# Patient Record
Sex: Female | Born: 1950
Health system: Southern US, Community
[De-identification: ages and names within clinical notes are randomized; demographics above are authoritative.]

## PROBLEM LIST (undated history)

## (undated) DIAGNOSIS — I428 Other cardiomyopathies: Secondary | ICD-10-CM

## (undated) DIAGNOSIS — E119 Type 2 diabetes mellitus without complications: Secondary | ICD-10-CM

## (undated) DIAGNOSIS — R002 Palpitations: Secondary | ICD-10-CM

## (undated) DIAGNOSIS — N183 Chronic kidney disease, stage 3 unspecified: Secondary | ICD-10-CM

## (undated) DIAGNOSIS — J309 Allergic rhinitis, unspecified: Secondary | ICD-10-CM

## (undated) DIAGNOSIS — E0500 Thyrotoxicosis with diffuse goiter without thyrotoxic crisis or storm: Secondary | ICD-10-CM

## (undated) DIAGNOSIS — E669 Obesity, unspecified: Secondary | ICD-10-CM

## (undated) DIAGNOSIS — G5600 Carpal tunnel syndrome, unspecified upper limb: Secondary | ICD-10-CM

## (undated) DIAGNOSIS — M1990 Unspecified osteoarthritis, unspecified site: Secondary | ICD-10-CM

## (undated) DIAGNOSIS — E785 Hyperlipidemia, unspecified: Secondary | ICD-10-CM

## (undated) DIAGNOSIS — I509 Heart failure, unspecified: Secondary | ICD-10-CM

## (undated) DIAGNOSIS — E0590 Thyrotoxicosis, unspecified without thyrotoxic crisis or storm: Secondary | ICD-10-CM

## (undated) DIAGNOSIS — I10 Essential (primary) hypertension: Secondary | ICD-10-CM

## (undated) DIAGNOSIS — I493 Ventricular premature depolarization: Secondary | ICD-10-CM

## (undated) HISTORY — DX: Essential (primary) hypertension: I10

## (undated) HISTORY — DX: Ventricular premature depolarization: I49.3

## (undated) HISTORY — DX: Carpal tunnel syndrome, unspecified upper limb: G56.00

## (undated) HISTORY — DX: Thyrotoxicosis, unspecified without thyrotoxic crisis or storm: E05.90

## (undated) HISTORY — DX: Obesity, unspecified: E66.9

## (undated) HISTORY — DX: Other cardiomyopathies: I42.8

## (undated) HISTORY — DX: Unspecified osteoarthritis, unspecified site: M19.90

## (undated) HISTORY — PX: CHOLECYSTECTOMY: SHX55

## (undated) HISTORY — DX: Heart failure, unspecified: I50.9

## (undated) HISTORY — DX: Type 2 diabetes mellitus without complications: E11.9

## (undated) HISTORY — PX: OTHER SURGICAL HISTORY: SHX169

## (undated) HISTORY — DX: Palpitations: R00.2

## (undated) HISTORY — DX: Hyperlipidemia, unspecified: E78.5

## (undated) HISTORY — DX: Allergic rhinitis, unspecified: J30.9

---

## 1998-10-14 ENCOUNTER — Emergency Department (HOSPITAL_COMMUNITY): Admission: EM | Admit: 1998-10-14 | Discharge: 1998-10-14 | Payer: Self-pay

## 2001-10-21 ENCOUNTER — Encounter: Payer: Self-pay | Admitting: Emergency Medicine

## 2001-10-21 ENCOUNTER — Emergency Department (HOSPITAL_COMMUNITY): Admission: EM | Admit: 2001-10-21 | Discharge: 2001-10-21 | Payer: Self-pay | Admitting: Emergency Medicine

## 2009-04-12 ENCOUNTER — Encounter: Payer: Self-pay | Admitting: Internal Medicine

## 2009-04-12 ENCOUNTER — Ambulatory Visit: Payer: Self-pay | Admitting: Cardiovascular Disease

## 2009-04-12 ENCOUNTER — Inpatient Hospital Stay (HOSPITAL_COMMUNITY): Admission: EM | Admit: 2009-04-12 | Discharge: 2009-04-20 | Payer: Self-pay | Admitting: Emergency Medicine

## 2009-04-12 LAB — CONVERTED CEMR LAB
HCT: 34.8 %
Hgb A1c MFr Bld: 6.6 %
Sodium: 141 meq/L
TSH: 0.008 microintl units/mL
WBC: 12.3 10*3/uL

## 2009-04-13 ENCOUNTER — Encounter: Payer: Self-pay | Admitting: Internal Medicine

## 2009-04-13 ENCOUNTER — Encounter (INDEPENDENT_AMBULATORY_CARE_PROVIDER_SITE_OTHER): Payer: Self-pay | Admitting: Internal Medicine

## 2009-04-13 LAB — CONVERTED CEMR LAB
Cholesterol: 102 mg/dL
LDL Cholesterol: 49 mg/dL
Triglyceride fasting, serum: 39 mg/dL

## 2009-04-16 ENCOUNTER — Encounter: Payer: Self-pay | Admitting: Internal Medicine

## 2009-04-16 LAB — CONVERTED CEMR LAB
Hemoglobin: 11.7 g/dL
WBC: 8.3 10*3/uL

## 2009-04-19 ENCOUNTER — Encounter: Payer: Self-pay | Admitting: Internal Medicine

## 2009-04-19 HISTORY — PX: CARDIAC CATHETERIZATION: SHX172

## 2009-04-19 LAB — CONVERTED CEMR LAB
Calcium: 8.8 mg/dL
Potassium: 5.1 meq/L

## 2009-04-20 ENCOUNTER — Encounter: Payer: Self-pay | Admitting: Internal Medicine

## 2009-04-20 LAB — CONVERTED CEMR LAB
HCT: 33.5 %
WBC: 5.5 10*3/uL

## 2009-05-03 ENCOUNTER — Encounter: Payer: Self-pay | Admitting: Cardiology

## 2009-05-11 ENCOUNTER — Encounter: Payer: Self-pay | Admitting: Internal Medicine

## 2009-05-11 LAB — CONVERTED CEMR LAB
AST: 20 units/L
Eosinophils Relative: 2.8 %
HCT: 37.3 %
Hemoglobin: 11.9 g/dL
Lymphocytes, automated: 27.7 %
Neutrophils Relative %: 60.5 %
Platelets: 211 10*3/uL
RBC: 4.97 M/uL
RDW: 17.1 %
Total Bilirubin: 0.8 mg/dL

## 2009-05-17 DIAGNOSIS — F458 Other somatoform disorders: Secondary | ICD-10-CM

## 2009-05-17 DIAGNOSIS — R0989 Other specified symptoms and signs involving the circulatory and respiratory systems: Secondary | ICD-10-CM

## 2009-05-17 DIAGNOSIS — R0609 Other forms of dyspnea: Secondary | ICD-10-CM

## 2009-05-17 DIAGNOSIS — I509 Heart failure, unspecified: Secondary | ICD-10-CM

## 2009-05-17 DIAGNOSIS — E0590 Thyrotoxicosis, unspecified without thyrotoxic crisis or storm: Secondary | ICD-10-CM

## 2009-05-17 DIAGNOSIS — I10 Essential (primary) hypertension: Secondary | ICD-10-CM

## 2009-05-17 DIAGNOSIS — I428 Other cardiomyopathies: Secondary | ICD-10-CM

## 2009-05-18 ENCOUNTER — Ambulatory Visit: Payer: Self-pay | Admitting: Cardiology

## 2009-05-25 ENCOUNTER — Telehealth: Payer: Self-pay | Admitting: Cardiology

## 2009-05-31 ENCOUNTER — Encounter: Admission: RE | Admit: 2009-05-31 | Discharge: 2009-05-31 | Payer: Self-pay | Admitting: Internal Medicine

## 2009-06-02 ENCOUNTER — Encounter: Payer: Self-pay | Admitting: Internal Medicine

## 2009-06-02 DIAGNOSIS — R002 Palpitations: Secondary | ICD-10-CM | POA: Insufficient documentation

## 2009-06-04 ENCOUNTER — Ambulatory Visit: Payer: Self-pay | Admitting: Internal Medicine

## 2009-06-04 DIAGNOSIS — E785 Hyperlipidemia, unspecified: Secondary | ICD-10-CM

## 2009-06-04 DIAGNOSIS — M1610 Unilateral primary osteoarthritis, unspecified hip: Secondary | ICD-10-CM | POA: Insufficient documentation

## 2009-06-04 DIAGNOSIS — E118 Type 2 diabetes mellitus with unspecified complications: Secondary | ICD-10-CM

## 2009-06-04 DIAGNOSIS — J309 Allergic rhinitis, unspecified: Secondary | ICD-10-CM | POA: Insufficient documentation

## 2009-06-04 DIAGNOSIS — E782 Mixed hyperlipidemia: Secondary | ICD-10-CM | POA: Insufficient documentation

## 2009-06-04 DIAGNOSIS — E1169 Type 2 diabetes mellitus with other specified complication: Secondary | ICD-10-CM

## 2009-06-10 ENCOUNTER — Encounter: Payer: Self-pay | Admitting: Internal Medicine

## 2009-06-10 LAB — CONVERTED CEMR LAB: TSH: 0.03 microintl units/mL

## 2009-06-17 ENCOUNTER — Encounter: Payer: Self-pay | Admitting: Internal Medicine

## 2009-07-06 ENCOUNTER — Encounter: Payer: Self-pay | Admitting: Internal Medicine

## 2009-09-20 ENCOUNTER — Ambulatory Visit: Payer: Self-pay | Admitting: Cardiology

## 2009-09-22 ENCOUNTER — Ambulatory Visit: Payer: Self-pay | Admitting: Internal Medicine

## 2009-09-22 DIAGNOSIS — G5600 Carpal tunnel syndrome, unspecified upper limb: Secondary | ICD-10-CM | POA: Insufficient documentation

## 2009-10-06 ENCOUNTER — Encounter: Payer: Self-pay | Admitting: Internal Medicine

## 2009-10-08 ENCOUNTER — Ambulatory Visit (HOSPITAL_COMMUNITY): Admission: RE | Admit: 2009-10-08 | Discharge: 2009-10-08 | Payer: Self-pay | Admitting: Cardiology

## 2009-10-08 ENCOUNTER — Ambulatory Visit: Payer: Self-pay | Admitting: Internal Medicine

## 2009-10-08 ENCOUNTER — Encounter: Payer: Self-pay | Admitting: Cardiology

## 2009-10-08 ENCOUNTER — Ambulatory Visit: Payer: Self-pay

## 2009-10-12 ENCOUNTER — Encounter: Payer: Self-pay | Admitting: Internal Medicine

## 2009-10-13 ENCOUNTER — Telehealth: Payer: Self-pay | Admitting: Internal Medicine

## 2009-10-13 ENCOUNTER — Encounter: Payer: Self-pay | Admitting: Internal Medicine

## 2009-11-22 ENCOUNTER — Telehealth: Payer: Self-pay | Admitting: Internal Medicine

## 2009-11-23 ENCOUNTER — Telehealth: Payer: Self-pay | Admitting: Internal Medicine

## 2009-11-30 ENCOUNTER — Ambulatory Visit: Payer: Self-pay | Admitting: Internal Medicine

## 2010-01-04 ENCOUNTER — Ambulatory Visit: Payer: Self-pay | Admitting: Internal Medicine

## 2010-01-04 ENCOUNTER — Ambulatory Visit: Payer: Self-pay | Admitting: Endocrinology

## 2010-01-05 LAB — CONVERTED CEMR LAB
Cholesterol: 150 mg/dL (ref 0–200)
HDL: 49.4 mg/dL (ref >39.00)
Hgb A1c MFr Bld: 5 % (ref 4.6–6.5)
LDL Cholesterol: 88 mg/dL (ref 0–99)
Total CHOL/HDL Ratio: 3
Triglycerides: 61 mg/dL (ref 0.0–149.0)

## 2010-03-06 LAB — CONVERTED CEMR LAB
BUN: 15 mg/dL (ref 6–23)
Calcium: 9 mg/dL (ref 8.4–10.5)
Chloride: 102 meq/L (ref 96–112)
Creatinine, Ser: 1.3 mg/dL — ABNORMAL HIGH (ref 0.4–1.2)
Glucose, Bld: 126 mg/dL — ABNORMAL HIGH (ref 70–99)
Glucose, Bld: 99 mg/dL (ref 70–99)
Potassium: 3.8 meq/L (ref 3.5–5.1)
Potassium: 4.5 meq/L (ref 3.5–5.1)
TSH: 0.86 microintl units/mL

## 2010-03-10 NOTE — Assessment & Plan Note (Signed)
Summary: eph/gd   History of Present Illness: 60 yo with history of obesity was admitted to [facility] in 3/11 with shortness of breath.  She was found to have a CHF exacerbation.  TSH was noted to be suppressed and free T4 was very high.  Patient was begun on methimazole and diuresed.  Echo was a difficult study but showed mild to moderately decreased EF.  Myoview showed EF 38% with multiple fixed deficits.  Left heart cath showed no significant coronary disease with EF 40%.  It was thought that her cardiomyopathy may be the result of hyperthyroidism.  Dr. Sharl Ma continues to treat her hyperthyroidism.  She is doing well at home. Her home health nurse has been measuring her oxygen saturation around 98% on room air.  She has been in a wheelchair when outside the house for a long time due to low back arthritis.  She has refused total hip replacement in the past.  She is able to walk in her house without dyspnea.  She is able to do laundary, wash dishes, and vacuum.  She is symptomatically much better compared to prior to her hospitalization.    Labs (3/11): K 4, creatinine 0.7, BNP 603=>230, free T4 3.2 (high), free T3 8.9 (high), TSH 0.008, LDL 49, HDL 45, HCT 33.5  ECG: NSR, normal  Current Medications (verified): 1)  Aspirin 81 Mg Tbec (Aspirin) .... Take One Tablet By Mouth Daily 2)  Furosemide 40 Mg Tabs (Furosemide) .... Take One Tablet By Mouth Daily. 3)  Enalapril Maleate 10 Mg Tabs (Enalapril Maleate) .... 1/2 Tab By Mouth Two Times A Day 4)  Carvedilol 25 Mg Tabs (Carvedilol) .... Take One Tablet By Mouth Twice A Day 5)  Lovastatin 10 Mg Tabs (Lovastatin) .[name].. 1 Tab Po Once Daily 6)  Methimazole 5 Mg Tabs (Methimazole) .... 4 Tabs Once Daily 7)  Multivitamins   Tabs (Multiple Vitamin) .[name].. 1 Tab By Mouth Once Daily 8)  Albuterol Sulfate (2.5 Mg/65ml) 0.083% Nebu (Albuterol Sulfate) .... As Needed  Allergies: 1)  ! * Hydrocodone/apap  Past History:  Past Medical History: 1. DM  (ICD-250.00): diet-controlled 2. HYPERTENSION (ICD-401.9) 3. HYPERTHYROIDISM (ICD-242.90) 4. CARDIOMYOPATHY (ICD-425.4): Nonischemic.  Admitted in 3/11 with CHF exacerbation, suspect it was due to hyperthyroidism.  LHC (3/11) showed clean coronaries with EF 40%.  Myoview showed EF 38%.  Echo was a technically difficult study with mild to moderately decreased EF, mild LVH, mild MR.  5. Osteoarthritis 6. Obesity 7. s/p CCY  Family History: No premature CAD  Social History: Lives in [place] with her son.  She is very sedentary, uses a wheelchair when out of the house.  No smoking or ETOH.   Review of Systems       All systems reviewed and negative except as per HPI.   Vital Signs:  Patient profile:   60 year old female Height:      66 inches Weight:      246 pounds BMI:     39.85 Resp:     14 per minute BP sitting:   108 / 50  (right arm)  Vitals Entered By: Kem Parkinson (May 18, 2009 8:22 AM)  Physical Exam  General:  Well developed, well nourished, in no acute distress.  Obese.  Head:  normocephalic and atraumatic Nose:  no deformity, discharge, inflammation, or lesions Mouth:  Teeth, gums and palate normal. Oral mucosa normal. Neck:  Neck supple, no JVD. No masses, thyromegaly or abnormal cervical nodes. Lungs:  Clear  bilaterally to auscultation and percussion. Heart:  Non-displaced PMI, chest non-tender; regular rate and rhythm, S1, S2 without murmurs, rubs or gallops. Carotid upstroke normal, no bruit. Pedals normal pulses. No edema, no varicosities. Abdomen:  Bowel sounds positive; abdomen soft and non-tender without masses, organomegaly, or hernias noted. No hepatosplenomegaly. Extremities:  No clubbing or cyanosis. Neurologic:  Alert and oriented x 3. Skin:  Intact without lesions or rashes. Psych:  Normal affect.   Impression & Recommendations:  Problem # 1:  CARDIOMYOPATHY (ICD-425.4) Nonischemic CMP likely due to hyperthyroidism with CHF.  She is now  well-diuresed and appears euvolemic.  She is sedentary but does not have significant exertional dyspnea now.  BMET/BNP today. Continue Coreg, enalapril, and current Lasix dose.  I will repeat an echo in 6 months to look for functional recovery now that thyroid is being treated.    Problem # 2:  OBESITY Patient is mainly limited by her hip pain, which prevents her from exercising.  She needs to diet and to move as much as possible.   Other Orders: Primary Care Referral (Primary) Echocardiogram (Echo) TLB-BMP (Basic Metabolic Panel-BMET) (80048-METABOL) TLB-BNP (B-Natriuretic Peptide) (83880-BNPR)  Patient Instructions: 1)  Your physician recommends that you have lab today--BMP/BNP 428.22 2)  You have been referred to primary care [facility] office. 3)  Your physician recommends that you schedule a follow-up appointment in 4 months with Dr Shirlee Latch. 4)  Your physician has requested that you have an echocardiogram.  Echocardiography is a painless test that uses sound waves to create images of your heart. It provides your doctor with information about the size and shape of your heart and how well your heart's chambers and valves are working.  This procedure takes approximately one hour. There are no restrictions for this procedure.  IN SIX MONTHS

## 2010-03-10 NOTE — Letter (Signed)
Summary: [facility]   Imported By: Sherian Rein 07/20/2009 10:21:57  _____________________________________________________________________  External Attachment:    Type:   Image     Comment:   External Document

## 2010-03-10 NOTE — Assessment & Plan Note (Signed)
Summary: 3 - 4 MTH FU---STC  SEEING SAE AT 10:30   Vital Signs:  Patient profile:   60 year old female Height:      66 inches (167.64 cm) Weight:      238 pounds (108.18 kg) O2 Sat:      98 % on Room air Temp:     98.4 degrees F (36.89 degrees C) oral Pulse rate:   70 / minute BP sitting:   112 / 62  (left arm) Cuff size:   large  Vitals Entered By: Orlan Leavens RMA (January 04, 2010 10:07 AM)  O2 Flow:  Room air CC: 3 month follow-up Is Patient Diabetic? Yes Did you bring your meter with you today? No Pain Assessment Patient in pain? no        Primary Care Provider:  Newt Lukes MD  CC:  3 month follow-up.  History of Present Illness: here for f/u  1) CHF - hosp 04/2009 with CHF -  s/p cardiac cath for same -  ongoing med mgmt without recurrent SOB or swelling - following with cards - reports 100% compliance with meds- recent reuction in diuretic d/t inc BUN/cr - but no inc edema orSOB since md change  2) hyperthyroidism - dx during 04/2009 hosp for chf now follows with endo - reports compliance with ongoing medical treatment and no changes in medication dose or frequency. denies adverse side effects related to current therapy.   3) htn - reports compliance with ongoing medical treatment and no changes in medication dose or frequency. denies adverse side effects related to current therapy.   4) dyslipdemia - reports compliance with ongoing medical treatment and no changes in medication dose or frequency. denies adverse side effects related to current therapy.   5) diet DM2 - likely related to hyperthyroid per pt - checks sugars 3x/week - this AM 117 - diet control only at this time   Clinical Review Panels:  Lipid Management   Cholesterol:  102 (04/13/2009)   LDL (bad choesterol):  49 (04/13/2009)   HDL (good cholesterol):  45 (04/13/2009)   Triglycerides:  39 (04/13/2009)  Diabetes Management   HgBA1C:  6.6 (04/12/2009)   Creatinine:  1.3  (09/20/2009)   Last Flu Vaccine:  Fluvax 3+ (09/22/2009)  CBC   WBC:  5.7 (05/11/2009)   RBC:  4.97 (05/11/2009)   Hgb:  11.9 (05/11/2009)   Hct:  37.3 (05/11/2009)   Platelets:  211 (05/11/2009)   MCV  74.9 (05/11/2009)   RDW  17.1 (05/11/2009)   PMN:  60.5 (05/11/2009)   Monos:  5.2 (05/11/2009)   Eosinophils:  2.80 (05/11/2009)   Basophil:  3.8 (05/11/2009)  Complete Metabolic Panel   Glucose:  99 (09/20/2009)   Sodium:  140 (09/20/2009)   Potassium:  4.5 (09/20/2009)   Chloride:  103 (09/20/2009)   CO2:  29 (09/20/2009)   BUN:  38 (09/20/2009)   Creatinine:  1.3 (09/20/2009)   Albumin:  3.8 (05/11/2009)   Total Protein:  7.5 (05/11/2009)   Calcium:  8.7 (09/20/2009)   Total Bili:  0.8 (05/11/2009)   Alk Phos:  91 (05/11/2009)   SGPT (ALT):  21 (05/11/2009)   SGOT (AST):  20 (05/11/2009)   Current Medications (verified): 1)  Aspirin 81 Mg Tbec (Aspirin) .... Take One Tablet By Mouth Daily 2)  Furosemide 40 Mg Tabs (Furosemide) .... One-Half Tablet Daily 3)  Enalapril Maleate 10 Mg Tabs (Enalapril Maleate) .... 1/2 Tab By Mouth Two Times A Day 4)  Carvedilol 25 Mg Tabs (Carvedilol) .... Take One Tablet By Mouth Twice A Day 5)  Lovastatin 10 Mg Tabs (Lovastatin) .[name].. 1 Tab Po Once Daily 6)  Methimazole 20 Mg Tabs (Methimazole) .... Take 1 By Mouth Qd 7)  Multivitamins   Tabs (Multiple Vitamin) .[name].. 1 Tab By Mouth Once Daily 8)  Albuterol Sulfate (2.5 Mg/17ml) 0.083% Nebu (Albuterol Sulfate) .... As Needed 9)  Claritin 10 Mg Tabs (Loratadine) .[name].. 1 By Mouth Once Daily As Needed For Allergy 10)  Diphenhydramine Hcl 25 Mg Caps (Diphenhydramine Hcl) .... Every 4 Hours As Needed For Allergies 11)  Aleve 220 Mg Caps (Naproxen Sodium) .[name].. 1 By Mouth Two Times A Day 12)  Psudatabs 30 Mg Tabs (Pseudoephedrine Hcl) .... Take 1 Two Times A Day As Needed  Allergies (verified): 1)  ! * Hydrocodone/apap  Past History:  Past Medical History: 1. DM (ICD-250.00):  diet-controlled 2. HYPERTENSION (ICD-401.9) 3. HYPERTHYROIDISM (ICD-242.90) 4. CARDIOMYOPATHY (ICD-425.4): Nonischemic.  Admitted in 3/11 with CHF exac, due to hyperthyroidism.  LHC (3/11) showed clean coronaries with EF 40%.  Myoview showed EF 38%.  Echo was a technically difficult study with mild to moderately decreased EF, mild LVH, mild MR.  5. Osteoarthritis - esp L hip, low back 6. Obesity 7. s/p CCY   MD roster: cards - Shirlee Latch endo - kerr  Review of Systems  The patient denies fever, weight loss, chest pain, abdominal pain, melena, and hematochezia.    Physical Exam  General:  overweight-appearing.  sitting in rolling WC - NAD Lungs:  normal respiratory effort, no intercostal retractions or use of accessory muscles; normal breath sounds bilaterally - no crackles and no wheezes.    Heart:  normal rate, regular rhythm, 2/6 murmur best heard with valsalva, and no rub. BLE without edema.  Abdomen:  obese, soft, non-tender, normal bowel sounds, no distention; no masses and no appreciable hepatomegaly or splenomegaly.     Impression & Recommendations:  Problem # 1:  DYSLIPIDEMIA (ICD-272.4)  Her updated medication list for this problem includes:    Lovastatin 10 Mg Tabs (Lovastatin) .[name]... 1 tab po once daily  Labs Reviewed: SGOT: 20 (05/11/2009)   SGPT: 21 (05/11/2009)   HDL:45 (04/13/2009)  LDL:49 (04/13/2009)  Chol:102 (04/13/2009)  Trig:39 (04/13/2009)  Orders: TLB-Lipid Panel (80061-LIPID)  Problem # 2:  DIABETES MELLITUS, CONTROLLED (ICD-250.00)  Her updated medication list for this problem includes:    Aspirin 81 Mg Tbec (Aspirin) .[name]... Take one tablet by mouth daily    Enalapril Maleate 10 Mg Tabs (Enalapril maleate) .[name]... 1/2 tab by mouth two times a day  Orders: TLB-A1C / Hgb A1C (Glycohemoglobin) (83036-A1C)  diet controlled  Labs Reviewed: Creat: 1.3 (09/20/2009)    Reviewed HgBA1c results: 6.6 (04/12/2009)  Problem # 3:  HYPERTENSION  (ICD-401.9)  Her updated medication list for this problem includes:    Furosemide 40 Mg Tabs (Furosemide) ..... One-half tablet daily    Enalapril Maleate 10 Mg Tabs (Enalapril maleate) .[name]... 1/2 tab by mouth two times a day    Carvedilol 25 Mg Tabs (Carvedilol) .[name]... Take one tablet by mouth twice a day  BP today: 112/62 Prior BP: 112/60 (09/22/2009)  Labs Reviewed: K+: 4.5 (09/20/2009) Creat: : 1.3 (09/20/2009)   Chol: 102 (04/13/2009)   HDL: 45 (04/13/2009)   LDL: 49 (04/13/2009)   TG: 39 (04/13/2009)  Complete Medication List: 1)  Aspirin 81 Mg Tbec (Aspirin) .... Take one tablet by mouth daily 2)  Furosemide 40 Mg Tabs (Furosemide) .... One-half  tablet daily 3)  Enalapril Maleate 10 Mg Tabs (Enalapril maleate) .... 1/2 tab by mouth two times a day 4)  Carvedilol 25 Mg Tabs (Carvedilol) .... Take one tablet by mouth twice a day 5)  Lovastatin 10 Mg Tabs (Lovastatin) .[name].. 1 tab po once daily 6)  Methimazole 20 Mg Tabs (methimazole)  .... Take 1 by mouth qd 7)  Multivitamins Tabs (Multiple vitamin) .[name].. 1 tab by mouth once daily 8)  Albuterol Sulfate (2.5 Mg/42ml) 0.083% Nebu (Albuterol sulfate) .... As needed 9)  Claritin 10 Mg Tabs (Loratadine) .[name].. 1 by mouth once daily as needed for allergy 10)  Diphenhydramine Hcl 25 Mg Caps (Diphenhydramine hcl) .... Every 4 hours as needed for allergies 11)  Aleve 220 Mg Caps (Naproxen sodium) .[name].. 1 by mouth two times a day 12)  Psudatabs 30 Mg Tabs (Pseudoephedrine hcl) .... Take 1 two times a day as needed  Patient Instructions: 1)  it was good to see you today. 2)  medications reviewed today -no changes 3)  test(s) ordered today - your results will be posted on the phone tree for review in 48-72 hours from the time of test completion; call [phone] and enter your 9 digit MRN (listed above on this page, just below your name); if any changes need to be made or there are abnormal results, you will be contacted directly.  4)  Please  schedule a follow-up appointment in 3-4 months to review, sooner if problems.  will plan to check labs at that visit for diabetes (ulness done elsewhere prior to that time)   Orders Added: 1)  TLB-Lipid Panel [80061-LIPID] 2)  TLB-A1C / Hgb A1C (Glycohemoglobin) [83036-A1C] 3)  Est. Patient Level IV [95621]

## 2010-03-10 NOTE — Progress Notes (Signed)
Summary: handicap form  Phone Note Outgoing Call   Call placed by: Orlan Leavens RMA,  October 13, 2009 11:21 AM Call placed to: mail back to pt Summary of Call: MD recieved handicapp forms in mail. MD completed form was mail back to pt with envelope that was addressed to. [address] [place] Initial call taken by: Orlan Leavens RMA,  October 13, 2009 11:22 AM

## 2010-03-10 NOTE — Assessment & Plan Note (Signed)
Summary: 4 month rov/sl   Visit Type:  Follow-up Primary Provider:  Newt Lukes MD  CC:  chest pressure-deep breathing.  History of Present Illness: 60 yo with history of obesity was admitted to [facility] in 3/11 with shortness of breath.  She was found to have a CHF exacerbation.  TSH was noted to be suppressed and free T4 was very high.  Patient was begun on methimazole and diuresed.  Echo was a difficult study but showed mild to moderately decreased EF.  Myoview showed EF 38% with multiple fixed deficits.  Left heart cath showed no significant coronary disease with EF 40%.  It was thought that her cardiomyopathy may be the result of hyperthyroidism.  Dr. Sharl Ma continues to treat her hyperthyroidism.  She is doing well at home. She has been in a wheelchair when outside the house for a long time due to low back and hip arthritis.  She has refused total hip replacement in the past.  She is able to walk in her house without dyspnea.  She is able to do laundary, wash dishes, and vacuum.  She is symptomatically much better compared to prior to her hospitalization.  She has lost 7 lbs since last appointment.   Labs (3/11): K 4, creatinine 0.7, BNP 603=>230, free T4 3.2 (high), free T3 8.9 (high), TSH 0.008, LDL 49, HDL 45, HCT 33.5 Labs (4/11): k 3.8, creatinine 0.8, BNP 47  ECG: NSR, normal  Current Medications (verified): 1)  Aspirin 81 Mg Tbec (Aspirin) .... Take One Tablet By Mouth Daily 2)  Furosemide 40 Mg Tabs (Furosemide) .... Take One Tablet By Mouth Daily. 3)  Enalapril Maleate 10 Mg Tabs (Enalapril Maleate) .... 1/2 Tab By Mouth Two Times A Day 4)  Carvedilol 25 Mg Tabs (Carvedilol) .... Take One Tablet By Mouth Twice A Day 5)  Lovastatin 10 Mg Tabs (Lovastatin) .[name].. 1 Tab Po Once Daily 6)  Methimazole 20 Mg Tabs (Methimazole) .... Take 1 By Mouth Qd 7)  Multivitamins   Tabs (Multiple Vitamin) .[name].. 1 Tab By Mouth Once Daily 8)  Albuterol Sulfate (2.5 Mg/33ml) 0.083% Nebu  (Albuterol Sulfate) .... As Needed 9)  Claritin 10 Mg Tabs (Loratadine) .[name].. 1 By Mouth Once Daily As Needed For Allergy 10)  Diphenhydramine Hcl 25 Mg Caps (Diphenhydramine Hcl) .... Every 4 Hours As Needed For Allergies 11)  Naprosyn 500 Mg Tabs (Naproxen) .... Otc  1 Tab Two Times A Day  Allergies (verified): 1)  ! * Hydrocodone/apap  Past History:  Past Medical History: Reviewed history from 06/04/2009 and no changes required. 1. DM (ICD-250.00): diet-controlled 2. HYPERTENSION (ICD-401.9) 3. HYPERTHYROIDISM (ICD-242.90) 4. CARDIOMYOPATHY (ICD-425.4): Nonischemic.  Admitted in 3/11 with CHF exac, due to hyperthyroidism.  LHC (3/11) showed clean coronaries with EF 40%.  Myoview showed EF 38%.  Echo was a technically difficult study with mild to moderately decreased EF, mild LVH, mild MR.  5. Osteoarthritis - esp L hip, low back 6. Obesity 7. s/p CCY  MD rooster: cards - [facility] endo - kerr  Family History: Reviewed history from 06/04/2009 and no changes required. No premature CAD mom - expired age 30y -  DM, breast cancer, cervical cancer dad - expired age 76y - CVA, kidney cancer  Social History: Reviewed history from 06/04/2009 and no changes required. Lives in [place] with her son.  widowed since 1992  She is very sedentary, uses a wheelchair when out of the house. ambulatory in home with cane   No smoking or  ETOH.   Review of Systems       All systems reviewed and negative except as per HPI.   Vital Signs:  Patient profile:   60 year old female Height:      66 inches Weight:      239 pounds BMI:     38.72 Pulse rate:   67 / minute BP sitting:   108 / 52  (left arm) Cuff size:   regular  Vitals Entered By: Burnett Kanaris, CNA (September 20, 2009 1:53 PM)  Physical Exam  General:  overweight-appearing.  sitting in rolling WC - NAD Neck:  Neck supple, no JVD. No masses, thyromegaly or abnormal cervical nodes. Lungs:  Clear bilaterally to auscultation and  percussion. Heart:  Non-displaced PMI, chest non-tender; regular rate and rhythm, S1, S2 without murmurs, rubs or gallops. Carotid upstroke normal, no bruit. Pedals normal pulses. No edema, no varicosities. Abdomen:  Bowel sounds positive; abdomen soft and non-tender without masses, organomegaly, or hernias noted. No hepatosplenomegaly. Extremities:  No clubbing or cyanosis. Neurologic:  Alert and oriented x 3. Psych:  Normal affect.   Impression & Recommendations:  Problem # 1:  CARDIOMYOPATHY (ICD-425.4) Nonischemic CMP likely due to hyperthyroidism with CHF.  She is now well-diuresed and appears euvolemic.  She is sedentary but does not have significant exertional dyspnea now.  Continue Coreg and enalapril.  Decrease Lasix to 20 mg daily.  I will repeat an echo in 9/11 look for functional recovery now that the thyroid is being treated.    Other Orders: Echocardiogram (Echo) TLB-BMP (Basic Metabolic Panel-BMET) (80048-METABOL)  Patient Instructions: 1)  Your physician recommends that you have lab today---BMP 428.22 2)  Your physician has requested that you have an echocardiogram.  Echocardiography is a painless test that uses sound waves to create images of your heart. It provides your doctor with information about the size and shape of your heart and how well your heart's chambers and valves are working.  This procedure takes approximately one hour. There are no restrictions for this procedure. SEPTEMBER 2011 3)  Your physician wants you to follow-up in: 6 months with Dr Shirlee Latch.  You will receive a reminder letter in the mail two months in advance. If you don't receive a letter, please call our office to schedule the follow-up appointment.

## 2010-03-10 NOTE — Assessment & Plan Note (Signed)
Summary: NEW ENDO PT--PER DR LESCHBER-DM II--[facility]   Vital Signs:  Patient profile:   60 year old female Height:      66 inches (167.64 cm) Weight:      238 pounds (108.18 kg) BMI:     38.55 O2 Sat:      98 % on Room air Temp:     98.4 degrees F (36.89 degrees C) oral Pulse rate:   70 / minute BP sitting:   112 / 62  (left arm) Cuff size:   large  Vitals Entered By: Brenton Grills CMA Duncan Dull) (January 04, 2010 10:59 AM)  O2 Flow:  Room air CC: New Endo Consult/DM and Thyroid/aj Is Patient Diabetic? Yes   Primary Provider:  Newt Lukes MD  CC:  New Endo Consult/DM and Thyroid/aj.  History of Present Illness: pt says she can no longer travel to dr Daune Perch office.  she was dx'ed with grave's dz in march of 2010, when she had chf.  she was rx'ed tapazole, with normalization of her tft.   she also took insulin in the hospital, but says her cbg's are well-controlled on no med for dm now.   symptomatically, she has 8 months of slight numbness of the hands, but no assoc pain.    Current Medications (verified): 1)  Aspirin 81 Mg Tbec (Aspirin) .... Take One Tablet By Mouth Daily 2)  Furosemide 40 Mg Tabs (Furosemide) .... One-Half Tablet Daily 3)  Enalapril Maleate 10 Mg Tabs (Enalapril Maleate) .... 1/2 Tab By Mouth Two Times A Day 4)  Carvedilol 25 Mg Tabs (Carvedilol) .... Take One Tablet By Mouth Twice A Day 5)  Lovastatin 10 Mg Tabs (Lovastatin) .[name].. 1 Tab Po Once Daily 6)  Methimazole 20 Mg Tabs (Methimazole) .... Take 1 By Mouth Qd 7)  Multivitamins   Tabs (Multiple Vitamin) .[name].. 1 Tab By Mouth Once Daily 8)  Albuterol Sulfate (2.5 Mg/80ml) 0.083% Nebu (Albuterol Sulfate) .... As Needed 9)  Claritin 10 Mg Tabs (Loratadine) .[name].. 1 By Mouth Once Daily As Needed For Allergy 10)  Diphenhydramine Hcl 25 Mg Caps (Diphenhydramine Hcl) .... Every 4 Hours As Needed For Allergies 11)  Aleve 220 Mg Caps (Naproxen Sodium) .[name].. 1 By Mouth Two Times A Day 12)  Psudatabs 30 Mg Tabs  (Pseudoephedrine Hcl) .... Take 1 Two Times A Day As Needed  Allergies (verified): 1)  ! * Hydrocodone/apap  Past History:  Past Medical History: Last updated: 01/04/2010 1. DM (ICD-250.00): diet-controlled 2. HYPERTENSION (ICD-401.9) 3. HYPERTHYROIDISM (ICD-242.90) 4. CARDIOMYOPATHY (ICD-425.4): Nonischemic.  Admitted in 3/11 with CHF exac, due to hyperthyroidism.  LHC (3/11) showed clean coronaries with EF 40%.  Myoview showed EF 38%.  Echo was a technically difficult study with mild to moderately decreased EF, mild LVH, mild MR.  5. Osteoarthritis - esp L hip, low back 6. Obesity 7. s/p CCY   MD roster: cards - [facility] endo - kerr  Social History: Reviewed history from 06/04/2009 and no changes required. Lives in [place] with her son.  widowed since 1992  She is very sedentary, uses a wheelchair when out of the house. ambulatory in home with cane   No smoking or ETOH.   Review of Systems       denies weight loss, headache, double vision, palpitations, sob, seizure, excessive diaphoresis, tremor, anxiety, and hypoglycemia.  she attributes slight hoarseness to talking to relatives during thanksgiving.  she has a few weeks of diarrhea alternating with constipation.  she attributes polyuria to lasix,  and arthralgias to oa. she has easy bruising and rhinorrhea.  Physical Exam  General:  morbidly obese.  in wheelchair.  no distress  Head:  head: no deformity eyes: no periorbital swelling, no proptosis external nose and ears are normal mouth: no lesion seen Neck:  thyroid is only slightly enlarged, slightly bigger on the right lobe, than the left.  no palpable nodule Lungs:  Clear to auscultation bilaterally. Normal respiratory effort.  Heart:  Regular rate and rhythm without murmurs or gallops noted. Normal S1,S2.   Msk:  muscle bulk and strength are grossly normal.  no obvious joint swelling.   Extremities:  no deformity no edema Neurologic:  cn 2-12 grossly intact.    readily moves all 4's.   sensation is intact to touch on the feet  Skin:  normal texture and temp.  no rash.  not diaphoretic  Cervical Nodes:  No significant adenopathy.  Psych:  Alert and cooperative; normal mood and affect; normal attention span and concentration.   Additional Exam:  outside test results are reviewed:  thyroid ultrasound: normal except for 1 tiny nodule.    today: Hemoglobin A1C            5.0 %                       4.6-6.5 FastTSH              [H]  8.84 uIU/mL     Impression & Recommendations:  Problem # 1:  HYPERTHYROIDISM (ICD-242.90) overcontrolled  Problem # 2:  DIABETES MELLITUS, CONTROLLED (ICD-250.00) Assessment: Improved  Problem # 3:  CONGESTIVE HEART FAILURE (ICD-428.0) this makes it risky to stop tapazole for i-131 rx  Medications Added to Medication List This Visit: 1)  Methimazole 10 Mg Tabs (Methimazole) .... 2 tabs once daily 2)  Methimazole 10 Mg Tabs (Methimazole) .[name].. 1 tab once daily  Other Orders: TLB-TSH (Thyroid Stimulating Hormone) (84443-TSH) Consultation Level IV (16109)  Patient Instructions: 1)  blood tests are being ordered for you today.  please call [phone] to hear your test results. 2)  pending the test results, please continue the same medications for now. 3)  if ever you have fever while taking methimazole, stop it and call us, because of the risk of a rare side-effect 4)  Please schedule a follow-up appointment in 3 months. 5)  (update: i left message on phone-tree:  stop tapazole x 1 week, then resume at 10 mg once daily).   Orders Added: 1)  TLB-TSH (Thyroid Stimulating Hormone) [84443-TSH] 2)  Consultation Level IV [81191]

## 2010-03-10 NOTE — Assessment & Plan Note (Signed)
Summary: new pt/[facility]/lb   Vital Signs:  Patient profile:   60 year old female Height:      66 inches (167.64 cm) Weight:      242.8 pounds (110.36 kg) O2 Sat:      96 % on Room air Temp:     98.6 degrees F (37.00 degrees C) oral Pulse rate:   94 / minute BP sitting:   122 / 62  (left arm) Cuff size:   regular  Vitals Entered By: Orlan Leavens (June 04, 2009 2:09 PM)  O2 Flow:  Room air CC: New patient/ D/C from hosp 04/20/09 Is Patient Diabetic? Yes Did you bring your meter with you today? No Pain Assessment Patient in pain? no        Primary Care Provider:  Newt Lukes MD  CC:  New patient/ D/C from hosp 04/20/09.  History of Present Illness: new pt to me and our division, here to est care  1) recent hosp 04/2009 with CHF -  s/p cardiac cath for same -  ongoing med mgmt without recurrent SOB or swelling - following with cards - reports 100% compliance with meds  2) hyperthyroidism - dx during recent hosp for chf now follows with endo - thyroid US done this week and meds recently adjusted  -  3) htn - reports compliance with ongoing medical treatment and no changes in medication dose or frequency. denies adverse side effects related to current therapy.   4) dyslipdemia - reports compliance with ongoing medical treatment and no changes in medication dose or frequency. denies adverse side effects related to current therapy.   5) diet DM2 - likely related to hyperthyroid per pt - checks sugars 3x/week - this AM 117 - diet control only at this time   Clinical Review Panels:  Lipid Management   Cholesterol:  102 (04/13/2009)   LDL (bad choesterol):  49 (04/13/2009)   HDL (good cholesterol):  45 (04/13/2009)   Triglycerides:  39 (04/13/2009)  Diabetes Management   HgBA1C:  6.6 (04/12/2009)   Creatinine:  0.8 (05/18/2009)  CBC   WBC:  5.5 (04/20/2009)   RBC:  4.55 (04/20/2009)   Hgb:  11.1 (04/20/2009)   Hct:  33.5 (04/20/2009)   Platelets:  242  (04/20/2009)   MCV  73.6 (04/20/2009)   RDW  16.9 (04/20/2009)  Complete Metabolic Panel   Glucose:  126 (05/18/2009)   Sodium:  142 (05/18/2009)   Potassium:  3.8 (05/18/2009)   Chloride:  102 (05/18/2009)   CO2:  31 (05/18/2009)   BUN:  15 (05/18/2009)   Creatinine:  0.8 (05/18/2009)   Calcium:  9.0 (05/18/2009)   Current Medications (verified): 1)  Aspirin 81 Mg Tbec (Aspirin) .... Take One Tablet By Mouth Daily 2)  Furosemide 40 Mg Tabs (Furosemide) .... Take One Tablet By Mouth Daily. 3)  Enalapril Maleate 10 Mg Tabs (Enalapril Maleate) .... 1/2 Tab By Mouth Two Times A Day 4)  Carvedilol 25 Mg Tabs (Carvedilol) .... Take One Tablet By Mouth Twice A Day 5)  Lovastatin 10 Mg Tabs (Lovastatin) .[name].. 1 Tab Po Once Daily 6)  Methimazole 20 Mg Tabs (Methimazole) .... Take 1 By Mouth Qd 7)  Multivitamins   Tabs (Multiple Vitamin) .[name].. 1 Tab By Mouth Once Daily 8)  Albuterol Sulfate (2.5 Mg/96ml) 0.083% Nebu (Albuterol Sulfate) .... As Needed  Allergies (verified): 1)  ! * Hydrocodone/apap  Past History:  Past Medical History: 1. DM (ICD-250.00): diet-controlled 2. HYPERTENSION (ICD-401.9) 3. HYPERTHYROIDISM (ICD-242.90)  4. CARDIOMYOPATHY (ICD-425.4): Nonischemic.  Admitted in 3/11 with CHF exac, due to hyperthyroidism.  LHC (3/11) showed clean coronaries with EF 40%.  Myoview showed EF 38%.  Echo was a technically difficult study with mild to moderately decreased EF, mild LVH, mild MR.  5. Osteoarthritis - esp L hip, low back 6. Obesity 7. s/p CCY  MD rooster: cards - [facility] - kerr  Past Surgical History: Denies surgical history  Family History: No premature CAD mom - expired age 59y -  DM, breast cancer, cervical cancer dad - expired age 51y - CVA, kidney cancer  Social History: Lives in [place] with her son.  widowed since 1992  She is very sedentary, uses a wheelchair when out of the house. ambulatory in home with cane   No smoking or ETOH.   Review  of Systems       see HPI above. I have reviewed all other systems and they were negative.   Physical Exam  General:  overweight-appearing.  sitting in rolling WC - NAD Eyes:  vision grossly intact; pupils equal, round and reactive to light.  conjunctiva and lids normal.    Ears:  normal pinnae bilaterally, without erythema, swelling, or tenderness to palpation. TMs clear, without effusion, or cerumen impaction. Hearing grossly normal bilaterally  Mouth:  teeth and gums in fair repair; mucous membranes moist, without lesions or ulcers. oropharynx clear without exudate, no erythema.  Neck:  supple, full ROM, no masses, no thyromegaly; no thyroid nodules or tenderness. no JVD or carotid bruits.   Lungs:  normal respiratory effort, no intercostal retractions or use of accessory muscles; normal breath sounds bilaterally - no crackles and no wheezes.    Heart:  normal rate, regular rhythm, no murmur, and no rub. BLE without edema.  Abdomen:  soft, non-tender, normal bowel sounds, no distention; no masses and no appreciable hepatomegaly or splenomegaly.   Msk:  No deformity or scoliosis noted of thoracic or lumbar spine.   Neurologic:  alert & oriented X3 and cranial nerves II-XII symetrically intact.  strength normal in all extremities, sensation intact to light touch; gait not normal. speech fluent without dysarthria or aphasia; follows commands with good comprehension.  Skin:  no rashes, vesicles, ulcers, or erythema. No nodules or irregularity to palpation.  Psych:  Oriented X3, memory intact for recent and remote, normally interactive, good eye contact, not anxious appearing, not depressed appearing, and not agitated.      Impression & Recommendations:  Problem # 1:  CARDIOMYOPATHY (ICD-425.4) per cards note 05/18/09: Nonischemic CMP likely due to hyperthyroidism with CHF.  She is now well-diuresed and appears euvolemic.  She is sedentary but does not have significant exertional dyspnea now.   Continue Coreg, enalapril, and current Lasix dose.  I will repeat an echo in 6 months to look for functional recovery now that thyroid is being treated.    Problem # 2:  CONGESTIVE HEART FAILURE (ICD-428.0)  see above - compensated at this time - cont same hosp records reviewed Her updated medication list for this problem includes:    Aspirin 81 Mg Tbec (Aspirin) .[name]... Take one tablet by mouth daily    Furosemide 40 Mg Tabs (Furosemide) .[name]... Take one tablet by mouth daily.    Enalapril Maleate 10 Mg Tabs (Enalapril maleate) .[name]... 1/2 tab by mouth two times a day    Carvedilol 25 Mg Tabs (Carvedilol) .[name]... Take one tablet by mouth twice a day  Echocardiogram: Impression: Left ventricular ejection fraction appears  decreased, abnormal septal motion. the cavity size was mildly dialated. Wall thickness was increased in the pattern of mild left ventricular hypertrophy. Mitral valve, mitral regugitation, left atrium was mildly dilated  (04/13/2009)  Problem # 3:  HYPERTHYROIDISM (ICD-242.90) now following with endo for same - dr.kerr recent US thyroid reviewed - cont med mgmt as ongoing Her updated medication list for this problem includes:    Carvedilol 25 Mg Tabs (Carvedilol) .[name]... Take one tablet by mouth twice a day  Labs Reviewed: TSH: 0.008 (04/12/2009)     Problem # 4:  HYPERTENSION (ICD-401.9)  Her updated medication list for this problem includes:    Furosemide 40 Mg Tabs (Furosemide) .[name]... Take one tablet by mouth daily.    Enalapril Maleate 10 Mg Tabs (Enalapril maleate) .[name]... 1/2 tab by mouth two times a day    Carvedilol 25 Mg Tabs (Carvedilol) .[name]... Take one tablet by mouth twice a day  BP today: 122/62 Prior BP: 108/50 (05/18/2009)  Labs Reviewed: K+: 3.8 (05/18/2009) Creat: : 0.8 (05/18/2009)   Chol: 102 (04/13/2009)   HDL: 45 (04/13/2009)   LDL: 49 (04/13/2009)   TG: 39 (04/13/2009)  Problem # 5:  DYSLIPIDEMIA (ICD-272.4)  Her updated medication list for this  problem includes:    Lovastatin 10 Mg Tabs (Lovastatin) .[name]... 1 tab po once daily    HDL:45 (04/13/2009)  LDL:49 (04/13/2009)  Chol:102 (04/13/2009)  Trig:39 (04/13/2009)  Problem # 6:  ALLERGIC RHINITIS (ICD-477.9)  Her updated medication list for this problem includes:    Claritin 10 Mg Tabs (Loratadine) .[name]... 1 by mouth once daily as needed for allergy    Diphenhydramine Hcl 25 Mg Caps (Diphenhydramine hcl) ..... Every 4 hours as needed for allergies  Discussed use of allergy medications and environmental measures.   Complete Medication List: 1)  Aspirin 81 Mg Tbec (Aspirin) .... Take one tablet by mouth daily 2)  Furosemide 40 Mg Tabs (Furosemide) .... Take one tablet by mouth daily. 3)  Enalapril Maleate 10 Mg Tabs (Enalapril maleate) .... 1/2 tab by mouth two times a day 4)  Carvedilol 25 Mg Tabs (Carvedilol) .... Take one tablet by mouth twice a day 5)  Lovastatin 10 Mg Tabs (Lovastatin) .[name].. 1 tab po once daily 6)  Methimazole 20 Mg Tabs (methimazole)  .... Take 1 by mouth qd 7)  Multivitamins Tabs (Multiple vitamin) .[name].. 1 tab by mouth once daily 8)  Albuterol Sulfate (2.5 Mg/48ml) 0.083% Nebu (Albuterol sulfate) .... As needed 9)  Claritin 10 Mg Tabs (Loratadine) .[name].. 1 by mouth once daily as needed for allergy 10)  Diphenhydramine Hcl 25 Mg Caps (Diphenhydramine hcl) .... Every 4 hours as needed for allergies  Patient Instructions: 1)  it was good to see you today. 2)  medications, labs and recent hospitalization reviewed today - 3)  try claritin or may continue benadryl as needed for allergy symptoms  4)  no medication changes - 5)  Please schedule a follow-up appointment in 3-4 months for review of blood pressure and medications, may call sooner if problems.

## 2010-03-10 NOTE — Assessment & Plan Note (Signed)
Summary: 3 mos f/u // # cd   Vital Signs:  Patient profile:   60 year old female Height:      66 inches (167.64 cm) Weight:      238.0 pounds (108.18 kg) O2 Sat:      96 % on Room air Temp:     97.1 degrees F (36.17 degrees C) oral Pulse rate:   76 / minute BP sitting:   112 / 60  (left arm) Cuff size:   large  Vitals Entered By: Orlan Leavens RMA (September 22, 2009 2:30 PM)  O2 Flow:  Room air CC: 3 month follow-up Is Patient Diabetic? Yes Did you bring your meter with you today? No Pain Assessment Patient in pain? no        Primary Care Krystena Reitter:  Newt Lukes MD  CC:  3 month follow-up.  History of Present Illness: here for f/u  1) CHF - hosp 04/2009 with CHF -  s/p cardiac cath for same -  ongoing med mgmt without recurrent SOB or swelling - following with cards - reports 100% compliance with meds- recent reuction in diuretic d/t inc BUN/cr - but no inc edema orSOB since md change  2) hyperthyroidism - dx during recent hosp for chf now follows with endo - reports compliance with ongoing medical treatment and no changes in medication dose or frequency. denies adverse side effects related to current therapy.   3) htn - reports compliance with ongoing medical treatment and no changes in medication dose or frequency. denies adverse side effects related to current therapy.   4) dyslipdemia - reports compliance with ongoing medical treatment and no changes in medication dose or frequency. denies adverse side effects related to current therapy.   5) diet DM2 - likely related to hyperthyroid per pt - checks sugars 3x/week - this AM 117 - diet control only at this time   Clinical Review Panels:  Diabetes Management   HgBA1C:  6.6 (04/12/2009)   Creatinine:  1.3 (09/20/2009)   Last Flu Vaccine:  Fluvax 3+ (09/22/2009)  CBC   WBC:  5.7 (05/11/2009)   RBC:  4.97 (05/11/2009)   Hgb:  11.9 (05/11/2009)   Hct:  37.3 (05/11/2009)   Platelets:  211 (05/11/2009)  MCV  74.9 (05/11/2009)   RDW  17.1 (05/11/2009)   PMN:  60.5 (05/11/2009)   Monos:  5.2 (05/11/2009)   Eosinophils:  2.80 (05/11/2009)   Basophil:  3.8 (05/11/2009)  Complete Metabolic Panel   Glucose:  99 (09/20/2009)   Sodium:  140 (09/20/2009)   Potassium:  4.5 (09/20/2009)   Chloride:  103 (09/20/2009)   CO2:  29 (09/20/2009)   BUN:  38 (09/20/2009)   Creatinine:  1.3 (09/20/2009)   Albumin:  3.8 (05/11/2009)   Total Protein:  7.5 (05/11/2009)   Calcium:  8.7 (09/20/2009)   Total Bili:  0.8 (05/11/2009)   Alk Phos:  91 (05/11/2009)   SGPT (ALT):  21 (05/11/2009)   SGOT (AST):  20 (05/11/2009)   Current Medications (verified): 1)  Aspirin 81 Mg Tbec (Aspirin) .... Take One Tablet By Mouth Daily 2)  Furosemide 40 Mg Tabs (Furosemide) .... One-Half Tablet Daily 3)  Enalapril Maleate 10 Mg Tabs (Enalapril Maleate) .... 1/2 Tab By Mouth Two Times A Day 4)  Carvedilol 25 Mg Tabs (Carvedilol) .... Take One Tablet By Mouth Twice A Day 5)  Lovastatin 10 Mg Tabs (Lovastatin) .[name].. 1 Tab Po Once Daily 6)  Methimazole 20 Mg Tabs (Methimazole) .... Take  1 By Mouth Qd 7)  Multivitamins   Tabs (Multiple Vitamin) .[name].. 1 Tab By Mouth Once Daily 8)  Albuterol Sulfate (2.5 Mg/22ml) 0.083% Nebu (Albuterol Sulfate) .... As Needed 9)  Claritin 10 Mg Tabs (Loratadine) .[name].. 1 By Mouth Once Daily As Needed For Allergy 10)  Diphenhydramine Hcl 25 Mg Caps (Diphenhydramine Hcl) .... Every 4 Hours As Needed For Allergies 11)  Naprosyn 500 Mg Tabs (Naproxen) .... Otc  1 Tab Two Times A Day  Allergies (verified): 1)  ! * Hydrocodone/apap  Past History:  Past Medical History: 1. DM (ICD-250.00): diet-controlled 2. HYPERTENSION (ICD-401.9) 3. HYPERTHYROIDISM (ICD-242.90) 4. CARDIOMYOPATHY (ICD-425.4): Nonischemic.  Admitted in 3/11 with CHF exac, due to hyperthyroidism.  LHC (3/11) showed clean coronaries with EF 40%.  Myoview showed EF 38%.  Echo was a technically difficult study with mild to  moderately decreased EF, mild LVH, mild MR.  5. Osteoarthritis - esp L hip, low back 6. Obesity 7. s/p CCY  MD roster: cards - [facility] endo - kerr  Review of Systems  The patient denies chest pain, dyspnea on exertion, peripheral edema, and headaches.         L>R hand asleep and "numb" upon waking  Physical Exam  General:  overweight-appearing.  sitting in rolling WC - NAD Lungs:  normal respiratory effort, no intercostal retractions or use of accessory muscles; normal breath sounds bilaterally - no crackles and no wheezes.    Heart:  normal rate, regular rhythm, 2/6 murmur best heard with valsalva, and no rub. BLE without edema.    Impression & Recommendations:  Problem # 1:  CONGESTIVE HEART FAILURE (ICD-428.0)  Her updated medication list for this problem includes:    Aspirin 81 Mg Tbec (Aspirin) .[name]... Take one tablet by mouth daily    Furosemide 40 Mg Tabs (Furosemide) ..... One-half tablet daily    Enalapril Maleate 10 Mg Tabs (Enalapril maleate) .[name]... 1/2 tab by mouth two times a day    Carvedilol 25 Mg Tabs (Carvedilol) .[name]... Take one tablet by mouth twice a day  compensated at this time; on reduced lasix x 1 week due to inc BUN/Cr - cont same  Problem # 2:  DIABETES MELLITUS, CONTROLLED (ICD-250.00)  diet controlled Her updated medication list for this problem includes:    Aspirin 81 Mg Tbec (Aspirin) .[name]... Take one tablet by mouth daily    Enalapril Maleate 10 Mg Tabs (Enalapril maleate) .[name]... 1/2 tab by mouth two times a day  Labs Reviewed: Creat: 1.3 (09/20/2009)    Reviewed HgBA1c results: 6.6 (04/12/2009)  Problem # 3:  DYSLIPIDEMIA (ICD-272.4)  Her updated medication list for this problem includes:    Lovastatin 10 Mg Tabs (Lovastatin) .[name]... 1 tab po once daily  Labs Reviewed: SGOT: 20 (05/11/2009)   SGPT: 21 (05/11/2009)   HDL:45 (04/13/2009)  LDL:49 (04/13/2009)  Chol:102 (04/13/2009)  Trig:39 (04/13/2009)  Problem # 4:  ARTHRITIS, LEFT HIP  (ICD-716.95) ok to use aleve as needed   Problem # 5:  CARPAL TUNNEL SYNDROME, LEFT, MILD (ICD-354.0)  dx by symptoms hx - try wrist spint at bedtime - provided today - pt to notify us if symptoms worse or unimproved with tx - can pursue PNCS as needed   Orders: Ankle / Wrist Splint ([id number])  Complete Medication List: 1)  Aspirin 81 Mg Tbec (Aspirin) .... Take one tablet by mouth daily 2)  Furosemide 40 Mg Tabs (Furosemide) .... One-half tablet daily 3)  Enalapril Maleate 10 Mg Tabs (Enalapril maleate) .Marland KitchenMarland KitchenMarland Kitchen  1/2 tab by mouth two times a day 4)  Carvedilol 25 Mg Tabs (Carvedilol) .... Take one tablet by mouth twice a day 5)  Lovastatin 10 Mg Tabs (Lovastatin) .[name].. 1 tab po once daily 6)  Methimazole 20 Mg Tabs (methimazole)  .... Take 1 by mouth qd 7)  Multivitamins Tabs (Multiple vitamin) .[name].. 1 tab by mouth once daily 8)  Albuterol Sulfate (2.5 Mg/47ml) 0.083% Nebu (Albuterol sulfate) .... As needed 9)  Claritin 10 Mg Tabs (Loratadine) .[name].. 1 by mouth once daily as needed for allergy 10)  Diphenhydramine Hcl 25 Mg Caps (Diphenhydramine hcl) .... Every 4 hours as needed for allergies 11)  Aleve 220 Mg Caps (Naproxen sodium) .[name].. 1 by mouth two times a day  Other Orders: Flu Vaccine 71yrs + (47829) Admin 1st Vaccine (56213)  Patient Instructions: 1)  it was good to see you today. 2)  labs and medications reviewed today -no changes 3)  try wearing wrist spint on left side at bedtime to see if this helps with carpal tunnel symptoms - let us know if symptoms worse or not improved for further eval and testing as needed  4)  flu shot today - 5)  Please schedule a follow-up appointment in 3-4 months to review, sooner if problems.  will plan to check labs at that visit for diabetes and cholesterol (ulness done elsewhere prior to that time)   Immunizations Administered:  Influenza Vaccine # 1:    Vaccine Type: Fluvax 3+    Site: right deltoid    Mfr: [email or web address]    Dose: 0.5 ml     Route: IM    Given by: Orlan Leavens RMA    Exp. Date: 08/06/2010    Lot #: [id number]    VIS given: 09/22/09  Flu Vaccine Consent Questions:    Do you have a history of severe allergic reactions to this vaccine? no    Any prior history of allergic reactions to egg and/or gelatin? no    Do you have a sensitivity to the preservative Thimersol? no    Do you have a past history of Guillan-Barre Syndrome? no    Do you currently have an acute febrile illness? no    Have you ever had a severe reaction to latex? no    Vaccine information given and explained to patient? yes    Are you currently pregnant? no

## 2010-03-10 NOTE — Progress Notes (Signed)
Summary: referral  Phone Note Call from Patient Call back at Home Phone [phone]   Caller: Patient Summary of Call: Pt called requesting referral to Dr Everardo All from Dr Sharl Ma to manage her TSH. Pt states she would prefer to have all her Dr in one practice. Initial call taken by: Margaret Pyle, CMA,  November 22, 2009 11:04 AM  Follow-up for Phone Call        we can make endo referral to [facility] as requested - cont to follow with kerr until appt est with [facility] - thanks Follow-up by: Newt Lukes MD,  November 22, 2009 12:28 PM  Additional Follow-up for Phone Call Additional follow up Details #1::        pt informed Additional Follow-up by: Margaret Pyle, CMA,  November 22, 2009 12:55 PM

## 2010-03-10 NOTE — Letter (Signed)
Summary: [employer] Application forms  Scat Application forms   Imported By: Sherian Rein 12/03/2009 07:04:43  _____________________________________________________________________  External Attachment:    Type:   Image     Comment:   External Document

## 2010-03-10 NOTE — Progress Notes (Signed)
Summary: allregies  Phone Note Call from Patient Call back at Home Phone [phone]   Caller: Patient Reason for Call: Talk to Nurse Summary of Call: calling concerning her allergies.... needs to find a allgery meds that will not interfer with her meds Initial call taken by: Migdalia Dk,  May 25, 2009 2:31 PM  Follow-up for Phone Call        pt asking if OK to use Benadryl as needed for allergies--reviewed with Dr Alannis Hsia--per Dr Wilmon Pali to use Benadryl as needed-pt aware

## 2010-03-10 NOTE — Letter (Signed)
Summary: Handicapped Placard/[facility]  Handicapped Placard/[facility]   Imported By: Lester [place] 10/15/2009 10:57:47  _____________________________________________________________________  External Attachment:    Type:   Image     Comment:   External Document

## 2010-03-10 NOTE — Miscellaneous (Signed)
Summary: Home Health Certification/Care Plan  Home Health Certification/Care Plan   Imported By: Roderic Ovens 05/12/2009 13:46:44  _____________________________________________________________________  External Attachment:    Type:   Image     Comment:   External Document

## 2010-03-10 NOTE — Progress Notes (Signed)
Summary: thyroid  Phone Note Call from Patient Call back at Home Phone [phone]   Caller: Patient Reason for Call: Talk to Nurse Summary of Call: Left msg on vm need to know if Dr. Everardo All need records from her previous md Dr. Sharl Ma. Pls return call Initial call taken by: Orlan Leavens RMA,  November 23, 2009 4:24 PM  Follow-up for Phone Call        Called pt back to let her know her last TSH labs from Dr. Sharl Ma is already in sxs. If Dr. Everardo All need anything else concerning thyroid will have her to sign medical release form then. Follow-up by: Orlan Leavens RMA,  November 23, 2009 4:30 PM

## 2010-03-21 ENCOUNTER — Other Ambulatory Visit (INDEPENDENT_AMBULATORY_CARE_PROVIDER_SITE_OTHER)

## 2010-03-21 ENCOUNTER — Encounter: Payer: Self-pay | Admitting: Cardiology

## 2010-03-21 ENCOUNTER — Ambulatory Visit (INDEPENDENT_AMBULATORY_CARE_PROVIDER_SITE_OTHER): Admitting: Cardiology

## 2010-03-21 ENCOUNTER — Other Ambulatory Visit: Payer: Self-pay | Admitting: Cardiology

## 2010-03-21 DIAGNOSIS — I5032 Chronic diastolic (congestive) heart failure: Secondary | ICD-10-CM

## 2010-03-21 DIAGNOSIS — I10 Essential (primary) hypertension: Secondary | ICD-10-CM

## 2010-03-21 LAB — BASIC METABOLIC PANEL
CO2: 25 mEq/L (ref 19–32)
Chloride: 102 mEq/L (ref 96–112)
Creatinine, Ser: 1.1 mg/dL (ref 0.4–1.2)
Potassium: 5 mEq/L (ref 3.5–5.1)
Sodium: 140 mEq/L (ref 135–145)

## 2010-03-23 ENCOUNTER — Telehealth: Payer: Self-pay | Admitting: Cardiology

## 2010-03-30 NOTE — Progress Notes (Signed)
Summary: Lab results  Phone Note Call from Patient Call back at Home Phone [phone]   Caller: Patient Reason for Call: Talk to Nurse, Lab or Test Results Summary of Call: pt rtn call re lab results Initial call taken by: Glynda Jaeger,  March 23, 2010 2:16 PM  Follow-up for Phone Call        [facility] Katina Dung, RN, BSN  March 23, 2010 5:13 PM   Additional Follow-up for Phone Call Additional follow up Details #1::        pt rtn call for 2nd time to get lab results Omer Jack  March 24, 2010 9:28 AM   I spoke with the pt about the results of her labwork.  The pt will decrease Lasix to every other day and have a BMP rechecked at the [facility] office on 04/05/10.    Additional Follow-up by: Julieta Gutting, RN, BSN,  March 24, 2010 9:38 AM    New/Updated Medications: FUROSEMIDE 40 MG TABS (FUROSEMIDE) one-half tablet every other day

## 2010-03-30 NOTE — Assessment & Plan Note (Signed)
Summary: 6 MONTH/D.MILLER/RS FROM BUMPLIST/GD/AMD   Primary Provider:  Newt Lukes MD   History of Present Illness: 60 yo with history of obesity was admitted to [facility] in 3/11 with shortness of breath.  She was found to have a CHF exacerbation.  TSH was noted to be suppressed and free T4 was very high.  Patient was begun on methimazole and diuresed.  Echo was a difficult study but showed mild to moderately decreased EF.  Myoview showed EF 38% with multiple fixed deficits.  Left heart cath showed no significant coronary disease with EF 40%.  It was thought that her cardiomyopathy may be the result of hyperthyroidism.  Hyperthyroidism is now being treated by Dr. Everardo All.  She is doing well symptomatically. She has been in a wheelchair when outside the house for a long time due to low back and hip arthritis.  She has refused total hip replacement in the past.  She is able to walk short distances in her house without dyspnea.  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  Unfortunately, she has gained significant weight since I last saw her, likely due to poor eating habits and lack of exercise.  Repeat echo in 9/11 showed EF 55-60% with mild diastolic dysfunction.   Labs (3/11): K 4, creatinine 0.7, BNP 603=>230, free T4 3.2 (high), free T3 8.9 (high), TSH 0.008, LDL 49, HDL 45, HCT 33.5 Labs (4/11): k 3.8, creatinine 0.8, BNP 47 Labs (8/11): creatinine 1.3  Current Medications (verified): 1)  Aspirin 81 Mg Tbec (Aspirin) .... Take One Tablet By Mouth Daily 2)  Furosemide 40 Mg Tabs (Furosemide) .... One-Half Tablet Daily 3)  Enalapril Maleate 10 Mg Tabs (Enalapril Maleate) .... 1/2 Tab By Mouth Two Times A Day 4)  Carvedilol 25 Mg Tabs (Carvedilol) .... Take One Tablet By Mouth Twice A Day 5)  Lovastatin 10 Mg Tabs (Lovastatin) .[name].. 1 Tab Po Once Daily 6)  Multivitamins   Tabs (Multiple Vitamin) .[name].. 1 Tab By Mouth Once Daily 7)  Albuterol Sulfate (2.5 Mg/5ml) 0.083% Nebu  (Albuterol Sulfate) .... As Needed 8)  Claritin 10 Mg Tabs (Loratadine) .[name].. 1 By Mouth Once Daily As Needed For Allergy 9)  Diphenhydramine Hcl 25 Mg Caps (Diphenhydramine Hcl) .... Every 4 Hours As Needed For Allergies 10)  Aleve 220 Mg Caps (Naproxen Sodium) .[name].. 1 By Mouth Two Times A Day 11)  Psudatabs 30 Mg Tabs (Pseudoephedrine Hcl) .... Take 1 Two Times A Day As Needed 12)  Methimazole 10 Mg Tabs (Methimazole) .[name].. 1 Tab Once Daily  Allergies (verified): 1)  ! * Hydrocodone/apap  Past History:  Past Medical History: 1. DM (ICD-250.00): diet-controlled 2. HYPERTENSION (ICD-401.9) 3. HYPERTHYROIDISM (ICD-242.90) 4. CARDIOMYOPATHY (ICD-425.4): Nonischemic.  Admitted in 3/11 with CHF exac, due to hyperthyroidism.  LHC (3/11) showed clean coronaries with EF 40%.  Myoview showed EF 38%.  Echo was a technically difficult study with mild to moderately decreased EF, mild LVH, mild MR.  Repeat echo (9/11) with EF 55-60%, mild diastolic dysfunction (grade I).  5. Osteoarthritis - esp L hip, low back 6. Obesity 7. s/p CCY   MD roster: cards - Shirlee Latch endo - kerr  Family History: Reviewed history from 06/04/2009 and no changes required. No premature CAD mom - expired age 67y -  DM, breast cancer, cervical cancer dad - expired age 29y - CVA, kidney cancer  Social History: Reviewed history from 06/04/2009 and no changes required. Lives in [place] with her son.  widowed  since 1992  She is very sedentary, uses a wheelchair when out of the house. ambulatory in home with cane   No smoking or ETOH.   Vital Signs:  Patient profile:   60 year old female Height:      66 inches Weight:      261 pounds BMI:     42.28 Pulse rate:   62 / minute Pulse rhythm:   regular BP sitting:   111 / 63  (left arm) Cuff size:   large  Vitals Entered By: Judithe Modest CMA (March 21, 2010 2:31 PM)  Physical Exam  General:  morbidly obese.  in wheelchair.  no distress  Neck:  Neck supple,  no JVD. No masses, thyromegaly or abnormal cervical nodes. Lungs:  Clear bilaterally to auscultation and percussion. Heart:  Non-displaced PMI, chest non-tender; regular rate and rhythm, S1, S2 without murmurs, rubs or gallops. Carotid upstroke normal, no bruit.  Pedals normal pulses. No edema, no varicosities. Abdomen:  Bowel sounds positive; abdomen soft and non-tender without masses, organomegaly, or hernias noted. No hepatosplenomegaly. Extremities:  No clubbing or cyanosis. Neurologic:  Alert and oriented x 3. Psych:  Normal affect.   Impression & Recommendations:  Problem # 1:  CARDIOMYOPATHY (ICD-425.4) Nonischemic CMP likely due to hyperthyroidism.  She is now well-diuresed and appears euvolemic.  Repeat echo showed that LV systolic function normalized with thyroid control (9/11).  She is sedentary but does not have significant exertional dyspnea now.  Continue Coreg and enalapril.  Will get BMET today.   Problem # 2:  OBESITY Morbidly obese.  Long discussion about dietary changes.   Patient Instructions: 1)  Your physician recommends that you have lab work done today.   2)  BMP 401.9 3)  Your physician recommends that you schedule a follow-up appointment in: 1 year with Dr. Shirlee Latch 4)  Your physician recommends that you continue on your current medications as directed. Please refer to the Current Medication list given to you today. Prescriptions: FUROSEMIDE 40 MG TABS (FUROSEMIDE) one-half tablet daily  #45 x `3   Entered by:   Judithe Modest CMA   Authorized by:   Marca Ancona, MD   Signed by:   Judithe Modest CMA on 03/21/2010   Method used:   Print then Give to Patient   RxID:   [id number] LOVASTATIN 10 MG TABS (LOVASTATIN) 1 tab po once daily  #90 x 3   Entered by:   Judithe Modest CMA   Authorized by:   Marca Ancona, MD   Signed by:   Judithe Modest CMA on 03/21/2010   Method used:   Print then Give to Patient   RxID:   [id number] CARVEDILOL 25 MG TABS  (CARVEDILOL) Take one tablet by mouth twice a day  #180 x 3   Entered by:   Judithe Modest CMA   Authorized by:   Marca Ancona, MD   Signed by:   Judithe Modest CMA on 03/21/2010   Method used:   Print then Give to Patient   RxID:   [id number] ENALAPRIL MALEATE 10 MG TABS (ENALAPRIL MALEATE) 1/2 tab by mouth two times a day  #90 x 3   Entered by:   Judithe Modest CMA   Authorized by:   Marca Ancona, MD   Signed by:   Judithe Modest CMA on 03/21/2010   Method used:   Print then Give to Patient   RxID:   [id number] FUROSEMIDE 40 MG TABS (FUROSEMIDE) one-half tablet daily  #45 x  3   Entered by:   Judithe Modest CMA   Authorized by:   Marca Ancona, MD   Signed by:   Judithe Modest CMA on 03/21/2010   Method used:   Electronically to        [facility] [address] (retail)       [id number]       [place], [place]  [address]       Ph: [phone]       Fax: [phone]   RxID:   [id number]

## 2010-04-05 ENCOUNTER — Encounter (INDEPENDENT_AMBULATORY_CARE_PROVIDER_SITE_OTHER): Payer: Self-pay | Admitting: *Deleted

## 2010-04-05 ENCOUNTER — Encounter: Payer: Self-pay | Admitting: Internal Medicine

## 2010-04-05 ENCOUNTER — Other Ambulatory Visit

## 2010-04-05 ENCOUNTER — Other Ambulatory Visit: Payer: Self-pay | Admitting: Endocrinology

## 2010-04-05 ENCOUNTER — Other Ambulatory Visit: Payer: Self-pay | Admitting: Cardiology

## 2010-04-05 ENCOUNTER — Ambulatory Visit (INDEPENDENT_AMBULATORY_CARE_PROVIDER_SITE_OTHER): Admitting: Endocrinology

## 2010-04-05 ENCOUNTER — Ambulatory Visit (INDEPENDENT_AMBULATORY_CARE_PROVIDER_SITE_OTHER): Admitting: Internal Medicine

## 2010-04-05 ENCOUNTER — Encounter: Payer: Self-pay | Admitting: Endocrinology

## 2010-04-05 DIAGNOSIS — E0590 Thyrotoxicosis, unspecified without thyrotoxic crisis or storm: Secondary | ICD-10-CM

## 2010-04-05 DIAGNOSIS — I10 Essential (primary) hypertension: Secondary | ICD-10-CM

## 2010-04-05 DIAGNOSIS — E119 Type 2 diabetes mellitus without complications: Secondary | ICD-10-CM

## 2010-04-05 DIAGNOSIS — I509 Heart failure, unspecified: Secondary | ICD-10-CM

## 2010-04-05 DIAGNOSIS — E785 Hyperlipidemia, unspecified: Secondary | ICD-10-CM

## 2010-04-05 DIAGNOSIS — M1610 Unilateral primary osteoarthritis, unspecified hip: Secondary | ICD-10-CM

## 2010-04-05 LAB — BASIC METABOLIC PANEL
Chloride: 107 mEq/L (ref 96–112)
Potassium: 5.5 mEq/L — ABNORMAL HIGH (ref 3.5–5.1)

## 2010-04-14 NOTE — Assessment & Plan Note (Signed)
Summary: 3 MTH FU--STC   Vital Signs:  Patient profile:   60 year old female Height:      66 inches Weight:      260.75 pounds BMI:     42.24 O2 Sat:      97 % on Room air Temp:     98.6 degrees F oral Pulse rate:   89 / minute BP sitting:   118 / 64  (left arm) Cuff size:   large  Vitals Entered By: Margaret Pyle, CMA (April 05, 2010 10:07 AM)  O2 Flow:  Room air CC: 3 mth DM follow up/DBD   Primary Provider:  Newt Lukes MD  CC:  3 mth DM follow up/DBD.  History of Present Illness: pt states she feels well in general, except for slight nasal congestion.  she takes the tapazole as rx'ed.    Allergies: 1)  ! * Hydrocodone/apap  Past History:  Past Medical History: Last updated: 03/21/2010 1. DM (ICD-250.00): diet-controlled 2. HYPERTENSION (ICD-401.9) 3. HYPERTHYROIDISM (ICD-242.90) 4. CARDIOMYOPATHY (ICD-425.4): Nonischemic.  Admitted in 3/11 with CHF exac, due to hyperthyroidism.  LHC (3/11) showed clean coronaries with EF 40%.  Myoview showed EF 38%.  Echo was a technically difficult study with mild to moderately decreased EF, mild LVH, mild MR.  Repeat echo (9/11) with EF 55-60%, mild diastolic dysfunction (grade I).  5. Osteoarthritis - esp L hip, low back 6. Obesity 7. s/p CCY   MD roster: cards - Shirlee Latch endo - kerr  Review of Systems  The patient denies fever.    Physical Exam  General:  morbidly obese.  in wheelchair.  no distress  Neck:  thyroid is only slightly enlarged, slightly bigger on the right lobe, than the left.  no palpable nodule. Additional Exam:  FastTSH                   1.66 uIU/mL                 0.35-5.50 Free T4                   0.84 ng/dL      Impression & Recommendations:  Problem # 1:  HYPERTHYROIDISM (ICD-242.90) well-controlled  Other Orders: TLB-TSH (Thyroid Stimulating Hormone) (84443-TSH) TLB-T4 (Thyrox), Free [phone]) Est. Patient Level III (78295)  Patient Instructions: 1)  blood tests  are being ordered for you today.  please call [phone] to hear your test results. 2)  Please schedule a follow-up appointment in 3 months. 3)  (update: i left message on phone-tree:  rx as we discussed)   Orders Added: 1)  TLB-TSH (Thyroid Stimulating Hormone) [84443-TSH] 2)  TLB-T4 (Thyrox), Free [57846-[id number]] 3)  Est. Patient Level III [52841]

## 2010-04-14 NOTE — Assessment & Plan Note (Signed)
Summary: 3-4 MTH FU--STC   Vital Signs:  Patient profile:   60 year old female O2 Sat:      97 % on Room air Temp:     98.6 degrees F (37.00 degrees C) oral Pulse rate:   89 / minute BP sitting:   118 / 64  (left arm) Cuff size:   large  Vitals Entered By: Orlan Leavens RMA (April 05, 2010 10:56 AM)  O2 Flow:  Room air CC: 3 month follow-up Is Patient Diabetic? Yes Did you bring your meter with you today? Yes Pain Assessment Patient in pain? no        Primary Care Provider:  Newt Lukes MD  CC:  3 month follow-up.  History of Present Illness: here for f/u  1) CHF - last hosp 04/2009 due to same  s/p cardiac cath for same -  ongoing med mgmt without recurrent SOB or swelling - following with cards - reports 100% compliance with meds- no inc edema orSOB since md change  2) hyperthyroidism - dx during 04/2009 hosp for chf- follows with endo - reports compliance with ongoing medical treatment and no changes in medication dose or frequency. denies adverse side effects related to current therapy.   3) htn - reports compliance with ongoing medical treatment and no changes in medication dose or frequency. denies adverse side effects related to current therapy.   4) dyslipdemia - reports compliance with ongoing medical treatment and no changes in medication dose or frequency. denies adverse side effects related to current therapy.   5) diet DM2 - likely related to hyperthyroid per pt - checks sugars 3x/week - this AM 117 - diet control only at this time    Clinical Review Panels:  Lipid Management   Cholesterol:  150 (01/04/2010)   LDL (bad choesterol):  88 (01/04/2010)   HDL (good cholesterol):  49.40 (01/04/2010)   Triglycerides:  39 (04/13/2009)  Diabetes Management   HgBA1C:  5.0 (01/04/2010)   Creatinine:  1.1 (03/21/2010)   Last Flu Vaccine:  Fluvax 3+ (09/22/2009)  CBC   WBC:  5.7 (05/11/2009)   RBC:  4.97 (05/11/2009)   Hgb:  11.9 (05/11/2009)  Hct:  37.3 (05/11/2009)   Platelets:  211 (05/11/2009)   MCV  74.9 (05/11/2009)   RDW  17.1 (05/11/2009)   PMN:  60.5 (05/11/2009)   Monos:  5.2 (05/11/2009)   Eosinophils:  2.80 (05/11/2009)   Basophil:  3.8 (05/11/2009)  Complete Metabolic Panel   Glucose:  86 (03/21/2010)   Sodium:  140 (03/21/2010)   Potassium:  5.0 (03/21/2010)   Chloride:  102 (03/21/2010)   CO2:  25 (03/21/2010)   BUN:  40 (03/21/2010)   Creatinine:  1.1 (03/21/2010)   Albumin:  3.8 (05/11/2009)   Total Protein:  7.5 (05/11/2009)   Calcium:  8.6 (03/21/2010)   Total Bili:  0.8 (05/11/2009)   Alk Phos:  91 (05/11/2009)   SGPT (ALT):  21 (05/11/2009)   SGOT (AST):  20 (05/11/2009)   Current Medications (verified): 1)  Aspirin 81 Mg Tbec (Aspirin) .... Take One Tablet By Mouth Daily 2)  Furosemide 40 Mg Tabs (Furosemide) .... One-Half Tablet Every Other Day 3)  Enalapril Maleate 10 Mg Tabs (Enalapril Maleate) .... 1/2 Tab By Mouth Two Times A Day 4)  Carvedilol 25 Mg Tabs (Carvedilol) .... Take One Tablet By Mouth Twice A Day 5)  Lovastatin 10 Mg Tabs (Lovastatin) .[name].. 1 Tab Po Once Daily 6)  Multivitamins   Tabs (Multiple  Vitamin) .[name].. 1 Tab By Mouth Once Daily 7)  Albuterol Sulfate (2.5 Mg/4ml) 0.083% Nebu (Albuterol Sulfate) .... As Needed 8)  Claritin 10 Mg Tabs (Loratadine) .[name].. 1 By Mouth Once Daily As Needed For Allergy 9)  Diphenhydramine Hcl 25 Mg Caps (Diphenhydramine Hcl) .... Every 4 Hours As Needed For Allergies 10)  Aleve 220 Mg Caps (Naproxen Sodium) .[name].. 1 By Mouth Two Times A Day 11)  Psudatabs 30 Mg Tabs (Pseudoephedrine Hcl) .... Take 1 Two Times A Day As Needed 12)  Methimazole 10 Mg Tabs (Methimazole) .[name].. 1 Tab Once Daily  Allergies (verified): 1)  ! * Hydrocodone/apap  Past History:  Past Medical History: 1. DM (ICD-250.00): diet-controlled 2. HYPERTENSION (ICD-401.9) 3. HYPERTHYROIDISM (ICD-242.90) 4. CARDIOMYOPATHY (ICD-425.4): Nonischemic.  Admitted in 3/11 with CHF exac,  due to hyperthyroidism.  LHC (3/11) showed clean coronaries with EF 40%.  Myoview showed EF 38%.  Echo was a technically difficult study with mild to moderately decreased EF, mild LVH, mild MR.  Repeat echo (9/11) with EF 55-60%, mild diastolic dysfunction (grade I).  5. Osteoarthritis - esp L hip, low back 6. Obesity 7. s/p CCY   MD roster: cards - Shirlee Latch endo - ellison  Review of Systems  The patient denies chest pain, syncope, headaches, and abdominal pain.    Physical Exam  General:  overweight-appearing.  sitting in rolling WC - NAD Ears:  R ear normal and L ear normal.   Mouth:  teeth and gums in fair repair; mucous membranes moist, without lesions or ulcers. oropharynx clear without exudate, no erythema.  Lungs:  normal respiratory effort, no intercostal retractions or use of accessory muscles; normal breath sounds bilaterally - no crackles and no wheezes.    Heart:  normal rate, regular rhythm, 2/6 murmur best heard with valsalva, and no rub. BLE without edema.  Psych:  Oriented X3, memory intact for recent and remote, normally interactive, good eye contact, not anxious appearing, not depressed appearing, and not agitated.      Impression & Recommendations:  Problem # 1:  DIABETES MELLITUS, CONTROLLED (ICD-250.00)  diet only control - watch a1c periodically Her updated medication list for this problem includes:    Aspirin 81 Mg Tbec (Aspirin) .[name]... Take one tablet by mouth daily    Enalapril Maleate 10 Mg Tabs (Enalapril maleate) .[name]... 1/2 tab by mouth two times a day  Labs Reviewed: Creat: 1.1 (03/21/2010)    Reviewed HgBA1c results: 5.0 (01/04/2010)  6.6 (04/12/2009)  Problem # 2:  DYSLIPIDEMIA (ICD-272.4)  Her updated medication list for this problem includes:    Lovastatin 10 Mg Tabs (Lovastatin) .[name]... 1 tab po once daily  Labs Reviewed: SGOT: 20 (05/11/2009)   SGPT: 21 (05/11/2009)   HDL:49.40 (01/04/2010), 45 (04/13/2009)  LDL:88 (01/04/2010), 49 (04/13/2009)   Chol:150 (01/04/2010), 102 (04/13/2009)  Trig:61.0 (01/04/2010), 39 (04/13/2009)  Problem # 3:  ARTHRITIS, LEFT HIP (ICD-716.95)  ok to use aleve as needed -  cardiac risks of NSAIDs reviewed and felt by pt and myself to not outweigh the beneficial relief of pain  Complete Medication List: 1)  Aspirin 81 Mg Tbec (Aspirin) .... Take one tablet by mouth daily 2)  Furosemide 40 Mg Tabs (Furosemide) .... One-half tablet every other day 3)  Enalapril Maleate 10 Mg Tabs (Enalapril maleate) .... 1/2 tab by mouth two times a day 4)  Carvedilol 25 Mg Tabs (Carvedilol) .... Take one tablet by mouth twice a day 5)  Lovastatin 10 Mg Tabs (Lovastatin) .[name].. 1 tab po  once daily 6)  Multivitamins Tabs (Multiple vitamin) .[name].. 1 tab by mouth once daily 7)  Albuterol Sulfate (2.5 Mg/25ml) 0.083% Nebu (Albuterol sulfate) .... As needed 8)  Claritin 10 Mg Tabs (Loratadine) .[name].. 1 by mouth once daily as needed for allergy 9)  Diphenhydramine Hcl 25 Mg Caps (Diphenhydramine hcl) .... Every 4 hours as needed for allergies 10)  Aleve 220 Mg Caps (Naproxen sodium) .[name].. 1 by mouth two times a day 11)  Psudatabs 30 Mg Tabs (Pseudoephedrine hcl) .... Take 1 two times a day as needed 12)  Methimazole 10 Mg Tabs (Methimazole) .[name].. 1 tab once daily  Patient Instructions: 1)  it was good to see you today. 2)  medications reviewed today -no changes - ok to use aleve as needed if helpful for your arthritis 3)  Please schedule a follow-up appointment in 3-4 months to review DM, cholesterol, weight and BP; call sooner if problems.     Orders Added: 1)  Est. Patient Level III [16109]

## 2010-04-18 ENCOUNTER — Other Ambulatory Visit

## 2010-04-19 ENCOUNTER — Emergency Department (HOSPITAL_COMMUNITY)

## 2010-04-19 ENCOUNTER — Emergency Department (HOSPITAL_COMMUNITY)
Admission: EM | Admit: 2010-04-19 | Discharge: 2010-04-19 | Disposition: A | Discharge status: Home / Self Care | Attending: Emergency Medicine | Admitting: Emergency Medicine

## 2010-04-19 DIAGNOSIS — R51 Headache: Secondary | ICD-10-CM | POA: Insufficient documentation

## 2010-04-19 DIAGNOSIS — M542 Cervicalgia: Secondary | ICD-10-CM | POA: Insufficient documentation

## 2010-04-19 DIAGNOSIS — W010XXA Fall on same level from slipping, tripping and stumbling without subsequent striking against object, initial encounter: Secondary | ICD-10-CM | POA: Insufficient documentation

## 2010-04-19 DIAGNOSIS — I509 Heart failure, unspecified: Secondary | ICD-10-CM | POA: Insufficient documentation

## 2010-04-19 DIAGNOSIS — I10 Essential (primary) hypertension: Secondary | ICD-10-CM | POA: Insufficient documentation

## 2010-04-19 DIAGNOSIS — M25559 Pain in unspecified hip: Secondary | ICD-10-CM | POA: Insufficient documentation

## 2010-04-19 DIAGNOSIS — Z79899 Other long term (current) drug therapy: Secondary | ICD-10-CM | POA: Insufficient documentation

## 2010-04-19 DIAGNOSIS — S42409A Unspecified fracture of lower end of unspecified humerus, initial encounter for closed fracture: Secondary | ICD-10-CM | POA: Insufficient documentation

## 2010-04-19 DIAGNOSIS — M545 Low back pain, unspecified: Secondary | ICD-10-CM | POA: Insufficient documentation

## 2010-04-19 DIAGNOSIS — S0990XA Unspecified injury of head, initial encounter: Secondary | ICD-10-CM | POA: Insufficient documentation

## 2010-04-19 DIAGNOSIS — E119 Type 2 diabetes mellitus without complications: Secondary | ICD-10-CM | POA: Insufficient documentation

## 2010-04-20 ENCOUNTER — Other Ambulatory Visit

## 2010-04-20 ENCOUNTER — Telehealth: Payer: Self-pay | Admitting: Internal Medicine

## 2010-04-21 ENCOUNTER — Other Ambulatory Visit: Payer: Self-pay | Admitting: Cardiology

## 2010-04-21 ENCOUNTER — Encounter: Payer: Self-pay | Admitting: Cardiology

## 2010-04-21 ENCOUNTER — Other Ambulatory Visit (INDEPENDENT_AMBULATORY_CARE_PROVIDER_SITE_OTHER)

## 2010-04-21 DIAGNOSIS — I509 Heart failure, unspecified: Secondary | ICD-10-CM

## 2010-04-22 LAB — BASIC METABOLIC PANEL
BUN: 39 mg/dL — ABNORMAL HIGH (ref 6–23)
CO2: 25 mEq/L (ref 19–32)
Calcium: 8.6 mg/dL (ref 8.4–10.5)
Chloride: 109 mEq/L (ref 96–112)
Creatinine, Ser: 1.3 mg/dL — ABNORMAL HIGH (ref 0.4–1.2)
GFR: 46.37 mL/min — ABNORMAL LOW (ref >60.00)
Glucose, Bld: 101 mg/dL — ABNORMAL HIGH (ref 70–99)
Potassium: 5.2 mEq/L — ABNORMAL HIGH (ref 3.5–5.1)
Sodium: 138 mEq/L (ref 135–145)

## 2010-04-26 NOTE — Progress Notes (Signed)
Summary: Rx req-Broken Elbow  Phone Note Call from Patient Call back at Home Phone [phone]   Caller: Patient Summary of Call: Pt called stating that she fell yesterday and was seen in [facility] ER. Pt was told she has broken her elbow and has an appt with [facility] 03/16. Pt was told in the ER that she would be Rx'd Vicodin but when she declined she was offered Tramadol but was never given the prescription. Pt is requesting limited quantity until Ortho appt Friday, please advise. [facility] [address] [place] Initial call taken by: Margaret Pyle, CMA,  April 20, 2010 11:34 AM  Follow-up for Phone Call        tramadol erx done - thanks for the update! Follow-up by: Newt Lukes MD,  April 20, 2010 1:03 PM  Additional Follow-up for Phone Call Additional follow up Details #1::        pt advised Additional Follow-up by: Margaret Pyle, CMA,  April 20, 2010 1:17 PM    New/Updated Medications: TRAMADOL HCL 50 MG TABS (TRAMADOL HCL) 1-2 by mouth every 6h as needed for pain symptoms Prescriptions: TRAMADOL HCL 50 MG TABS (TRAMADOL HCL) 1-2 by mouth every 6h as needed for pain symptoms  #40 x 0   Entered and Authorized by:   Newt Lukes MD   Signed by:   Newt Lukes MD on 04/20/2010   Method used:   Electronically to        [facility] (retail)       [address]       [place], [place]  [address]       Ph: [phone]       Fax: [phone]   RxID:   [id number]

## 2010-05-02 LAB — CBC
HCT: 34.8 % — ABNORMAL LOW (ref 36.0–46.0)
HCT: 35.8 % — ABNORMAL LOW (ref 36.0–46.0)
Hemoglobin: 11 g/dL — ABNORMAL LOW (ref 12.0–15.0)
Hemoglobin: 11.5 g/dL — ABNORMAL LOW (ref 12.0–15.0)
Hemoglobin: 11.7 g/dL — ABNORMAL LOW (ref 12.0–15.0)
MCHC: 33 g/dL (ref 30.0–36.0)
MCHC: 33.3 g/dL (ref 30.0–36.0)
MCV: 73.6 fL — ABNORMAL LOW (ref 78.0–100.0)
MCV: 73.7 fL — ABNORMAL LOW (ref 78.0–100.0)
MCV: 73.7 fL — ABNORMAL LOW (ref 78.0–100.0)
MCV: 73.8 fL — ABNORMAL LOW (ref 78.0–100.0)
MCV: 74 fL — ABNORMAL LOW (ref 78.0–100.0)
Platelets: 252 10*3/uL (ref 150–400)
Platelets: 284 10*3/uL (ref 150–400)
Platelets: 323 10*3/uL (ref 150–400)
RBC: 4.21 MIL/uL (ref 3.87–5.11)
RBC: 4.55 MIL/uL (ref 3.87–5.11)
RBC: 4.69 MIL/uL (ref 3.87–5.11)
RBC: 4.82 MIL/uL (ref 3.87–5.11)
RDW: 16.9 % — ABNORMAL HIGH (ref 11.5–15.5)
RDW: 17 % — ABNORMAL HIGH (ref 11.5–15.5)
WBC: 5.5 10*3/uL (ref 4.0–10.5)
WBC: 8.3 10*3/uL (ref 4.0–10.5)
WBC: 8.9 10*3/uL (ref 4.0–10.5)

## 2010-05-02 LAB — BASIC METABOLIC PANEL
BUN: 16 mg/dL (ref 6–23)
BUN: 20 mg/dL (ref 6–23)
CO2: 27 mEq/L (ref 19–32)
CO2: 28 mEq/L (ref 19–32)
CO2: 28 mEq/L (ref 19–32)
CO2: 28 mEq/L (ref 19–32)
Calcium: 8.4 mg/dL (ref 8.4–10.5)
Calcium: 8.6 mg/dL (ref 8.4–10.5)
Calcium: 8.6 mg/dL (ref 8.4–10.5)
Chloride: 100 mEq/L (ref 96–112)
Chloride: 103 mEq/L (ref 96–112)
Chloride: 99 mEq/L (ref 96–112)
Creatinine, Ser: 0.54 mg/dL (ref 0.4–1.2)
Creatinine, Ser: 0.63 mg/dL (ref 0.4–1.2)
Creatinine, Ser: 0.7 mg/dL (ref 0.4–1.2)
Creatinine, Ser: 0.73 mg/dL (ref 0.4–1.2)
GFR calc Af Amer: >60 mL/min (ref >60)
GFR calc Af Amer: >60 mL/min (ref >60)
GFR calc Af Amer: >60 mL/min (ref >60)
GFR calc non Af Amer: >60 mL/min (ref >60)
GFR calc non Af Amer: >60 mL/min (ref >60)
GFR calc non Af Amer: >60 mL/min (ref >60)
GFR calc non Af Amer: >60 mL/min (ref >60)
Glucose, Bld: 106 mg/dL — ABNORMAL HIGH (ref 70–99)
Glucose, Bld: 153 mg/dL — ABNORMAL HIGH (ref 70–99)
Potassium: 4 mEq/L (ref 3.5–5.1)
Potassium: 4.6 mEq/L (ref 3.5–5.1)
Sodium: 135 mEq/L (ref 135–145)
Sodium: 135 mEq/L (ref 135–145)
Sodium: 137 mEq/L (ref 135–145)

## 2010-05-02 LAB — GLUCOSE, CAPILLARY
Glucose-Capillary: 102 mg/dL — ABNORMAL HIGH (ref 70–99)
Glucose-Capillary: 102 mg/dL — ABNORMAL HIGH (ref 70–99)
Glucose-Capillary: 103 mg/dL — ABNORMAL HIGH (ref 70–99)
Glucose-Capillary: 104 mg/dL — ABNORMAL HIGH (ref 70–99)
Glucose-Capillary: 127 mg/dL — ABNORMAL HIGH (ref 70–99)
Glucose-Capillary: 135 mg/dL — ABNORMAL HIGH (ref 70–99)
Glucose-Capillary: 135 mg/dL — ABNORMAL HIGH (ref 70–99)
Glucose-Capillary: 143 mg/dL — ABNORMAL HIGH (ref 70–99)
Glucose-Capillary: 155 mg/dL — ABNORMAL HIGH (ref 70–99)
Glucose-Capillary: 159 mg/dL — ABNORMAL HIGH (ref 70–99)
Glucose-Capillary: 182 mg/dL — ABNORMAL HIGH (ref 70–99)
Glucose-Capillary: 71 mg/dL (ref 70–99)
Glucose-Capillary: 96 mg/dL (ref 70–99)

## 2010-05-02 LAB — COMPREHENSIVE METABOLIC PANEL
ALT: 46 U/L — ABNORMAL HIGH (ref 0–35)
ALT: 76 U/L — ABNORMAL HIGH (ref 0–35)
AST: 114 U/L — ABNORMAL HIGH (ref 0–37)
AST: 33 U/L (ref 0–37)
AST: 72 U/L — ABNORMAL HIGH (ref 0–37)
Albumin: 3.2 g/dL — ABNORMAL LOW (ref 3.5–5.2)
BUN: 10 mg/dL (ref 6–23)
CO2: 25 mEq/L (ref 19–32)
CO2: 25 mEq/L (ref 19–32)
CO2: 28 mEq/L (ref 19–32)
Calcium: 8.6 mg/dL (ref 8.4–10.5)
Calcium: 8.7 mg/dL (ref 8.4–10.5)
Chloride: 101 mEq/L (ref 96–112)
Chloride: 105 mEq/L (ref 96–112)
Creatinine, Ser: 0.51 mg/dL (ref 0.4–1.2)
Creatinine, Ser: 0.53 mg/dL (ref 0.4–1.2)
Creatinine, Ser: 0.66 mg/dL (ref 0.4–1.2)
GFR calc Af Amer: >60 mL/min (ref >60)
GFR calc Af Amer: >60 mL/min (ref >60)
GFR calc non Af Amer: >60 mL/min (ref >60)
GFR calc non Af Amer: >60 mL/min (ref >60)
Glucose, Bld: 118 mg/dL — ABNORMAL HIGH (ref 70–99)
Glucose, Bld: 147 mg/dL — ABNORMAL HIGH (ref 70–99)
Glucose, Bld: 197 mg/dL — ABNORMAL HIGH (ref 70–99)
Sodium: 139 mEq/L (ref 135–145)
Total Bilirubin: 0.8 mg/dL (ref 0.3–1.2)
Total Bilirubin: 1 mg/dL (ref 0.3–1.2)
Total Bilirubin: 1.3 mg/dL — ABNORMAL HIGH (ref 0.3–1.2)

## 2010-05-02 LAB — LIPID PANEL
Cholesterol: 102 mg/dL (ref 0–200)
HDL: 45 mg/dL (ref >39)
Triglycerides: 39 mg/dL (ref <150)

## 2010-05-02 LAB — IRON AND TIBC
Iron: 55 ug/dL (ref 42–135)
UIBC: 330 ug/dL

## 2010-05-02 LAB — CARDIAC PANEL(CRET KIN+CKTOT+MB+TROPI)
CK, MB: 2.6 ng/mL (ref 0.3–4.0)
Relative Index: 1.9 (ref 0.0–2.5)
Total CK: 138 U/L (ref 7–177)
Total CK: 162 U/L (ref 7–177)
Troponin I: 0.03 ng/mL (ref 0.00–0.06)

## 2010-05-02 LAB — URINALYSIS, DIPSTICK ONLY
Bilirubin Urine: NEGATIVE
Ketones, ur: NEGATIVE mg/dL
Nitrite: NEGATIVE
pH: 5.5 (ref 5.0–8.0)

## 2010-05-02 LAB — RETICULOCYTES: RBC.: 4.64 MIL/uL (ref 3.87–5.11)

## 2010-05-02 LAB — POCT I-STAT 3, ART BLOOD GAS (G3+)
Acid-base deficit: 1 mmol/L (ref 0.0–2.0)
O2 Saturation: 99 %
Patient temperature: 98.6
TCO2: 26 mmol/L (ref 0–100)
pH, Arterial: 7.373 (ref 7.350–7.400)

## 2010-05-02 LAB — URINALYSIS, ROUTINE W REFLEX MICROSCOPIC
Bilirubin Urine: NEGATIVE
Nitrite: NEGATIVE
Specific Gravity, Urine: 1.006 (ref 1.005–1.030)
pH: 5.5 (ref 5.0–8.0)

## 2010-05-02 LAB — DIFFERENTIAL
Basophils Relative: 0 % (ref 0–1)
Lymphocytes Relative: 17 % (ref 12–46)

## 2010-05-02 LAB — PHOSPHORUS: Phosphorus: 4.6 mg/dL (ref 2.3–4.6)

## 2010-05-02 LAB — CK TOTAL AND CKMB (NOT AT ARMC)
CK, MB: 2.5 ng/mL (ref 0.3–4.0)
Relative Index: INVALID (ref 0.0–2.5)
Total CK: 42 U/L (ref 7–177)

## 2010-05-02 LAB — URINE CULTURE: Colony Count: NO GROWTH

## 2010-05-02 LAB — T4, FREE: Free T4: 3.21 ng/dL — ABNORMAL HIGH (ref 0.80–1.80)

## 2010-05-02 LAB — HEMOGLOBIN A1C: Hgb A1c MFr Bld: 6.6 % — ABNORMAL HIGH (ref 4.6–6.1)

## 2010-05-02 LAB — MAGNESIUM: Magnesium: 1.6 mg/dL (ref 1.5–2.5)

## 2010-05-02 LAB — HEMOCCULT GUIAC POC 1CARD (OFFICE): Fecal Occult Bld: NEGATIVE

## 2010-05-02 LAB — APTT: aPTT: 31 seconds (ref 24–37)

## 2010-05-02 LAB — BRAIN NATRIURETIC PEPTIDE: Pro B Natriuretic peptide (BNP): 391 pg/mL — ABNORMAL HIGH (ref 0.0–100.0)

## 2010-05-02 LAB — FOLATE: Folate: 11 ng/mL

## 2010-05-02 LAB — FERRITIN: Ferritin: 14 ng/mL (ref 10–291)

## 2010-05-02 LAB — PROTIME-INR: Prothrombin Time: 14.2 seconds (ref 11.6–15.2)

## 2010-05-02 LAB — POCT CARDIAC MARKERS: Troponin i, poc: <0.05 ng/mL (ref 0.00–0.09)

## 2010-05-06 ENCOUNTER — Other Ambulatory Visit

## 2010-05-24 ENCOUNTER — Other Ambulatory Visit: Admitting: *Deleted

## 2010-05-30 ENCOUNTER — Other Ambulatory Visit: Admitting: *Deleted

## 2010-05-30 ENCOUNTER — Other Ambulatory Visit (INDEPENDENT_AMBULATORY_CARE_PROVIDER_SITE_OTHER): Admitting: *Deleted

## 2010-05-30 DIAGNOSIS — I10 Essential (primary) hypertension: Secondary | ICD-10-CM

## 2010-05-30 DIAGNOSIS — I509 Heart failure, unspecified: Secondary | ICD-10-CM

## 2010-05-30 LAB — BASIC METABOLIC PANEL
BUN: 22 mg/dL (ref 6–23)
Calcium: 8.1 mg/dL — ABNORMAL LOW (ref 8.4–10.5)
Chloride: 99 mEq/L (ref 96–112)
Creatinine, Ser: 1.2 mg/dL (ref 0.4–1.2)
GFR: 50.03 mL/min — ABNORMAL LOW (ref >60.00)

## 2010-07-07 ENCOUNTER — Other Ambulatory Visit (INDEPENDENT_AMBULATORY_CARE_PROVIDER_SITE_OTHER)

## 2010-07-07 ENCOUNTER — Ambulatory Visit (INDEPENDENT_AMBULATORY_CARE_PROVIDER_SITE_OTHER): Admitting: Endocrinology

## 2010-07-07 ENCOUNTER — Encounter: Payer: Self-pay | Admitting: Endocrinology

## 2010-07-07 VITALS — BP 100/60 | HR 79 | Temp 99.0°F

## 2010-07-07 DIAGNOSIS — E0590 Thyrotoxicosis, unspecified without thyrotoxic crisis or storm: Secondary | ICD-10-CM

## 2010-07-07 DIAGNOSIS — Z79899 Other long term (current) drug therapy: Secondary | ICD-10-CM

## 2010-07-07 DIAGNOSIS — Z23 Encounter for immunization: Secondary | ICD-10-CM

## 2010-07-07 LAB — CBC WITH DIFFERENTIAL/PLATELET
Eosinophils Absolute: 0.4 10*3/uL (ref 0.0–0.7)
HCT: 33.6 % — ABNORMAL LOW (ref 36.0–46.0)
Lymphs Abs: 2.4 10*3/uL (ref 0.7–4.0)
MCHC: 34.5 g/dL (ref 30.0–36.0)
MCV: 87.3 fl (ref 78.0–100.0)
Monocytes Absolute: 0.8 10*3/uL (ref 0.1–1.0)
Neutrophils Relative %: 57.2 % (ref 43.0–77.0)
Platelets: 256 10*3/uL (ref 150.0–400.0)

## 2010-07-07 NOTE — Progress Notes (Signed)
Subjective:    Patient ID: Katie Higgins, female    DOB: Jan 18, 1951, 60 y.o.   MRN: [id number]  HPI Pt returns for f/u of hyperthyroidism.  Tapazole can't be topped for i-131 rx, due to her chf.  pt states she feels well in general, except for recovering from a recent uri. Past Medical History  Diagnosis Date  . HYPERTHYROIDISM 05/17/2009  . DIABETES MELLITUS, CONTROLLED 06/04/2009  . DYSLIPIDEMIA 06/04/2009  . CARPAL TUNNEL SYNDROME, LEFT, MILD 09/22/2009  . HYPERTENSION 05/17/2009  . CARDIOMYOPATHY 05/17/2009    Nonischemic. Admitted in 3/11 with CHF exac, due to hyperthyroidism. LHC (03/11) showed clean coronaries with EF 40%. Myoview showed EF 38%. Echo was a technically difficult study with mild to moderately decreased EF, mild LVH, mild MR. Repeat echo (9/11) with EF 55-60%, mild diastolic dysfunction (grade I).  . Congestive heart failure, unspecified 05/17/2009  . ALLERGIC RHINITIS 06/04/2009  . Osteoarthritis     esp L hip, low back  . Obesity     No past surgical history on file.  History   Social History  . Marital Status: Widowed    Spouse Name: N/A    Number of Children: N/A  . Years of Education: N/A   Occupational History  . Not on file.   Social History Main Topics  . Smoking status: Never Smoker   . Smokeless tobacco: Not on file  . Alcohol Use: No  . Drug Use:   . Sexually Active:    Other Topics Concern  . Not on file   Social History Narrative   Lives in [place] with her son.Widowed since 1992.She is very sedentary, uses a wheelchair when out of the house.Ambulatory in home with cane.    Current Outpatient Prescriptions on File Prior to Visit  Medication Sig Dispense Refill  . albuterol (PROVENTIL) (2.5 MG/3ML) 0.083% nebulizer solution Take by nebulization as needed.        [name] aspirin 81 MG tablet Take 81 mg by mouth daily.        . carvedilol (COREG) 25 MG tablet Take 25 mg by mouth 2 (two) times daily with a meal.        . diphenhydrAMINE  (BENADRYL) 25 mg capsule Take 25 mg by mouth every 4 (four) hours as needed. For allergies       . enalapril (VASOTEC) 10 MG tablet 1/2 tablet by mouth daily       . furosemide (LASIX) 40 MG tablet 1/2 tablet every other day       . loratadine (CLARITIN) 10 MG tablet 1 by mouth once daily as needed for allergies       . lovastatin (MEVACOR) 10 MG tablet Take 10 mg by mouth daily.        . methimazole (TAPAZOLE) 10 MG tablet Take 10 mg by mouth daily.        . Multiple Vitamin (MULTIVITAMIN) tablet Take 1 tablet by mouth daily.        . Naproxen Sodium (ALEVE) 220 MG CAPS Take 1 capsule by mouth 2 (two) times daily.        . traMADol (ULTRAM) 50 MG tablet 1-2 tablets by mouth every 6 hours as needed for pain symptoms       . DISCONTD: pseudoephedrine (PSUDATABS) 30 MG tablet Take 30 mg by mouth 2 (two) times daily as needed.          Allergies  Allergen Reactions  . Vicodin (Hydrocodone-Acetaminophen) Nausea And Vomiting    Family  History  Problem Relation Age of Onset  . Diabetes Mother   . Cancer Mother     Breast Cancer, Cervicial Cancer  . Cancer Father     Kidney Cancer  . Stroke Father     CVA    BP 100/60  Pulse 79  Temp(Src) 99 F (37.2 C) (Oral)  SpO2 95%    Review of Systems Denies fever.    Objective:   Physical Exam GEN: no distress Neck:  thyroid is only slightly enlarged, slightly bigger on the right lobe, than the left.  no palpable nodule        Assessment & Plan:  Hyperthyroidism.  She can't have w/ nad i-131 rx, due to risk in stopping tapazole (chf). Mildly elev temp.

## 2010-07-07 NOTE — Patient Instructions (Signed)
blood tests are being ordered for you today.  please call [phone] to hear your test results.  You will be prompted to enter the 9-digit "MRN" number that appears at the top left of this page, followed by #.  Then you will hear the message. Please make a follow-up appointment in 6 months if ever you have fever while taking this medication, stop it and call us, because of the risk of a rare side-effect.

## 2010-07-07 NOTE — Progress Notes (Signed)
Addended by: Brenton Grills C on: 07/07/2010 02:08 PM   Modules accepted: Orders

## 2010-08-05 ENCOUNTER — Ambulatory Visit: Admitting: Internal Medicine

## 2010-08-09 ENCOUNTER — Encounter: Payer: Self-pay | Admitting: Internal Medicine

## 2010-08-12 ENCOUNTER — Ambulatory Visit: Admitting: Internal Medicine

## 2010-09-14 ENCOUNTER — Other Ambulatory Visit (INDEPENDENT_AMBULATORY_CARE_PROVIDER_SITE_OTHER)

## 2010-09-14 ENCOUNTER — Ambulatory Visit (INDEPENDENT_AMBULATORY_CARE_PROVIDER_SITE_OTHER): Admitting: Internal Medicine

## 2010-09-14 ENCOUNTER — Encounter: Payer: Self-pay | Admitting: Internal Medicine

## 2010-09-14 ENCOUNTER — Other Ambulatory Visit: Payer: Self-pay | Admitting: Internal Medicine

## 2010-09-14 VITALS — BP 92/60 | HR 77 | Temp 98.7°F

## 2010-09-14 DIAGNOSIS — E860 Dehydration: Secondary | ICD-10-CM

## 2010-09-14 DIAGNOSIS — R197 Diarrhea, unspecified: Secondary | ICD-10-CM

## 2010-09-14 DIAGNOSIS — M75100 Unspecified rotator cuff tear or rupture of unspecified shoulder, not specified as traumatic: Secondary | ICD-10-CM

## 2010-09-14 DIAGNOSIS — M719 Bursopathy, unspecified: Secondary | ICD-10-CM

## 2010-09-14 DIAGNOSIS — G4700 Insomnia, unspecified: Secondary | ICD-10-CM

## 2010-09-14 DIAGNOSIS — E875 Hyperkalemia: Secondary | ICD-10-CM

## 2010-09-14 LAB — BASIC METABOLIC PANEL
CO2: 25 mEq/L (ref 19–32)
Calcium: 8.6 mg/dL (ref 8.4–10.5)
Creatinine, Ser: 1.8 mg/dL — ABNORMAL HIGH (ref 0.4–1.2)
GFR: 30.6 mL/min — ABNORMAL LOW (ref >60.00)
Sodium: 141 mEq/L (ref 135–145)

## 2010-09-14 LAB — CBC WITH DIFFERENTIAL/PLATELET
Basophils Relative: 0.5 % (ref 0.0–3.0)
Eosinophils Absolute: 0.5 10*3/uL (ref 0.0–0.7)
Hemoglobin: 11.1 g/dL — ABNORMAL LOW (ref 12.0–15.0)
Lymphocytes Relative: 24 % (ref 12.0–46.0)
MCHC: 33.6 g/dL (ref 30.0–36.0)
Monocytes Relative: 10.8 % (ref 3.0–12.0)
Neutro Abs: 4.8 10*3/uL (ref 1.4–7.7)
Neutrophils Relative %: 58.9 % (ref 43.0–77.0)
RBC: 3.71 Mil/uL — ABNORMAL LOW (ref 3.87–5.11)
WBC: 8.1 10*3/uL (ref 4.5–10.5)

## 2010-09-14 LAB — HEPATIC FUNCTION PANEL
ALT: 10 U/L (ref 0–35)
AST: 14 U/L (ref 0–37)
Albumin: 3.8 g/dL (ref 3.5–5.2)
Alkaline Phosphatase: 65 U/L (ref 39–117)
Total Protein: 7.2 g/dL (ref 6.0–8.3)

## 2010-09-14 MED ORDER — AMITRIPTYLINE HCL 10 MG PO TABS: 10.0000 mg | ORAL_TABLET | Freq: Every day | ORAL | Status: DC

## 2010-09-14 MED ORDER — TRAMADOL HCL 50 MG PO TABS: 50.0000 mg | ORAL_TABLET | Freq: Four times a day (QID) | ORAL | Status: DC | PRN

## 2010-09-14 NOTE — Progress Notes (Signed)
Subjective:    Patient ID: Katie Higgins, female    DOB: July 19, 1950, 60 y.o.   MRN: [id number]  HPI  here for diarrhea Onset 2-3 weeks ago Usual loose stool with use of lasix (qod) but now loose daily No abd pain or cramping, no blood or melena No fever but "chilled" despite hot weather; no travel, no med changes - no sick contacts No recent antibiotics  Also reviewed chronic medical issues: CHF - last hosp 04/2009 due to same  s/p cardiac cath for same -  ongoing med mgmt without recurrent SOB or swelling -  following with cards - reports 100% compliance with meds-  no inc edema orSOB since md change   hyperthyroidism - dx during 04/2009 hosp for chf- follows with endo -  reports compliance with ongoing medical treatment and no changes in medication dose or frequency.  denies adverse side effects related to current therapy.   hypertension - reports compliance with ongoing medical treatment and no changes in medication dose or frequency. denies adverse side effects related to current therapy.   dyslipdemia - reports compliance with ongoing medical treatment and no changes in medication dose or frequency. denies adverse side effects related to current therapy.   diet DM2 - likely related to hyperthyroid per pt - checks sugars 3x/week - this AM 117 - diet control only at this time   Past Medical History  Diagnosis Date  . Palpitations   . Obesity   . CARDIOMYOPATHY     Nonischemic. Admitted in 3/11 with CHF exac, due to hyperthyroidism. LHC (03/11) showed clean coronaries with EF 40%. Myoview showed EF 38%. Echo was a technically difficult study with mild to moderately decreased EF, mild LVH, mild MR. Repeat echo (9/11) with EF 55-60%, mild diastolic dysfunction (grade I).  . Congestive heart failure, unspecified   . Osteoarthritis     esp L hip, low back  . ALLERGIC RHINITIS   . DIABETES MELLITUS, CONTROLLED   . DYSLIPIDEMIA   . HYPERTENSION   . HYPERTHYROIDISM   . CARPAL  TUNNEL SYNDROME, LEFT, MILD      Review of Systems  Respiratory: Negative for cough.   Cardiovascular: Negative for chest pain.  Neurological: Negative for headaches.  Psychiatric/Behavioral:       Insomnia and mild depression related to same  also see HPI above     Objective:   Physical Exam BP 92/60  Pulse 77  Temp(Src) 98.7 F (37.1 C) (Oral)  SpO2 95% Constitutional: She is overweight; appears well-developed and well-nourished. No distress. sitting in manual WC (usual) HENT: Head: Normocephalic and atraumatic. Ears; B TMs ok, no erythema or effusion; Nose: Nose normal.  Mouth/Throat: Oropharynx is clear and moist. No oropharyngeal exudate.  Eyes: Conjunctivae and EOM are normal. Pupils are equal, round, and reactive to light. No scleral icterus.  Neck: Normal range of motion. Neck supple. No JVD present. No thyromegaly present.  Cardiovascular: Normal rate, regular rhythm and normal heart sounds.  No murmur heard. No BLE edema. Pulmonary/Chest: Effort normal and breath sounds normal. No respiratory distress. She has no wheezes.  Abdominal: Soft, obese pannus; Bowel sounds are normal. She exhibits no distension. There is no tenderness. no mass, no rebound/gaurding Musculoskeletal: B shoulders with decreased range of motion on forward flexion, abduction, and internal rotation. Positive impingement signs. Decreased strength with stressing of rotator cuff. Pain with crossed arm adduction. referred pain into distal deltoid. Tender over a.c. joint and subacromial.  Psychiatric: She has a normal mood and  affect. Her behavior is normal. Judgment and thought content normal.   Wt Readings from Last 3 Encounters:  04/05/10 260 lb 12 oz (118.275 kg)  03/21/10 261 lb (118.389 kg)  01/04/10 238 lb (107.956 kg)   Lab Results  Component Value Date   WBC 8.5 07/07/2010   HGB 11.6* 07/07/2010   HCT 33.6* 07/07/2010   PLT 256.0 07/07/2010   CHOL 150 01/04/2010   TRIG 61.0 01/04/2010   HDL  49.40 01/04/2010   ALT 21 05/11/2009   AST 20 05/11/2009   NA 138 05/30/2010   K 4.4 05/30/2010   CL 99 05/30/2010   CREATININE 1.2 05/30/2010   BUN 22 05/30/2010   CO2 26 05/30/2010   TSH 2.58 07/07/2010   INR 1.11 04/19/2009   HGBA1C 5.0 01/04/2010        Assessment & Plan:  Diarrhea - ongoing > 2 weeks, not improved despite taking no lasix - VSS, exam/hx benign; check labs and stool sample for c diff - no hx for parasites - consider colo eval if unimproved - ok to continue OTC immodium as needed  Insomnia, ?related to OA - see next - erx low dose amitriptyline to use prn  B RTC tendonitis -exac by manual WC self maneuvering; declines steroid shot due to DM - ok to use occasional NSIADs or tramadol as needed

## 2010-09-14 NOTE — Patient Instructions (Signed)
It was good to see you today. Test(s) ordered today. Your results will be called to you after review (48-72hours after test completion). If any changes need to be made, you will be notified at that time. Medications reviewed, start elavil as needed for sleep - Your prescription(s) have been submitted to your pharmacy. Please take as directed and contact our office if you believe you are having problem(s) with the medication(s). Ok to use immodium as needed Refill on medication(s) as discussed today - copy of tramadol given to you to mail as requested

## 2010-09-20 ENCOUNTER — Telehealth: Payer: Self-pay | Admitting: *Deleted

## 2010-09-20 ENCOUNTER — Emergency Department (HOSPITAL_COMMUNITY)
Admission: EM | Admit: 2010-09-20 | Discharge: 2010-09-20 | Disposition: A | Discharge status: Home / Self Care | Attending: Emergency Medicine | Admitting: Emergency Medicine

## 2010-09-20 ENCOUNTER — Other Ambulatory Visit (INDEPENDENT_AMBULATORY_CARE_PROVIDER_SITE_OTHER)

## 2010-09-20 DIAGNOSIS — I509 Heart failure, unspecified: Secondary | ICD-10-CM | POA: Insufficient documentation

## 2010-09-20 DIAGNOSIS — I10 Essential (primary) hypertension: Secondary | ICD-10-CM | POA: Insufficient documentation

## 2010-09-20 DIAGNOSIS — R7989 Other specified abnormal findings of blood chemistry: Secondary | ICD-10-CM | POA: Insufficient documentation

## 2010-09-20 DIAGNOSIS — Z049 Encounter for examination and observation for unspecified reason: Secondary | ICD-10-CM | POA: Insufficient documentation

## 2010-09-20 DIAGNOSIS — R197 Diarrhea, unspecified: Secondary | ICD-10-CM

## 2010-09-20 DIAGNOSIS — E119 Type 2 diabetes mellitus without complications: Secondary | ICD-10-CM | POA: Insufficient documentation

## 2010-09-20 DIAGNOSIS — E875 Hyperkalemia: Secondary | ICD-10-CM

## 2010-09-20 DIAGNOSIS — E860 Dehydration: Secondary | ICD-10-CM

## 2010-09-20 LAB — COMPREHENSIVE METABOLIC PANEL
ALT: 14 U/L (ref 0–35)
AST: 24 U/L (ref 0–37)
Albumin: 3.9 g/dL (ref 3.5–5.2)
CO2: 23 mEq/L (ref 19–32)
Calcium: 9 mg/dL (ref 8.4–10.5)
Chloride: 104 mEq/L (ref 96–112)
Creatinine, Ser: 1.13 mg/dL — ABNORMAL HIGH (ref 0.50–1.10)
GFR calc non Af Amer: 49 mL/min — ABNORMAL LOW (ref >60)
Sodium: 137 mEq/L (ref 135–145)

## 2010-09-20 LAB — CBC
MCH: 29.2 pg (ref 26.0–34.0)
Platelets: 299 10*3/uL (ref 150–400)
RBC: 3.77 MIL/uL — ABNORMAL LOW (ref 3.87–5.11)
RDW: 13.9 % (ref 11.5–15.5)
WBC: 9.3 10*3/uL (ref 4.0–10.5)

## 2010-09-20 LAB — BASIC METABOLIC PANEL
Chloride: 109 mEq/L (ref 96–112)
GFR: 52.03 mL/min — ABNORMAL LOW (ref >60.00)
Glucose, Bld: 100 mg/dL — ABNORMAL HIGH (ref 70–99)
Potassium: 6.3 mEq/L (ref 3.5–5.1)
Sodium: 142 mEq/L (ref 135–145)

## 2010-09-20 LAB — DIFFERENTIAL
Basophils Relative: 1 % (ref 0–1)
Eosinophils Absolute: 0.4 10*3/uL (ref 0.0–0.7)
Eosinophils Relative: 4 % (ref 0–5)
Neutrophils Relative %: 58 % (ref 43–77)

## 2010-09-20 NOTE — Telephone Encounter (Signed)
Pt needs to go to ER for EKG and treatment of this critical potassium, prev 5.8 last week, now 6.3 on recheck - please call pt and advise of same

## 2010-09-20 NOTE — Telephone Encounter (Signed)
Pt return call back gave results concerning labs & md recommendations. Pt states she will have her husband Scientist, physiological) to take her to [facility] now.Marland KitchenMarland Kitchen8/14/12@4 :33pm/LMB

## 2010-09-20 NOTE — Telephone Encounter (Signed)
Received call from lab/Tonya. Critical K = 6.3

## 2010-09-20 NOTE — Telephone Encounter (Signed)
Called pt no answer LMOM RTC ASAP concerning labs, also called husband cell # not in service anymore.Marland KitchenMarland Kitchen8/14/12@4 :13pm/LMB

## 2010-09-21 LAB — CLOSTRIDIUM DIFFICILE EIA: CDIFTX: NEGATIVE

## 2010-09-21 NOTE — Telephone Encounter (Signed)
Discussed with CMA yesterday afternoon when documented - agree - thanks

## 2010-10-11 ENCOUNTER — Other Ambulatory Visit: Payer: Self-pay | Admitting: *Deleted

## 2010-10-11 MED ORDER — METHIMAZOLE 10 MG PO TABS: 10.0000 mg | ORAL_TABLET | Freq: Every day | ORAL | Status: DC

## 2010-10-11 NOTE — Telephone Encounter (Signed)
Rx faxed to [facility] mail order pharmacy

## 2010-10-11 NOTE — Telephone Encounter (Signed)
Pt needs rx for Methimazole faxed to [facility]-fax # [phone]  Rx awaiting MD's signature

## 2010-10-19 ENCOUNTER — Emergency Department (HOSPITAL_COMMUNITY): Payer: No Typology Code available for payment source

## 2010-10-19 ENCOUNTER — Emergency Department (HOSPITAL_COMMUNITY)
Admission: EM | Admit: 2010-10-19 | Discharge: 2010-10-19 | Discharge status: Against Medical Advice | Payer: No Typology Code available for payment source | Attending: Emergency Medicine | Admitting: Emergency Medicine

## 2010-10-19 DIAGNOSIS — I10 Essential (primary) hypertension: Secondary | ICD-10-CM | POA: Insufficient documentation

## 2010-10-19 DIAGNOSIS — M542 Cervicalgia: Secondary | ICD-10-CM | POA: Insufficient documentation

## 2010-10-19 DIAGNOSIS — Y92410 Unspecified street and highway as the place of occurrence of the external cause: Secondary | ICD-10-CM | POA: Insufficient documentation

## 2010-10-19 DIAGNOSIS — M25519 Pain in unspecified shoulder: Secondary | ICD-10-CM | POA: Insufficient documentation

## 2010-10-19 DIAGNOSIS — T1490XA Injury, unspecified, initial encounter: Secondary | ICD-10-CM | POA: Insufficient documentation

## 2010-11-10 ENCOUNTER — Ambulatory Visit: Admitting: Endocrinology

## 2011-01-09 ENCOUNTER — Encounter: Payer: Self-pay | Admitting: Internal Medicine

## 2011-01-09 ENCOUNTER — Ambulatory Visit (INDEPENDENT_AMBULATORY_CARE_PROVIDER_SITE_OTHER): Admitting: Internal Medicine

## 2011-01-09 ENCOUNTER — Encounter: Payer: Self-pay | Admitting: Endocrinology

## 2011-01-09 ENCOUNTER — Ambulatory Visit (INDEPENDENT_AMBULATORY_CARE_PROVIDER_SITE_OTHER): Admitting: Endocrinology

## 2011-01-09 ENCOUNTER — Other Ambulatory Visit (INDEPENDENT_AMBULATORY_CARE_PROVIDER_SITE_OTHER)

## 2011-01-09 VITALS — BP 102/58 | HR 69 | Temp 98.1°F

## 2011-01-09 DIAGNOSIS — E119 Type 2 diabetes mellitus without complications: Secondary | ICD-10-CM

## 2011-01-09 DIAGNOSIS — E0590 Thyrotoxicosis, unspecified without thyrotoxic crisis or storm: Secondary | ICD-10-CM

## 2011-01-09 DIAGNOSIS — M62838 Other muscle spasm: Secondary | ICD-10-CM

## 2011-01-09 DIAGNOSIS — M67919 Unspecified disorder of synovium and tendon, unspecified shoulder: Secondary | ICD-10-CM

## 2011-01-09 DIAGNOSIS — M75100 Unspecified rotator cuff tear or rupture of unspecified shoulder, not specified as traumatic: Secondary | ICD-10-CM

## 2011-01-09 DIAGNOSIS — Z23 Encounter for immunization: Secondary | ICD-10-CM

## 2011-01-09 DIAGNOSIS — G4700 Insomnia, unspecified: Secondary | ICD-10-CM

## 2011-01-09 LAB — HEMOGLOBIN A1C: Hgb A1c MFr Bld: 5.3 % (ref 4.6–6.5)

## 2011-01-09 MED ORDER — AMITRIPTYLINE HCL 10 MG PO TABS: 10.0000 mg | ORAL_TABLET | Freq: Every day | ORAL | Status: DC

## 2011-01-09 MED ORDER — TRAMADOL HCL 50 MG PO TABS: 50.0000 mg | ORAL_TABLET | Freq: Four times a day (QID) | ORAL | Status: DC | PRN

## 2011-01-09 MED ORDER — CYCLOBENZAPRINE HCL 10 MG PO TABS: 10.0000 mg | ORAL_TABLET | Freq: Three times a day (TID) | ORAL | Status: DC | PRN

## 2011-01-09 NOTE — Assessment & Plan Note (Signed)
Diet controlled - weight gain reviewed - Check a1c today - Lab Results  Component Value Date   HGBA1C 5.0 01/04/2010

## 2011-01-09 NOTE — Patient Instructions (Signed)
It was good to see you today. Use flexeril for muscle spasm to help neck pain - Your prescription(s) have been submitted to your pharmacy. Please take as directed and contact our office if you believe you are having problem(s) with the medication(s). we'll make referral to physical therapy for your neck pain. Our office will contact you regarding appointment(s) once made. Flu shot today Refill on medication(s) as discussed today. Please schedule followup in 4-6 months, call sooner if problems.

## 2011-01-09 NOTE — Patient Instructions (Addendum)
blood tests are being ordered for you today.  please call [phone] to hear your test results.  You will be prompted to enter the 9-digit "MRN" number that appears at the top left of this page, followed by #.  Then you will hear the message. Please make a follow-up appointment in 6 months if ever you have fever while taking this medication, stop it and call us, because of the risk of a rare side-effect.
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
## 2011-01-09 NOTE — Progress Notes (Signed)
Subjective:    Patient ID: Katie Higgins, female    DOB: January 04, 1951, 60 y.o.   MRN: [id number]  HPI Pt returns for f/u of grave's dz (2010).  She has been unable to d/c tapazole for i-131 rx, due to chf.  pt states she feels well in general, except for a recent uri.   Past Medical History  Diagnosis Date  . Palpitations   . Obesity   . CARDIOMYOPATHY     Nonischemic. Admitted in 3/11 with CHF exac, due to hyperthyroidism. LHC (03/11) showed clean coronaries with EF 40%. Myoview showed EF 38%. Echo was a technically difficult study with mild to moderately decreased EF, mild LVH, mild MR. Repeat echo (9/11) with EF 55-60%, mild diastolic dysfunction (grade I).  . Congestive heart failure, unspecified   . Osteoarthritis     esp L hip, low back  . ALLERGIC RHINITIS   . DIABETES MELLITUS, CONTROLLED   . DYSLIPIDEMIA   . HYPERTENSION   . HYPERTHYROIDISM   . CARPAL TUNNEL SYNDROME, LEFT, MILD     Past Surgical History  Procedure Date  . No past surgeries     History   Social History  . Marital Status: Widowed    Spouse Name: N/A    Number of Children: N/A  . Years of Education: N/A   Occupational History  . Not on file.   Social History Main Topics  . Smoking status: Never Smoker   . Smokeless tobacco: Not on file   Comment: Widowed since 1992. Lives in [place] with her son. She is very sedentary, uses a wheelchair when out of the house. Ambulatory in home with cane.  . Alcohol Use: No  . Drug Use: No  . Sexually Active:    Other Topics Concern  . Not on file   Social History Narrative   Lives in [place] with her son.Widowed since 1992.She is very sedentary, uses a wheelchair when out of the house.Ambulatory in home with cane.    Current Outpatient Prescriptions on File Prior to Visit  Medication Sig Dispense Refill  . albuterol (PROVENTIL) (2.5 MG/3ML) 0.083% nebulizer solution Take by nebulization as needed.        [name] amitriptyline (ELAVIL) 10 MG tablet Take 1  tablet (10 mg total) by mouth at bedtime.  30 tablet  3  . aspirin 81 MG tablet Take 81 mg by mouth daily.        . carvedilol (COREG) 25 MG tablet Take 25 mg by mouth 2 (two) times daily with a meal.        . diphenhydrAMINE (BENADRYL) 25 mg capsule Take 25 mg by mouth every 4 (four) hours as needed. For allergies       . enalapril (VASOTEC) 10 MG tablet 1/2 tablet by mouth daily       . furosemide (LASIX) 40 MG tablet 1/2 tablet every other day       . loratadine (CLARITIN) 10 MG tablet 1 by mouth once daily as needed for allergies       . lovastatin (MEVACOR) 10 MG tablet Take 10 mg by mouth daily.        . methimazole (TAPAZOLE) 10 MG tablet Take 1 tablet (10 mg total) by mouth daily.  90 tablet  1  . Multiple Vitamin (MULTIVITAMIN) tablet Take 1 tablet by mouth daily.        . Naproxen Sodium (ALEVE) 220 MG CAPS Take 1 capsule by mouth 2 (two) times daily.        [name]  traMADol (ULTRAM) 50 MG tablet Take 1 tablet (50 mg total) by mouth every 6 (six) hours as needed for pain.  270 tablet  0    Allergies  Allergen Reactions  . Vicodin (Hydrocodone-Acetaminophen) Nausea And Vomiting    Family History  Problem Relation Age of Onset  . Diabetes Mother   . Cancer Mother     Breast Cancer, Cervicial Cancer  . Cancer Father     Kidney Cancer  . Stroke Father     CVA    BP 102/58  Pulse 69  Temp(Src) 98.1 F (36.7 C) (Oral)  SpO2 98%    Review of Systems Denies fever    Objective:   Physical Exam VITAL SIGNS:  See vs page GENERAL: no distress Neck: thyroid is only slightly enlarged, slightly bigger on the right lobe, than the left. no palpable nodule.       Assessment & Plan:  Hyperthyroidism, on tapazole due to inability to tolerate being off it (chf).

## 2011-03-28 ENCOUNTER — Ambulatory Visit: Admitting: Cardiology

## 2011-04-10 ENCOUNTER — Ambulatory Visit: Admitting: Cardiology

## 2011-05-04 ENCOUNTER — Encounter: Payer: Self-pay | Admitting: Cardiology

## 2011-05-04 ENCOUNTER — Ambulatory Visit (INDEPENDENT_AMBULATORY_CARE_PROVIDER_SITE_OTHER): Admitting: Cardiology

## 2011-05-04 VITALS — BP 125/65 | HR 60 | Ht 67.0 in | Wt 283.4 lb

## 2011-05-04 DIAGNOSIS — I5032 Chronic diastolic (congestive) heart failure: Secondary | ICD-10-CM

## 2011-05-04 DIAGNOSIS — I428 Other cardiomyopathies: Secondary | ICD-10-CM

## 2011-05-04 DIAGNOSIS — I509 Heart failure, unspecified: Secondary | ICD-10-CM

## 2011-05-04 DIAGNOSIS — E785 Hyperlipidemia, unspecified: Secondary | ICD-10-CM

## 2011-05-04 DIAGNOSIS — E669 Obesity, unspecified: Secondary | ICD-10-CM

## 2011-05-04 MED ORDER — ENALAPRIL MALEATE 10 MG PO TABS: 10.0000 mg | ORAL_TABLET | Freq: Every day | ORAL | Status: DC

## 2011-05-04 MED ORDER — FUROSEMIDE 20 MG PO TABS: 20.0000 mg | ORAL_TABLET | Freq: Every day | ORAL | Status: DC

## 2011-05-04 MED ORDER — LOVASTATIN 10 MG PO TABS: 10.0000 mg | ORAL_TABLET | Freq: Every day | ORAL | Status: DC

## 2011-05-04 MED ORDER — CARVEDILOL 25 MG PO TABS: 25.0000 mg | ORAL_TABLET | Freq: Two times a day (BID) | ORAL | Status: DC

## 2011-05-04 NOTE — Patient Instructions (Signed)
Your physician recommends that you return for a FASTING lipid profile/liver profile in the next week or so.   Your physician wants you to follow-up in: 1 year with Dr Shirlee Latch. (March 2014) You will receive a reminder letter in the mail two months in advance. If you don't receive a letter, please call our office to schedule the follow-up appointment.

## 2011-05-07 NOTE — Assessment & Plan Note (Signed)
Check lipids/LFTs.  

## 2011-05-07 NOTE — Assessment & Plan Note (Signed)
Patient had a cardiomyopathy likely due to hyperthyroidism.  Hyperthyroidism is now being treated and EF has improved to normal range on last echo.  Continue Coreg and enalapril.

## 2011-05-07 NOTE — Assessment & Plan Note (Signed)
This is her major problem at this point.  We again talked about the imperative need for her to lose weight.

## 2011-05-07 NOTE — Progress Notes (Signed)
PCP: Dr. Felicity Coyer  61 yo with history of obesity was admitted to [facility] in 3/11 with shortness of breath. She was found to have a CHF exacerbation. TSH was noted to be suppressed and free T4 was very high. Patient was begun on methimazole and diuresed. Echo was a difficult study but showed mild to moderately decreased EF. Myoview showed EF 38% with multiple fixed deficits. Left heart cath showed no significant coronary disease with EF 40%. It was thought that her cardiomyopathy may be the result of hyperthyroidism. Hyperthyroidism is now being treated by Dr. Everardo All. Repeat echo in 9/11 showed EF 55-60%.  She is doing well symptomatically. She has been in a wheelchair when outside the house for a long time due to low back and hip arthritis. She has refused total hip replacement in the past. She is able to walk short distances in her house without dyspnea. 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 Unfortunately, she has gained significant weight since I last saw her, likely due to poor eating habits and lack of exercise. Weight is up another 22 lbs.    Labs (3/11): K 4, creatinine 0.7, BNP 603=>230, free T4 3.2 (high), free T3 8.9 (high), TSH 0.008, LDL 49, HDL 45, HCT 33.5  Labs (4/11): k 3.8, creatinine 0.8, BNP 47  Labs (8/11): creatinine 1.3  Labs (8/12): K 4.8, creatinine 1.13 Labs (12/12): TSH normal  Allergies (verified):  1) ! * Hydrocodone/apap   Past Medical History:  1. DM (ICD-250.00): diet-controlled  2. HYPERTENSION (ICD-401.9)  3. HYPERTHYROIDISM (ICD-242.90)  4. CARDIOMYOPATHY (ICD-425.4): Nonischemic. Admitted in 3/11 with CHF exac, due to hyperthyroidism. LHC (3/11) showed clean coronaries with EF 40%. Myoview showed EF 38%. Echo was a technically difficult study with mild to moderately decreased EF, mild LVH, mild MR. Repeat echo (9/11) with EF 55-60%, mild diastolic dysfunction (grade I).  5. Osteoarthritis - esp L hip, low back  6. Obesity  7. s/p CCY   Family  History:  No premature CAD  mom - expired age 37y - DM, breast cancer, cervical cancer  dad - expired age 81y - CVA, kidney cancer   Social History:  Lives in [place] with her son.  widowed since 1992  She is very sedentary, uses a wheelchair when out of the house.  ambulatory in home with cane  No smoking or ETOH.   Current Outpatient Prescriptions  Medication Sig Dispense Refill  . amitriptyline (ELAVIL) 10 MG tablet Take 1 tablet (10 mg total) by mouth at bedtime.  90 tablet  1  . aspirin 81 MG tablet Take 81 mg by mouth daily.        . carvedilol (COREG) 25 MG tablet Take 1 tablet (25 mg total) by mouth 2 (two) times daily with a meal.  180 tablet  3  . cyclobenzaprine (FLEXERIL) 10 MG tablet Take 1 tablet (10 mg total) by mouth every 8 (eight) hours as needed for muscle spasms.  30 tablet  1  . diphenhydrAMINE (BENADRYL) 25 mg capsule Take 25 mg by mouth every 4 (four) hours as needed. For allergies       . loratadine (CLARITIN) 10 MG tablet 1 by mouth once daily as needed for allergies       . lovastatin (MEVACOR) 10 MG tablet Take 1 tablet (10 mg total) by mouth daily.  90 tablet  3  . methimazole (TAPAZOLE) 10 MG tablet Take 1 tablet (10 mg total) by mouth daily.  90 tablet  1  . Multiple Vitamin (MULTIVITAMIN) tablet Take 1 tablet by mouth daily.        . Naproxen Sodium (ALEVE) 220 MG CAPS Take 1 capsule by mouth 2 (two) times daily as needed.       . traMADol (ULTRAM) 50 MG tablet Take 1 tablet (50 mg total) by mouth every 6 (six) hours as needed for pain.  270 tablet  0  . enalapril (VASOTEC) 10 MG tablet Take 1 tablet (10 mg total) by mouth daily.  90 tablet  3  . furosemide (LASIX) 20 MG tablet Take 1 tablet (20 mg total) by mouth daily.  90 tablet  3    BP 125/65  Pulse 60  Ht 5\' 7"  (1.702 m)  Wt 283 lb 6.4 oz (128.549 kg)  BMI 44.39 kg/m2 General: NAD, morbidly obese.  Neck: No JVD, no thyromegaly or thyroid nodule.  Lungs: Clear to auscultation bilaterally with  normal respiratory effort. CV: Nondisplaced PMI.  Heart regular S1/S2, no S3/S4, no murmur.  Trace ankle edema.  No carotid bruit.  Normal pedal pulses.  Abdomen: Soft, nontender, no hepatosplenomegaly, no distention.   Neurologic: Alert and oriented x 3.  Psych: Normal affect. Extremities: No clubbing or cyanosis.

## 2011-05-10 ENCOUNTER — Other Ambulatory Visit (INDEPENDENT_AMBULATORY_CARE_PROVIDER_SITE_OTHER)

## 2011-05-10 ENCOUNTER — Ambulatory Visit (INDEPENDENT_AMBULATORY_CARE_PROVIDER_SITE_OTHER): Admitting: Internal Medicine

## 2011-05-10 ENCOUNTER — Encounter: Payer: Self-pay | Admitting: Internal Medicine

## 2011-05-10 VITALS — BP 148/90 | HR 74 | Temp 97.9°F | Wt 283.4 lb

## 2011-05-10 DIAGNOSIS — M7540 Impingement syndrome of unspecified shoulder: Secondary | ICD-10-CM

## 2011-05-10 DIAGNOSIS — E119 Type 2 diabetes mellitus without complications: Secondary | ICD-10-CM

## 2011-05-10 DIAGNOSIS — I5032 Chronic diastolic (congestive) heart failure: Secondary | ICD-10-CM

## 2011-05-10 DIAGNOSIS — E0590 Thyrotoxicosis, unspecified without thyrotoxic crisis or storm: Secondary | ICD-10-CM

## 2011-05-10 DIAGNOSIS — I509 Heart failure, unspecified: Secondary | ICD-10-CM

## 2011-05-10 DIAGNOSIS — M67919 Unspecified disorder of synovium and tendon, unspecified shoulder: Secondary | ICD-10-CM

## 2011-05-10 DIAGNOSIS — G4700 Insomnia, unspecified: Secondary | ICD-10-CM

## 2011-05-10 LAB — LIPID PANEL
HDL: 43.3 mg/dL (ref >39.00)
LDL Cholesterol: 59 mg/dL (ref 0–99)
Total CHOL/HDL Ratio: 3
VLDL: 10.8 mg/dL (ref 0.0–40.0)

## 2011-05-10 LAB — HEPATIC FUNCTION PANEL
AST: 13 U/L (ref 0–37)
Albumin: 3.7 g/dL (ref 3.5–5.2)
Alkaline Phosphatase: 66 U/L (ref 39–117)
Bilirubin, Direct: 0.2 mg/dL (ref 0.0–0.3)

## 2011-05-10 LAB — HEMOGLOBIN A1C: Hgb A1c MFr Bld: 5.6 % (ref 4.6–6.5)

## 2011-05-10 MED ORDER — TRAMADOL HCL 50 MG PO TABS: 50.0000 mg | ORAL_TABLET | Freq: Four times a day (QID) | ORAL | Status: DC | PRN

## 2011-05-10 MED ORDER — HYDROCODONE-ACETAMINOPHEN 5-325 MG PO TABS: 1.0000 | ORAL_TABLET | Freq: Four times a day (QID) | ORAL | Status: DC | PRN

## 2011-05-10 MED ORDER — AMITRIPTYLINE HCL 10 MG PO TABS: 10.0000 mg | ORAL_TABLET | Freq: Every day | ORAL | Status: DC

## 2011-05-10 MED ORDER — CYCLOBENZAPRINE HCL 10 MG PO TABS: 10.0000 mg | ORAL_TABLET | Freq: Three times a day (TID) | ORAL | Status: DC | PRN

## 2011-05-10 NOTE — Patient Instructions (Addendum)
It was good to see you today. Test(s) ordered today. Your results will be called to you after review (48-72hours after test completion). If any changes need to be made, you will be notified at that time. Continue flexeril for muscle spasm to help neck pain, also Norco or tramadol for pain as needed - also elavil for sleep Your prescription(s) have been printed and given to you to submit to your pharmacy. Please take as directed and contact our office if you believe you are having problem(s) with the medication(s). Refill on medication(s) as discussed today. Please schedule followup in 6 months, call sooner if problems.

## 2011-05-10 NOTE — Progress Notes (Signed)
Subjective:    Patient ID: Katie Higgins, female    DOB: 08-12-1950, 61 y.o.   MRN: [id number]  HPI  here for follow up -reviewed chronic medical issues:  CHF - last hosp 04/2009 due to same - exac by hyperthyroidism s/p cardiac cath for same - ongoing med mgmt without recurrent shortness of breath or swelling -  following with cards - reports 100% compliance with meds -   hyperthyroidism - dx during 04/2009 hosp for chf- follows with endo -  reports compliance with ongoing medical treatment and no changes in medication dose or frequency.  denies adverse side effects related to current therapy.   hypertension - reports compliance with ongoing medical treatment and no changes in medication dose or frequency. denies adverse side effects related to current therapy.   dyslipidemia - reports compliance with ongoing medical treatment and no changes in medication dose or frequency. denies adverse side effects related to current therapy.   diet DM2 - likely related to hyperthyroid per pt - checks sugars 3x/week - AM 110-114 - diet control only at this time   Past Medical History  Diagnosis Date  . Palpitations   . Obesity   . CARDIOMYOPATHY     Nonischemic. Admitted in 3/11 with CHF exac, due to hyperthyroidism. LHC (03/11) showed clean coronaries with EF 40%. Myoview showed EF 38%. Echo was a technically difficult study with mild to moderately decreased EF, mild LVH, mild MR. Repeat echo (9/11) with EF 55-60%, mild diastolic dysfunction (grade I).  . Congestive heart failure, unspecified   . Osteoarthritis     esp L hip, low back  . ALLERGIC RHINITIS   . DIABETES MELLITUS, CONTROLLED   . DYSLIPIDEMIA   . HYPERTENSION   . HYPERTHYROIDISM   . CARPAL TUNNEL SYNDROME, LEFT, MILD      Review of Systems  Constitutional: Negative for fever and fatigue.  Respiratory: Negative for cough and wheezing.   Cardiovascular: Negative for chest pain and palpitations.  Neurological: Negative for  dizziness and headaches.       Objective:   Physical Exam  BP 148/90  Pulse 74  Temp(Src) 97.9 F (36.6 C) (Oral)  Wt 283 lb 6.4 oz (128.549 kg)  SpO2 96% Wt Readings from Last 3 Encounters:  05/10/11 283 lb 6.4 oz (128.549 kg)  05/04/11 283 lb 6.4 oz (128.549 kg)  04/05/10 260 lb 12 oz (118.275 kg)   Constitutional: She is overweight; appears well-developed and well-nourished. No distress. sitting in manual WC (usual)  Cardiovascular: Normal rate, regular rhythm and normal heart sounds.  No murmur heard. No BLE edema. Pulmonary/Chest: Effort normal and breath sounds normal. No respiratory distress. She has no wheezes.  Musculoskeletal: B shoulders with decreased range of motion on forward flexion, abduction, and internal rotation. Positive impingement signs. Decreased strength with stressing of rotator cuff. Pain with crossed arm adduction. referred pain into distal deltoid. Tender over a.c. joint and subacromial.  Psychiatric: She has a normal mood and affect. Her behavior is normal. Judgment and thought content normal.   Lab Results  Component Value Date   WBC 9.3 09/20/2010   HGB 11.0* 09/20/2010   HCT 33.6* 09/20/2010   PLT 299 09/20/2010   CHOL 150 01/04/2010   TRIG 61.0 01/04/2010   HDL 49.40 01/04/2010   ALT 14 09/20/2010   AST 24 09/20/2010   NA 137 09/20/2010   K 4.8 09/20/2010   CL 104 09/20/2010   CREATININE 1.13* 09/20/2010   BUN 22 09/20/2010  CO2 23 09/20/2010   TSH 4.63 01/09/2011   INR 1.11 04/19/2009   HGBA1C 5.3 01/09/2011        Assessment & Plan:  See problem list. Medications and labs reviewed today.  B RTC tendonitis -chronic, exac by manual WC self maneuvering; declines steroid shot due to DM - ok to use occasional NSAIDs or tramadol as needed - also will try norco prn (intol reviewed - disproved)

## 2011-05-10 NOTE — Assessment & Plan Note (Signed)
Diet controlled - weight gain reviewed - On ACEI and statin + ASA 81 Check a1c today - Lab Results  Component Value Date   HGBA1C 5.3 01/09/2011

## 2011-05-10 NOTE — Assessment & Plan Note (Signed)
On MMZ, improved mgmt per endo Lab Results  Component Value Date   TSH 4.63 01/09/2011

## 2011-05-11 ENCOUNTER — Telehealth: Payer: Self-pay | Admitting: Cardiology

## 2011-05-11 NOTE — Telephone Encounter (Signed)
Fu call °Pt returning your call  °

## 2011-05-11 NOTE — Telephone Encounter (Signed)
Spoke with pt. I did not call pt. Looks like Dr Diamantina Monks office called pt.

## 2011-05-12 ENCOUNTER — Telehealth: Payer: Self-pay | Admitting: Cardiology

## 2011-05-12 ENCOUNTER — Telehealth: Payer: Self-pay | Admitting: *Deleted

## 2011-05-12 NOTE — Telephone Encounter (Signed)
Follow up from previous call  Patient calling back stating she returning call from Anne L.

## 2011-05-12 NOTE — Telephone Encounter (Signed)
Spoke with pt. Pt states she continues to have a cough. She states overall she feels better and is improving. I recommended pt continue to monitor symptoms and stay well hydrated. If she does not continue to feel better I recommended pt contact her PCP for further recommendations.

## 2011-05-12 NOTE — Telephone Encounter (Signed)
This was done on the wrong patient's chart in error. Please disregard this note.

## 2011-05-12 NOTE — Telephone Encounter (Signed)
Spoke with pt

## 2011-07-11 ENCOUNTER — Ambulatory Visit: Admitting: Endocrinology

## 2011-07-20 ENCOUNTER — Ambulatory Visit: Admitting: Endocrinology

## 2011-07-20 DIAGNOSIS — Z0289 Encounter for other administrative examinations: Secondary | ICD-10-CM

## 2011-07-31 ENCOUNTER — Ambulatory Visit (INDEPENDENT_AMBULATORY_CARE_PROVIDER_SITE_OTHER): Admitting: Internal Medicine

## 2011-07-31 ENCOUNTER — Ambulatory Visit (INDEPENDENT_AMBULATORY_CARE_PROVIDER_SITE_OTHER): Admitting: Endocrinology

## 2011-07-31 ENCOUNTER — Encounter: Payer: Self-pay | Admitting: Internal Medicine

## 2011-07-31 ENCOUNTER — Other Ambulatory Visit (INDEPENDENT_AMBULATORY_CARE_PROVIDER_SITE_OTHER)

## 2011-07-31 ENCOUNTER — Encounter: Payer: Self-pay | Admitting: Endocrinology

## 2011-07-31 ENCOUNTER — Ambulatory Visit (INDEPENDENT_AMBULATORY_CARE_PROVIDER_SITE_OTHER)
Admission: RE | Admit: 2011-07-31 | Discharge: 2011-07-31 | Disposition: A | Discharge status: Home / Self Care | Source: Ambulatory Visit | Attending: Internal Medicine | Admitting: Internal Medicine

## 2011-07-31 VITALS — BP 112/70 | HR 86 | Temp 98.3°F | Ht 62.0 in | Wt 283.4 lb

## 2011-07-31 VITALS — BP 112/70 | HR 80 | Temp 98.3°F | Ht 62.0 in | Wt 283.0 lb

## 2011-07-31 DIAGNOSIS — M719 Bursopathy, unspecified: Secondary | ICD-10-CM

## 2011-07-31 DIAGNOSIS — M25539 Pain in unspecified wrist: Secondary | ICD-10-CM

## 2011-07-31 DIAGNOSIS — M25531 Pain in right wrist: Secondary | ICD-10-CM

## 2011-07-31 DIAGNOSIS — E0590 Thyrotoxicosis, unspecified without thyrotoxic crisis or storm: Secondary | ICD-10-CM

## 2011-07-31 DIAGNOSIS — M7581 Other shoulder lesions, right shoulder: Secondary | ICD-10-CM

## 2011-07-31 LAB — TSH: TSH: 2.92 u[IU]/mL (ref 0.35–5.50)

## 2011-07-31 MED ORDER — METHIMAZOLE 10 MG PO TABS: 10.0000 mg | ORAL_TABLET | Freq: Every day | ORAL | Status: DC

## 2011-07-31 MED ORDER — CYCLOBENZAPRINE HCL 10 MG PO TABS: 10.0000 mg | ORAL_TABLET | Freq: Three times a day (TID) | ORAL | Status: DC | PRN

## 2011-07-31 MED ORDER — HYDROCODONE-ACETAMINOPHEN 5-325 MG PO TABS: 1.0000 | ORAL_TABLET | Freq: Four times a day (QID) | ORAL | Status: DC | PRN

## 2011-07-31 NOTE — Progress Notes (Signed)
Subjective:    Patient ID: Katie Higgins, female    DOB: [birth year], 61 y.o.   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  She has been unable to d/c tapazole for i-131 rx, due to chf.  pt states she feels well in general. Past Medical History  Diagnosis Date  . Palpitations   . Obesity   . CARDIOMYOPATHY     Nonischemic. Admitted in 3/11 with CHF exac, due to hyperthyroidism. LHC (03/11) showed clean coronaries with EF 40%. Myoview showed EF 38%. Echo was a technically difficult study with mild to moderately decreased EF, mild LVH, mild MR. Repeat echo (9/11) with EF 55-60%, mild diastolic dysfunction (grade I).  . Congestive heart failure, unspecified   . Osteoarthritis     esp L hip, low back  . ALLERGIC RHINITIS   . DIABETES MELLITUS, CONTROLLED   . DYSLIPIDEMIA   . HYPERTENSION   . HYPERTHYROIDISM   . CARPAL TUNNEL SYNDROME, LEFT, MILD     Past Surgical History  Procedure Date  . No past surgeries     History   Social History  . Marital Status: Widowed    Spouse Name: N/A    Number of Children: N/A  . Years of Education: N/A   Occupational History  . Not on file.   Social History Main Topics  . Smoking status: Never Smoker   . Smokeless tobacco: Not on file   Comment: Widowed since 1992. Lives in [place] with her son. She is very sedentary, uses a wheelchair when out of the house. Ambulatory in home with cane.  . Alcohol Use: No  . Drug Use: No  . Sexually Active:    Other Topics Concern  . Not on file   Social History Narrative   Lives in [place] with her son.Widowed since 1992.She is very sedentary, uses a wheelchair when out of the house.Ambulatory in home with cane.    Current Outpatient Prescriptions on File Prior to Visit  Medication Sig Dispense Refill  . amitriptyline (ELAVIL) 10 MG tablet Take 1 tablet (10 mg total) by mouth at bedtime.  90 tablet  1  . aspirin 81 MG tablet Take 81 mg by mouth daily.        . carvedilol  (COREG) 25 MG tablet Take 1 tablet (25 mg total) by mouth 2 (two) times daily with a meal.  180 tablet  3  . cyclobenzaprine (FLEXERIL) 10 MG tablet Take 1 tablet (10 mg total) by mouth every 8 (eight) hours as needed for muscle spasms.  90 tablet  1  . diphenhydrAMINE (BENADRYL) 25 mg capsule Take 25 mg by mouth every 4 (four) hours as needed. For allergies       . enalapril (VASOTEC) 10 MG tablet Take 1 tablet (10 mg total) by mouth daily.  90 tablet  3  . furosemide (LASIX) 20 MG tablet Take 1 tablet (20 mg total) by mouth daily.  90 tablet  3  . HYDROcodone-acetaminophen (NORCO) 5-325 MG per tablet Take 1 tablet by mouth every 6 (six) hours as needed for pain.  30 tablet  0  . lovastatin (MEVACOR) 10 MG tablet Take 1 tablet (10 mg total) by mouth daily.  90 tablet  3  . methimazole (TAPAZOLE) 10 MG tablet Take 1 tablet (10 mg total) by mouth daily.  90 tablet  2  . Multiple Vitamin (MULTIVITAMIN) tablet Take 1 tablet by mouth daily.        [name]  Naproxen Sodium (ALEVE) 220 MG CAPS Take 1 capsule by mouth 2 (two) times daily as needed.       . traMADol (ULTRAM) 50 MG tablet Take 1 tablet (50 mg total) by mouth every 6 (six) hours as needed for pain.  270 tablet  0    No Known Allergies  Family History  Problem Relation Age of Onset  . Diabetes Mother   . Cancer Mother     Breast Cancer, Cervicial Cancer  . Cancer Father     Kidney Cancer  . Stroke Father     CVA    BP 112/70  Pulse 80  Temp 98.3 F (36.8 C) (Oral)  Ht 5\' 2"  (1.575 m)  Wt 283 lb (128.368 kg)  BMI 51.76 kg/m2  SpO2 96%  Review of Systems Denies fever    Objective:   Physical Exam VITAL SIGNS:  See vs page GENERAL: no distress Neck: thyroid is only slightly enlarged, slightly bigger on the right lobe, than the left. no palpable nodule.   Lab Results  Component Value Date   TSH 2.92 07/31/2011      Assessment & Plan:  Hyperthyroidism.  well-controlled.

## 2011-07-31 NOTE — Patient Instructions (Signed)
It was good to see you today. Continue flexeril for muscle spasm to help neck pain, also Norco or tramadol for pain as needed -  Your prescription refills have been printed and given to you to submit to your pharmacy. Please take as directed and contact our office if you believe you are having problem(s) with the medication(s). we'll make referral to [facility] orthopedics . Our office will contact you regarding appointment(s) once made. Please schedule followup in 4-6 months, call sooner if problems.

## 2011-07-31 NOTE — Patient Instructions (Addendum)
blood tests are being requested for you today.  You will receive a letter with results.   Please make a follow-up appointment in 6 months if ever you have fever while taking this medication, stop it and call us, because of the risk of a rare side-effect.

## 2011-07-31 NOTE — Progress Notes (Signed)
  Subjective:    Patient ID: Katie Higgins, female    DOB: 06-Apr-1950, 61 y.o.   MRN: [id number]  HPI  here for follow up - continued R wrist & shoulder pain - requests referral [facility] ortho and refills   Past Medical History  Diagnosis Date  . Palpitations   . Obesity   . CARDIOMYOPATHY     Nonischemic. Admitted in 3/11 with CHF exac, due to hyperthyroidism. LHC (03/11) showed clean coronaries with EF 40%. Myoview showed EF 38%. Echo was a technically difficult study with mild to moderately decreased EF, mild LVH, mild MR. Repeat echo (9/11) with EF 55-60%, mild diastolic dysfunction (grade I).  . Congestive heart failure, unspecified   . Osteoarthritis     esp L hip, low back  . ALLERGIC RHINITIS   . DIABETES MELLITUS, CONTROLLED   . DYSLIPIDEMIA   . HYPERTENSION   . HYPERTHYROIDISM   . CARPAL TUNNEL SYNDROME, LEFT, MILD      Review of Systems  Constitutional: Negative for fever and fatigue.  Respiratory: Negative for cough and wheezing.   Cardiovascular: Negative for chest pain and palpitations.  Neurological: Negative for dizziness and headaches.       Objective:   Physical Exam  BP 112/70  Pulse 86  Temp 98.3 F (36.8 C) (Oral)  Ht 5\' 2"  (1.575 m)  Wt 283 lb 6.4 oz (128.549 kg)  BMI 51.83 kg/m2  SpO2 96% Wt Readings from Last 3 Encounters:  07/31/11 283 lb 6.4 oz (128.549 kg)  05/10/11 283 lb 6.4 oz (128.549 kg)  05/04/11 283 lb 6.4 oz (128.549 kg)   Constitutional: She is overweight; appears well-developed and well-nourished. No distress. sitting in manual WC (usual)  Cardiovascular: Normal rate, regular rhythm and normal heart sounds.  No murmur heard. No BLE edema. Pulmonary/Chest: Effort normal and breath sounds normal. No respiratory distress. She has no wheezes.  Musculoskeletal: B shoulders with decreased range of motion on forward flexion, abduction, and internal rotation. Positive impingement signs. Decreased strength with stressing of rotator cuff. Pain  with crossed arm adduction. referred pain into distal deltoid. Tender over a.c. joint and subacromial.  Psychiatric: She has a normal mood and affect. Her behavior is normal. Judgment and thought content normal.   Lab Results  Component Value Date   WBC 9.3 09/20/2010   HGB 11.0* 09/20/2010   HCT 33.6* 09/20/2010   PLT 299 09/20/2010   CHOL 113 05/10/2011   TRIG 54.0 05/10/2011   HDL 43.30 05/10/2011   ALT 9 05/10/2011   AST 13 05/10/2011   NA 137 09/20/2010   K 4.8 09/20/2010   CL 104 09/20/2010   CREATININE 1.13* 09/20/2010   BUN 22 09/20/2010   CO2 23 09/20/2010   TSH 4.63 01/09/2011   INR 1.11 04/19/2009   HGBA1C 5.6 05/10/2011       Assessment & Plan:  See problem list. Medications and labs reviewed today.  B RTC tendonitis, R>L -chronic, exac by manual WC self maneuvering; declines steroid shot due to DM - ok to use occasional NSAIDs or tramadol as needed -  min improved norco prn (intol reviewed - disproved), renewed today -  Check xray wrist

## 2011-08-01 ENCOUNTER — Telehealth: Payer: Self-pay | Admitting: *Deleted

## 2011-08-01 NOTE — Telephone Encounter (Signed)
Called pt to inform of lab results, pt informed (letter also mailed to pt). Pt states that MD mentioned yesterday in OV that thyroid was swollen a bit. Pt is concerned and is wondering if anything needs to be done regarding the swelling.

## 2011-08-01 NOTE — Telephone Encounter (Signed)
The treatment would be to take radioactive iodine (which would require you to stop the medication), or to have surgery.  Either would not be safe for your heart.  Since the swelling is not harmful, please continue the same medication

## 2011-08-02 NOTE — Telephone Encounter (Signed)
Left message for pt to callback office.  

## 2011-08-03 NOTE — Telephone Encounter (Signed)
Pt's son informed of MD's advisement.

## 2011-10-31 ENCOUNTER — Telehealth: Payer: Self-pay | Admitting: Internal Medicine

## 2011-10-31 NOTE — Telephone Encounter (Signed)
Pt would like a refill of Hydrocodone sent to [facility] on [address]. Pt last seen by provider on 6/24 this is a historical med. Please advise.

## 2011-10-31 NOTE — Telephone Encounter (Signed)
Katie Higgins calling, would like to have her Hydrocodone script sent to [facility] on [address].  She normally gets it through mail order but wants to stop that with this drug.

## 2011-10-31 NOTE — Telephone Encounter (Signed)
Ok to call in

## 2011-11-01 MED ORDER — HYDROCODONE-ACETAMINOPHEN 5-325 MG PO TABS: 1.0000 | ORAL_TABLET | Freq: Four times a day (QID) | ORAL | Status: DC | PRN

## 2011-11-01 NOTE — Telephone Encounter (Signed)
Rx called in. Pt notified. 

## 2011-11-15 ENCOUNTER — Other Ambulatory Visit (INDEPENDENT_AMBULATORY_CARE_PROVIDER_SITE_OTHER)

## 2011-11-15 ENCOUNTER — Encounter: Payer: Self-pay | Admitting: Internal Medicine

## 2011-11-15 ENCOUNTER — Ambulatory Visit (INDEPENDENT_AMBULATORY_CARE_PROVIDER_SITE_OTHER): Admitting: Internal Medicine

## 2011-11-15 VITALS — BP 112/68 | HR 83 | Temp 98.2°F | Ht 62.0 in | Wt 286.0 lb

## 2011-11-15 DIAGNOSIS — Z0000 Encounter for general adult medical examination without abnormal findings: Secondary | ICD-10-CM

## 2011-11-15 DIAGNOSIS — E0590 Thyrotoxicosis, unspecified without thyrotoxic crisis or storm: Secondary | ICD-10-CM

## 2011-11-15 DIAGNOSIS — Z79899 Other long term (current) drug therapy: Secondary | ICD-10-CM

## 2011-11-15 DIAGNOSIS — E119 Type 2 diabetes mellitus without complications: Secondary | ICD-10-CM

## 2011-11-15 DIAGNOSIS — J309 Allergic rhinitis, unspecified: Secondary | ICD-10-CM

## 2011-11-15 DIAGNOSIS — Z23 Encounter for immunization: Secondary | ICD-10-CM

## 2011-11-15 LAB — CBC WITH DIFFERENTIAL/PLATELET
Basophils Absolute: 0.1 10*3/uL (ref 0.0–0.1)
Eosinophils Absolute: 0.5 10*3/uL (ref 0.0–0.7)
HCT: 34.3 % — ABNORMAL LOW (ref 36.0–46.0)
Lymphocytes Relative: 27 % (ref 12.0–46.0)
Lymphs Abs: 2.3 10*3/uL (ref 0.7–4.0)
MCHC: 32.4 g/dL (ref 30.0–36.0)
Monocytes Relative: 8.8 % (ref 3.0–12.0)
Platelets: 273 10*3/uL (ref 150.0–400.0)
RDW: 15 % — ABNORMAL HIGH (ref 11.5–14.6)

## 2011-11-15 LAB — HEPATIC FUNCTION PANEL
ALT: 12 U/L (ref 0–35)
Bilirubin, Direct: 0.2 mg/dL (ref 0.0–0.3)
Total Bilirubin: 0.9 mg/dL (ref 0.3–1.2)

## 2011-11-15 LAB — HEMOGLOBIN A1C: Hgb A1c MFr Bld: 5.6 % (ref 4.6–6.5)

## 2011-11-15 LAB — TSH: TSH: 4.7 u[IU]/mL (ref 0.35–5.50)

## 2011-11-15 LAB — BASIC METABOLIC PANEL
BUN: 25 mg/dL — ABNORMAL HIGH (ref 6–23)
Calcium: 8.5 mg/dL (ref 8.4–10.5)
Chloride: 106 mEq/L (ref 96–112)
Creatinine, Ser: 1.4 mg/dL — ABNORMAL HIGH (ref 0.4–1.2)
GFR: 39.5 mL/min — ABNORMAL LOW (ref >60.00)

## 2011-11-15 MED ORDER — LORATADINE 10 MG PO TABS: 10.0000 mg | ORAL_TABLET | Freq: Every day | ORAL | Status: DC

## 2011-11-15 MED ORDER — AMITRIPTYLINE HCL 25 MG PO TABS: 25.0000 mg | ORAL_TABLET | Freq: Every day | ORAL | Status: DC

## 2011-11-15 MED ORDER — CYCLOBENZAPRINE HCL 10 MG PO TABS: 10.0000 mg | ORAL_TABLET | Freq: Three times a day (TID) | ORAL | Status: DC | PRN

## 2011-11-15 NOTE — Assessment & Plan Note (Signed)
On MMZ, improved mgmt per endo Check labs now including CBC Lab Results  Component Value Date   TSH 2.92 07/31/2011

## 2011-11-15 NOTE — Progress Notes (Signed)
Subjective:    Patient ID: Katie Higgins, female    DOB: 1951-02-03, 61 y.o.   MRN: [id number]  HPI here for follow up -  Also reviewed chronic medical issues:  CHF hx - hosp 04/2009 due to same exacerbated by hyperthyroidism s/p cardiac cath for same - ongoing med mgmt without recurrent shortness of breath or swelling -  following with cards - reports 100% compliance with meds -   Hyperthyroidism/Graves' disease - dx during 04/2009 hosp for chf- follows with endo -  On methimazole, unable to stop medication for I131 ablation reports compliance with ongoing medical treatment and no changes in medication dose or frequency.  denies adverse side effects related to current therapy.   hypertension - reports compliance with ongoing medical treatment and no changes in medication dose or frequency. denies adverse side effects related to current therapy.   dyslipidemia - on lovastatin. reports compliance with ongoing medical treatment and no changes in medication dose or frequency. denies adverse side effects related to current therapy.   diet DM2 - likely related to hyperthyroid per pt - checks sugars 3x/week - AM 110-114 - diet control only at this time   Past Medical History  Diagnosis Date  . Palpitations   . Obesity   . CARDIOMYOPATHY     Nonischemic. Admitted in 3/11 with CHF exac, due to hyperthyroidism. LHC (03/11) showed clean coronaries with EF 40%. Myoview showed EF 38%. Echo was a technically difficult study with mild to moderately decreased EF, mild LVH, mild MR. Repeat echo (9/11) with EF 55-60%, mild diastolic dysfunction (grade I).  . Congestive heart failure, unspecified   . Osteoarthritis     esp L hip, low back  . ALLERGIC RHINITIS   . DIABETES MELLITUS, CONTROLLED   . DYSLIPIDEMIA   . HYPERTENSION   . HYPERTHYROIDISM   . CARPAL TUNNEL SYNDROME, LEFT, MILD    Family History  Problem Relation Age of Onset  . Diabetes Mother   . Cancer Mother     Breast Cancer,  Cervicial Cancer  . Cancer Father     Kidney Cancer  . Stroke Father     CVA   History  Substance Use Topics  . Smoking status: Never Smoker   . Smokeless tobacco: Not on file   Comment: Widowed since 1992. Lives in [place] with her son. She is very sedentary, uses a wheelchair when out of the house. Ambulatory in home with cane.  . Alcohol Use: No    Review of Systems  Constitutional: Negative for fever.  Respiratory: Negative for cough and wheezing.   Cardiovascular: Negative for palpitations.  Gastrointestinal: Negative for abdominal pain, no bowel changes.  Musculoskeletal: Negative for gait problem or joint swelling.  Skin: Negative for rash.  Neurological: Negative for dizziness or headache.  No other specific complaints in a complete review of systems (except as listed in HPI above).      Objective:   Physical Exam  BP 112/68  Pulse 83  Temp 98.2 F (36.8 C) (Oral)  Ht 5\' 2"  (1.575 m)  Wt 286 lb (129.729 kg)  BMI 52.31 kg/m2  SpO2 96% Wt Readings from Last 3 Encounters:  11/15/11 286 lb (129.729 kg)  07/31/11 283 lb (128.368 kg)  07/31/11 283 lb 6.4 oz (128.549 kg)   Constitutional: She is overweight; appears well-developed and well-nourished. No distress. sitting in manual WC (usual)  HENT: NCAT, mildly tender over frontal and maxillary sinuses to palpation. Nares with clear discharge, no erythema or purulence.  Tympanic membranes with clear effusion, no erythema. No cerumen impaction OP without erythema or exudate but clear PND evident Eyes: Wears corrective lenses. PERRLA, EOMI. Vision grossly intact. No conjunctivitis or icterus Neck: Mild thyromegaly, nontender. No appreciable nodules. No JVD or LAD. Full range of motion Cardiovascular: Normal rate, regular rhythm and normal heart sounds.  No murmur heard. No BLE edema. Pulmonary/Chest: Effort normal and breath sounds normal. No respiratory distress. She has no wheezes.  Abdomen: Large pannus. Soft, nontender,  positive bowel sounds  Musculoskeletal: No gross deformities. B shoulders with decreased range of motion on forward flexion, abduction, and internal rotation. Positive impingement signs. Decreased strength with stressing of rotator cuff. Pain with crossed arm adduction. referred pain into distal deltoid. Tender over a.c. joint and subacromial.  Neurologic : Awake alert and oriented x4, no gross motor deficits. Moves all extremities well. Cranial nerves II through XII symmetrically intact. Coordination and speech normal  Psychiatric: She has a normal mood and affect. Her behavior is normal. Judgment and thought content normal.   Lab Results  Component Value Date   WBC 9.3 09/20/2010   HGB 11.0* 09/20/2010   HCT 33.6* 09/20/2010   PLT 299 09/20/2010   CHOL 113 05/10/2011   TRIG 54.0 05/10/2011   HDL 43.30 05/10/2011   ALT 9 05/10/2011   AST 13 05/10/2011   NA 137 09/20/2010   K 4.8 09/20/2010   CL 104 09/20/2010   CREATININE 1.13* 09/20/2010   BUN 22 09/20/2010   CO2 23 09/20/2010   TSH 2.92 07/31/2011   INR 1.11 04/19/2009   HGBA1C 5.6 05/10/2011       Assessment & Plan:  CPX/v70.0 - Today patient counseled on age appropriate routine health concerns for screening and prevention, each reviewed and up to date or declined. Immunizations reviewed and up to date or declined. Labs ordered and reviewed. Risk factors for depression reviewed and negative. Hearing function and visual acuity are intact. ADLs screened and addressed as needed. Functional ability and level of safety reviewed and appropriate. Education, counseling and referrals performed based on assessed risks today. Patient provided with a copy of personalized plan for preventive services.  Also see problem list. Medications and labs reviewed today.

## 2011-11-15 NOTE — Patient Instructions (Addendum)
It was good to see you today. We have reviewed your prior records including labs and tests today Medications reviewed and updated: Stop Benadryl and use Claritin daily for allergy symptoms, increase dose amitriptyline for sleep -no other changes recommended today Refill on medication(s) as discussed today. Health Maintenance reviewed - flu shot given today  -all other recommended immunizations and age-appropriate screenings are up-to-date or declined. Test(s) ordered today. Your results will be released to MyChart (or called to you) after review, usually within 72hours after test completion. If any changes need to be made, you will be notified at that same time. Please schedule followup in 6 months, call sooner if problems. Followup with Dr. Everardo All and cardiology specialists as ongoing Health Maintenance, Females A healthy lifestyle and preventative care can promote health and wellness.  Maintain regular health, dental, and eye exams.   Eat a healthy diet. Foods like vegetables, fruits, whole grains, low-fat dairy products, and lean protein foods contain the nutrients you need without too many calories. Decrease your intake of foods high in solid fats, added sugars, and salt. Get information about a proper diet from your caregiver, if necessary.   Regular physical exercise is one of the most important things you can do for your health. Most adults should get at least 150 minutes of moderate-intensity exercise (any activity that increases your heart rate and causes you to sweat) each week. In addition, most adults need muscle-strengthening exercises on 2 or more days a week.     Maintain a healthy weight. The body mass index (BMI) is a screening tool to identify possible weight problems. It provides an estimate of body fat based on height and weight. Your caregiver can help determine your BMI, and can help you achieve or maintain a healthy weight. For adults 20 years and older:   A BMI below 18.5  is considered underweight.   A BMI of 18.5 to 24.9 is normal.   A BMI of 25 to 29.9 is considered overweight.   A BMI of 30 and above is considered obese.   Maintain normal blood lipids and cholesterol by exercising and minimizing your intake of saturated fat. Eat a balanced diet with plenty of fruits and vegetables. Blood tests for lipids and cholesterol should begin at age 35 and be repeated every 5 years. If your lipid or cholesterol levels are high, you are over 50, or you are a high risk for heart disease, you may need your cholesterol levels checked more frequently. Ongoing high lipid and cholesterol levels should be treated with medicines if diet and exercise are not effective.   If you smoke, find out from your caregiver how to quit. If you do not use tobacco, do not start.   If you are pregnant, do not drink alcohol. If you are breastfeeding, be very cautious about drinking alcohol. If you are not pregnant and choose to drink alcohol, do not exceed 1 drink per day. One drink is considered to be 12 ounces (355 mL) of beer, 5 ounces (148 mL) of wine, or 1.5 ounces (44 mL) of liquor.   Avoid use of street drugs. Do not share needles with anyone. Ask for help if you need support or instructions about stopping the use of drugs.   High blood pressure causes heart disease and increases the risk of stroke. Blood pressure should be checked at least every 1 to 2 years. Ongoing high blood pressure should be treated with medicines, if weight loss and exercise are not effective.  If you are 52 to 62 years old, ask your caregiver if you should take aspirin to prevent strokes.   Diabetes screening involves taking a blood sample to check your fasting blood sugar level. This should be done once every 3 years, after age 74, if you are within normal weight and without risk factors for diabetes. Testing should be considered at a younger age or be carried out more frequently if you are overweight and have  at least 1 risk factor for diabetes.   Breast cancer screening is essential preventative care for women. You should practice "breast self-awareness." This means understanding the normal appearance and feel of your breasts and may include breast self-examination. Any changes detected, no matter how small, should be reported to a caregiver. Women in their 65s and 30s should have a clinical breast exam (CBE) by a caregiver as part of a regular health exam every 1 to 3 years. After age 22, women should have a CBE every year. Starting at age 20, women should consider having a mammogram (breast X-ray) every year. Women who have a family history of breast cancer should talk to their caregiver about genetic screening. Women at a high risk of breast cancer should talk to their caregiver about having an MRI and a mammogram every year.   The Pap test is a screening test for cervical cancer. Women should have a Pap test starting at age 5. Between ages 40 and 29, Pap tests should be repeated every 2 years. Beginning at age 61, you should have a Pap test every 3 years as long as the past 3 Pap tests have been normal. If you had a hysterectomy for a problem that was not cancer or a condition that could lead to cancer, then you no longer need Pap tests. If you are between ages 64 and 37, and you have had normal Pap tests going back 10 years, you no longer need Pap tests. If you have had past treatment for cervical cancer or a condition that could lead to cancer, you need Pap tests and screening for cancer for at least 20 years after your treatment. If Pap tests have been discontinued, risk factors (such as a new sexual partner) need to be reassessed to determine if screening should be resumed. Some women have medical problems that increase the chance of getting cervical cancer. In these cases, your caregiver may recommend more frequent screening and Pap tests.   The human papillomavirus (HPV) test is an additional test that  may be used for cervical cancer screening. The HPV test looks for the virus that can cause the cell changes on the cervix. The cells collected during the Pap test can be tested for HPV. The HPV test could be used to screen women aged 65 years and older, and should be used in women of any age who have unclear Pap test results. After the age of 30, women should have HPV testing at the same frequency as a Pap test.   Colorectal cancer can be detected and often prevented. Most routine colorectal cancer screening begins at the age of 40 and continues through age 71. However, your caregiver may recommend screening at an earlier age if you have risk factors for colon cancer. On a yearly basis, your caregiver may provide home test kits to check for hidden blood in the stool. Use of a small camera at the end of a tube, to directly examine the colon (sigmoidoscopy or colonoscopy), can detect the earliest forms of  colorectal cancer. Talk to your caregiver about this at age 14, when routine screening begins. Direct examination of the colon should be repeated every 5 to 10 years through age 70, unless early forms of pre-cancerous polyps or small growths are found.   Hepatitis C blood testing is recommended for all people born from [birth year] through 1965 and any individual with known risks for hepatitis C.   Practice safe sex. Use condoms and avoid high-risk sexual practices to reduce the spread of sexually transmitted infections (STIs). Sexually active women aged 54 and younger should be checked for Chlamydia, which is a common sexually transmitted infection. Older women with new or multiple partners should also be tested for Chlamydia. Testing for other STIs is recommended if you are sexually active and at increased risk.   Osteoporosis is a disease in which the bones lose minerals and strength with aging. This can result in serious bone fractures. The risk of osteoporosis can be identified using a bone density scan. Women  ages 34 and over and women at risk for fractures or osteoporosis should discuss screening with their caregivers. Ask your caregiver whether you should be taking a calcium supplement or vitamin D to reduce the rate of osteoporosis.   Menopause can be associated with physical symptoms and risks. Hormone replacement therapy is available to decrease symptoms and risks. You should talk to your caregiver about whether hormone replacement therapy is right for you.   Use sunscreen with a sun protection factor (SPF) of 30 or greater. Apply sunscreen liberally and repeatedly throughout the day. You should seek shade when your shadow is shorter than you. Protect yourself by wearing long sleeves, pants, a wide-brimmed hat, and sunglasses year round, whenever you are outdoors.   Notify your caregiver of new moles or changes in moles, especially if there is a change in shape or color. Also notify your caregiver if a mole is larger than the size of a pencil eraser.   Stay current with your immunizations.  Document Released: 08/08/2010 Document Revised: 04/17/2011 Document Reviewed: 08/08/2010 [facility] Patient Information 2013 [place], [place].   Headache and Allergies The relationship between allergies and headaches is unclear. Many people with allergic or infectious nasal problems also have headaches (migraines or sinus headaches). However, sometimes allergies can cause pressure that feels like a headache, and sometimes headaches can cause allergy-like symptoms. It is not always clear whether your symptoms are caused by allergies or by a headache. CAUSES    Migraine: The cause of a migraine is not always known.   Sinus Headache: The cause of a sinus headache may be a sinus infection. Other conditions that may be related to sinus headaches include:   Hay fever (allergic rhinitis).   Deviation of the nasal septum.   Swelling or clogging of the nasal passages.  SYMPTOMS   Migraine headache symptoms (which  often last 4 to 72 hours) include:  Intense, throbbing pain on one or both sides of the head.   Nausea.   Vomiting.   Being extra sensitive to light.   Being extra sensitive to sound.   Nervous system reactions that appear similar to an allergic reaction:   Stuffy nose.   Runny nose.   Tearing.  Sinus headaches are felt as facial pain or pressure.   DIAGNOSIS   Because there is some overlap in symptoms, sinus and migraine headaches are often misdiagnosed. For example, a person with migraines may also feel facial pressure. Likewise, many people with hay fever may get  migraine headaches rather than sinus headaches. These migraines can be triggered by the histamine release during an allergic reaction. An antihistamine medicine can eliminate this pain. There are standard criteria that help clarify the difference between these headaches and related allergy or allergy-like symptoms. Your caregiver can use these criteria to determine the proper diagnosis and provide you the best care. TREATMENT   Migraine medicine may help people who have persistent migraine headaches even though their hay fever is controlled. For some people, anti-inflammatory treatments do not work to relieve migraines. Medicines called triptans (such as sumatriptan) can be helpful for those people. Document Released: 04/15/2003 Document Revised: 04/17/2011 Document Reviewed: 05/07/2009 [facility] Patient Information 2013 [place], [place].

## 2011-11-15 NOTE — Assessment & Plan Note (Signed)
Diet controlled - importance of diet and weight control reviewed - On ACEI and statin + ASA 81 Check a1c today - Lab Results  Component Value Date   HGBA1C 5.6 05/10/2011

## 2011-11-15 NOTE — Assessment & Plan Note (Signed)
Increase in seasonal symptoms associated with mild frontal allergies and postnasal drip. Will change Benadryl to H2 blocker for less sedation side effects Patient to call if continued or worsening problems despite this medication change

## 2012-01-23 ENCOUNTER — Ambulatory Visit: Admitting: Endocrinology

## 2012-02-20 ENCOUNTER — Ambulatory Visit (INDEPENDENT_AMBULATORY_CARE_PROVIDER_SITE_OTHER): Admitting: Endocrinology

## 2012-02-20 VITALS — BP 118/80 | HR 76 | Temp 97.8°F | Wt 292.0 lb

## 2012-02-20 DIAGNOSIS — E041 Nontoxic single thyroid nodule: Secondary | ICD-10-CM | POA: Insufficient documentation

## 2012-02-20 DIAGNOSIS — E0590 Thyrotoxicosis, unspecified without thyrotoxic crisis or storm: Secondary | ICD-10-CM

## 2012-02-20 LAB — TSH: TSH: 2.04 u[IU]/mL (ref 0.35–5.50)

## 2012-02-20 NOTE — Progress Notes (Signed)
Subjective:    Patient ID: Katie Higgins, female    DOB: Dec 05, 1950, 62 y.o.   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  She has been unable to d/c tapazole for i-131 rx, due to chf.  pt states she feels well in general, except for a recent URI.   Past Medical History  Diagnosis Date  . Palpitations   . Obesity   . CARDIOMYOPATHY     Nonischemic. Admitted in 3/11 with CHF exac, due to hyperthyroidism. LHC (03/11) showed clean coronaries with EF 40%. Myoview showed EF 38%. Echo was a technically difficult study with mild to moderately decreased EF, mild LVH, mild MR. Repeat echo (9/11) with EF 55-60%, mild diastolic dysfunction (grade I).  . Congestive heart failure, unspecified   . Osteoarthritis     esp L hip, low back  . ALLERGIC RHINITIS   . DIABETES MELLITUS, CONTROLLED   . DYSLIPIDEMIA   . HYPERTENSION   . HYPERTHYROIDISM   . CARPAL TUNNEL SYNDROME, LEFT, MILD     Past Surgical History  Procedure Date  . No past surgeries     History   Social History  . Marital Status: Widowed    Spouse Name: N/A    Number of Children: N/A  . Years of Education: N/A   Occupational History  . Not on file.   Social History Main Topics  . Smoking status: Never Smoker   . Smokeless tobacco: Not on file     Comment: Widowed since 1992. Lives in [place] with her son. She is very sedentary, uses a wheelchair when out of the house. Ambulatory in home with cane.  . Alcohol Use: No  . Drug Use: No  . Sexually Active:    Other Topics Concern  . Not on file   Social History Narrative   Lives in [place] with her son.Widowed since 1992.She is very sedentary, uses a wheelchair when out of the house.Ambulatory in home with cane.    Current Outpatient Prescriptions on File Prior to Visit  Medication Sig Dispense Refill  . amitriptyline (ELAVIL) 25 MG tablet Take 1 tablet (25 mg total) by mouth at bedtime.   90 tablet  1  . aspirin 81 MG tablet Take 81 mg by mouth daily.        . carvedilol (COREG) 25 MG tablet Take 1 tablet (25 mg total) by mouth 2 (two) times daily with a meal.  180 tablet  3  . diphenhydrAMINE (BENADRYL) 25 mg capsule Take 25 mg by mouth every 4 (four) hours as needed. For allergies       . enalapril (VASOTEC) 10 MG tablet Take 1 tablet (10 mg total) by mouth daily.  90 tablet  3  . furosemide (LASIX) 20 MG tablet Take 1 tablet (20 mg total) by mouth daily.  90 tablet  3  . loratadine (CLARITIN) 10 MG tablet Take 1 tablet (10 mg total) by mouth daily.  30 tablet  11  . lovastatin (MEVACOR) 10 MG tablet Take 1 tablet (10 mg total) by mouth daily.  90 tablet  3  . Multiple Vitamin (MULTIVITAMIN) tablet Take 1 tablet by mouth daily.        . Naproxen Sodium (ALEVE) 220 MG CAPS Take 1 capsule by mouth 2 (two) times daily as needed.       . cyclobenzaprine (FLEXERIL) 10 MG tablet Take 1 tablet (10 mg total) by  mouth every 8 (eight) hours as needed for muscle spasms.  90 tablet  1  . methimazole (TAPAZOLE) 10 MG tablet Take 1 tablet (10 mg total) by mouth daily.  90 tablet  2  . traMADol (ULTRAM) 50 MG tablet Take 1 tablet (50 mg total) by mouth every 6 (six) hours as needed for pain.  270 tablet  0    No Known Allergies  Family History  Problem Relation Age of Onset  . Diabetes Mother   . Cancer Mother     Breast Cancer, Cervicial Cancer  . Cancer Father     Kidney Cancer  . Stroke Father     CVA   BP 118/80  Pulse 76  Temp 97.8 F (36.6 C) (Oral)  Wt 292 lb (132.45 kg)  SpO2 96%  Review of Systems Denies fever.      Objective:   Physical Exam VITAL SIGNS:  See vs page GENERAL: no distress NECK: There is no palpable thyroid enlargement.  No thyroid nodule is palpable.  No palpable lymphadenopathy at the anterior neck.    Lab Results  Component Value Date   TSH 2.04 02/20/2012      Assessment & Plan:  Hyperthyroidism: well-controlled

## 2012-02-20 NOTE — Patient Instructions (Addendum)
blood tests are being requested for you today.  We'll contact you with results.   Please make a follow-up appointment in 6 months.   if ever you have fever while taking this medication, stop it and call us, because of the risk of a rare side-effect.   Let's recheck the ultrasound.  you will receive a phone call, about a day and time for an appointment Refill [facility]

## 2012-02-21 ENCOUNTER — Telehealth: Payer: Self-pay | Admitting: *Deleted

## 2012-02-21 ENCOUNTER — Encounter: Payer: Self-pay | Admitting: Internal Medicine

## 2012-02-21 ENCOUNTER — Encounter: Payer: Self-pay | Admitting: Endocrinology

## 2012-02-21 MED ORDER — TRAMADOL HCL 50 MG PO TABS: 50.0000 mg | ORAL_TABLET | Freq: Four times a day (QID) | ORAL | Status: DC | PRN

## 2012-02-21 MED ORDER — CYCLOBENZAPRINE HCL 10 MG PO TABS: 10.0000 mg | ORAL_TABLET | Freq: Three times a day (TID) | ORAL | Status: DC | PRN

## 2012-02-21 NOTE — Telephone Encounter (Signed)
Received email needing refill on her tramadol & flexeril sent to mail service...Katie Higgins

## 2012-02-22 ENCOUNTER — Other Ambulatory Visit: Payer: Self-pay | Admitting: Endocrinology

## 2012-02-22 MED ORDER — METHIMAZOLE 10 MG PO TABS: 10.0000 mg | ORAL_TABLET | Freq: Every day | ORAL | Status: DC

## 2012-02-22 NOTE — Telephone Encounter (Signed)
Faxed script to meds by mail @ [phone]...lmb

## 2012-02-26 NOTE — Telephone Encounter (Signed)
Updated pharmacy

## 2012-03-01 ENCOUNTER — Other Ambulatory Visit

## 2012-03-05 ENCOUNTER — Encounter: Payer: Self-pay | Admitting: Endocrinology

## 2012-03-06 ENCOUNTER — Other Ambulatory Visit: Payer: Self-pay | Admitting: *Deleted

## 2012-03-06 MED ORDER — METHIMAZOLE 10 MG PO TABS: 10.0000 mg | ORAL_TABLET | Freq: Every day | ORAL | Status: DC

## 2012-03-09 ENCOUNTER — Encounter: Payer: Self-pay | Admitting: Internal Medicine

## 2012-03-11 ENCOUNTER — Other Ambulatory Visit: Payer: Self-pay | Admitting: *Deleted

## 2012-03-11 MED ORDER — HYDROCODONE-ACETAMINOPHEN 5-325 MG PO TABS: 1.0000 | ORAL_TABLET | Freq: Three times a day (TID) | ORAL | Status: DC | PRN

## 2012-03-11 NOTE — Telephone Encounter (Signed)
Faxed hydrocodone script to [facility]...Raechel Chute

## 2012-03-11 NOTE — Telephone Encounter (Signed)
Lucy, please send hydrocodone to mail-order pharmacy as requested -printed and signed

## 2012-04-17 ENCOUNTER — Encounter: Payer: Self-pay | Admitting: Internal Medicine

## 2012-04-17 ENCOUNTER — Other Ambulatory Visit: Payer: Self-pay | Admitting: *Deleted

## 2012-04-17 MED ORDER — AMITRIPTYLINE HCL 25 MG PO TABS: 25.0000 mg | ORAL_TABLET | Freq: Every day | ORAL | Status: DC

## 2012-04-17 MED ORDER — HYDROCODONE-ACETAMINOPHEN 5-325 MG PO TABS: 1.0000 | ORAL_TABLET | Freq: Three times a day (TID) | ORAL | Status: DC | PRN

## 2012-04-17 NOTE — Telephone Encounter (Signed)
Received e-mail pt is wanting refill on her amitriptyline & hydrocodone. Have appt schedule on 05/15/12, but will be out of med by then. Already sent amitriptyline pain med need approval...lmb

## 2012-04-18 NOTE — Telephone Encounter (Signed)
Faxed hydrocodone rx bck to [facility]...Raechel Chute

## 2012-04-19 ENCOUNTER — Encounter: Payer: Self-pay | Admitting: Internal Medicine

## 2012-05-03 ENCOUNTER — Encounter: Payer: Self-pay | Admitting: *Deleted

## 2012-05-08 ENCOUNTER — Encounter: Payer: Self-pay | Admitting: Cardiology

## 2012-05-08 ENCOUNTER — Ambulatory Visit (INDEPENDENT_AMBULATORY_CARE_PROVIDER_SITE_OTHER): Admitting: Cardiology

## 2012-05-08 VITALS — BP 122/80 | HR 93 | Ht 62.0 in | Wt 291.0 lb

## 2012-05-08 DIAGNOSIS — I5032 Chronic diastolic (congestive) heart failure: Secondary | ICD-10-CM

## 2012-05-08 DIAGNOSIS — E669 Obesity, unspecified: Secondary | ICD-10-CM

## 2012-05-08 DIAGNOSIS — I428 Other cardiomyopathies: Secondary | ICD-10-CM

## 2012-05-08 DIAGNOSIS — E785 Hyperlipidemia, unspecified: Secondary | ICD-10-CM

## 2012-05-08 LAB — BASIC METABOLIC PANEL WITH GFR
BUN: 37 mg/dL — ABNORMAL HIGH (ref 6–23)
CO2: 24 meq/L (ref 19–32)
Calcium: 8.6 mg/dL (ref 8.4–10.5)
Chloride: 105 meq/L (ref 96–112)
Creatinine, Ser: 1.5 mg/dL — ABNORMAL HIGH (ref 0.4–1.2)
GFR: 38.2 mL/min — ABNORMAL LOW
Glucose, Bld: 97 mg/dL (ref 70–99)
Potassium: 4.7 meq/L (ref 3.5–5.1)
Sodium: 138 meq/L (ref 135–145)

## 2012-05-08 LAB — LIPID PANEL
Cholesterol: 110 mg/dL (ref 0–200)
HDL: 38 mg/dL — ABNORMAL LOW
LDL Cholesterol: 60 mg/dL (ref 0–99)
Total CHOL/HDL Ratio: 3
Triglycerides: 61 mg/dL (ref 0.0–149.0)
VLDL: 12.2 mg/dL (ref 0.0–40.0)

## 2012-05-08 MED ORDER — ENALAPRIL MALEATE 10 MG PO TABS: 10.0000 mg | ORAL_TABLET | Freq: Every day | ORAL | Status: DC

## 2012-05-08 MED ORDER — CARVEDILOL 25 MG PO TABS: 25.0000 mg | ORAL_TABLET | Freq: Two times a day (BID) | ORAL | Status: DC

## 2012-05-08 MED ORDER — LOVASTATIN 10 MG PO TABS: 10.0000 mg | ORAL_TABLET | Freq: Every day | ORAL | Status: DC

## 2012-05-08 MED ORDER — FUROSEMIDE 20 MG PO TABS: 20.0000 mg | ORAL_TABLET | Freq: Every day | ORAL | Status: DC

## 2012-05-08 NOTE — Progress Notes (Signed)
Patient ID: Katie Higgins, female   DOB: 1950/12/19, 62 y.o.   MRN: [id number] PCP: Dr. Felicity Coyer  62 yo with history of obesity was admitted to [facility] in 3/11 with shortness of breath. She was found to have a CHF exacerbation. TSH was noted to be suppressed and free T4 was very high. Patient was begun on methimazole and diuresed. Echo was a difficult study but showed mild to moderately decreased EF. Myoview showed EF 38% with multiple fixed deficits. Left heart cath showed no significant coronary disease with EF 40%. It was thought that her cardiomyopathy may be the result of hyperthyroidism. Hyperthyroidism is now being treated by Dr. Everardo All. Repeat echo in 9/11 showed EF 55-60%.  She is stable symptomatically. She has been in a wheelchair when outside the house for a long time due to low back and hip arthritis. She has refused total hip replacement in the past. She is able to walk short distances in her house without dyspnea. 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 Unfortunately, she continues to gain weight.  She is up 8 lbs since I last saw her.     Labs (3/11): K 4, creatinine 0.7, BNP 603=>230, free T4 3.2 (high), free T3 8.9 (high), TSH 0.008, LDL 49, HDL 45, HCT 33.5  Labs (4/11): k 3.8, creatinine 0.8, BNP 47  Labs (8/11): creatinine 1.3  Labs (8/12): K 4.8, creatinine 1.13 Labs (12/12): TSH normal Labs (4/13): LDL 59, HDL 43 Labs (10/13): K 5.5, creatinine 1.4 Labs (1/14): TSH normal  ECG: NSR with a PVC, otherwise normal  Allergies (verified):  1) ! * Hydrocodone/apap   Past Medical History:  1. DM (ICD-250.00): diet-controlled  2. HYPERTENSION (ICD-401.9)  3. HYPERTHYROIDISM (ICD-242.90)  4. CARDIOMYOPATHY (ICD-425.4): Nonischemic. Admitted in 3/11 with CHF exac, due to hyperthyroidism. LHC (3/11) showed clean coronaries with EF 40%. Myoview showed EF 38%. Echo was a technically difficult study with mild to moderately decreased EF, mild LVH, mild MR. Repeat echo  (9/11) with EF 55-60%, mild diastolic dysfunction (grade I).  5. Osteoarthritis - esp L hip, low back  6. Obesity  7. s/p CCY  8. CKD  Family History:  No premature CAD  mom - expired age 75y - DM, breast cancer, cervical cancer  dad - expired age 22y - CVA, kidney cancer   Social History:  Lives in [place] with her son.  widowed since 1992  She is very sedentary, uses a wheelchair when out of the house.  ambulatory in home with cane  No smoking or ETOH.   Current Outpatient Prescriptions  Medication Sig Dispense Refill  . amitriptyline (ELAVIL) 25 MG tablet Take 1 tablet (25 mg total) by mouth at bedtime.  90 tablet  0  . aspirin 81 MG tablet Take 81 mg by mouth daily.        . carvedilol (COREG) 25 MG tablet Take 1 tablet (25 mg total) by mouth 2 (two) times daily with a meal.  180 tablet  3  . cyclobenzaprine (FLEXERIL) 10 MG tablet Take 1 tablet (10 mg total) by mouth every 8 (eight) hours as needed for muscle spasms.  90 tablet  1  . diphenhydrAMINE (BENADRYL) 25 mg capsule Take 25 mg by mouth every 4 (four) hours as needed. For allergies       . enalapril (VASOTEC) 10 MG tablet Take 1 tablet (10 mg total) by mouth daily.  90 tablet  3  . furosemide (LASIX) 20 MG tablet Take  1 tablet (20 mg total) by mouth daily.  90 tablet  3  . HYDROCODONE-ACETAMINOPHEN PO Take by mouth.      . loratadine (CLARITIN) 10 MG tablet Take 1 tablet (10 mg total) by mouth daily.  30 tablet  11  . lovastatin (MEVACOR) 10 MG tablet Take 1 tablet (10 mg total) by mouth daily.  90 tablet  3  . methimazole (TAPAZOLE) 10 MG tablet Take 1 tablet (10 mg total) by mouth daily.  90 tablet  3  . Multiple Vitamin (MULTIVITAMIN) tablet Take 1 tablet by mouth daily.        . Naproxen Sodium (ALEVE) 220 MG CAPS Take 1 capsule by mouth 2 (two) times daily as needed.       . traMADol (ULTRAM) 50 MG tablet Take 1 tablet (50 mg total) by mouth every 6 (six) hours as needed for pain.  270 tablet  0   No current  facility-administered medications for this visit.    BP 122/80  Pulse 93  Ht 5\' 2"  (1.575 m)  Wt 291 lb (131.997 kg)  BMI 53.21 kg/m2 General: NAD, morbidly obese.  Neck: No JVD, no thyromegaly or thyroid nodule.  Lungs: Clear to auscultation bilaterally with normal respiratory effort. CV: Nondisplaced PMI.  Heart regular S1/S2, no S3/S4, no murmur.  Trace ankle edema.  No carotid bruit.  Normal pedal pulses.  Abdomen: Soft, nontender, no hepatosplenomegaly, no distention.   Neurologic: Alert and oriented x 3.  Psych: Normal affect. Extremities: No clubbing or cyanosis.   Assessment/Plan:  1. Morbid obesity: Patient continues to gain weight.  This is her single biggest problem at this time.  We talked about dietary changes.  She is somewhat limited orthopedically, so I am not sure she is going to be able to increase her exercise level.   2. Cardiomyopathy: Resolved after treatment of hyperthyroidism.  Stable exertional symptoms (mostly limited by orthopedic problems).  3. Hyperlipidemia: I will check lipids today.  4. CKD: Elevated K and creatinine on last BMET, will repeat today.   Marca Ancona 05/08/2012

## 2012-05-08 NOTE — Patient Instructions (Addendum)
Your physician recommends that you have a FASTING lipid profile /BMET today.  Your physician wants you to follow-up in: 1 year with Dr Shirlee Latch. (April 2015). You will receive a reminder letter in the mail two months in advance. If you don't receive a letter, please call our office to schedule the follow-up appointment.

## 2012-05-15 ENCOUNTER — Other Ambulatory Visit (INDEPENDENT_AMBULATORY_CARE_PROVIDER_SITE_OTHER)

## 2012-05-15 ENCOUNTER — Encounter: Payer: Self-pay | Admitting: Internal Medicine

## 2012-05-15 ENCOUNTER — Ambulatory Visit (INDEPENDENT_AMBULATORY_CARE_PROVIDER_SITE_OTHER): Admitting: Internal Medicine

## 2012-05-15 VITALS — BP 114/72 | HR 78 | Temp 98.7°F | Wt 290.6 lb

## 2012-05-15 DIAGNOSIS — E0590 Thyrotoxicosis, unspecified without thyrotoxic crisis or storm: Secondary | ICD-10-CM

## 2012-05-15 DIAGNOSIS — E669 Obesity, unspecified: Secondary | ICD-10-CM

## 2012-05-15 DIAGNOSIS — E119 Type 2 diabetes mellitus without complications: Secondary | ICD-10-CM

## 2012-05-15 DIAGNOSIS — G4700 Insomnia, unspecified: Secondary | ICD-10-CM

## 2012-05-15 LAB — CBC WITH DIFFERENTIAL/PLATELET
Basophils Absolute: 0.1 10*3/uL (ref 0.0–0.1)
Lymphocytes Relative: 22.7 % (ref 12.0–46.0)
Lymphs Abs: 2 10*3/uL (ref 0.7–4.0)
Monocytes Relative: 8.4 % (ref 3.0–12.0)
Platelets: 311 10*3/uL (ref 150.0–400.0)
RDW: 14.5 % (ref 11.5–14.6)

## 2012-05-15 LAB — TSH: TSH: 2.71 u[IU]/mL (ref 0.35–5.50)

## 2012-05-15 LAB — HEMOGLOBIN A1C: Hgb A1c MFr Bld: 5.7 % (ref 4.6–6.5)

## 2012-05-15 MED ORDER — HYDROCODONE-ACETAMINOPHEN 5-325 MG PO TABS: 1.0000 | ORAL_TABLET | Freq: Three times a day (TID) | ORAL | Status: DC | PRN

## 2012-05-15 NOTE — Assessment & Plan Note (Signed)
Wt Readings from Last 3 Encounters:  05/15/12 290 lb 9.6 oz (131.815 kg)  05/08/12 291 lb (131.997 kg)  02/20/12 292 lb (132.45 kg)   The patient is asked to make an attempt to improve diet and exercise patterns to aid in medical management of this problem.

## 2012-05-15 NOTE — Assessment & Plan Note (Signed)
Diet controlled - importance of diet and weight control reviewed - On ACEI and statin + ASA 81 Check a1c today - Lab Results  Component Value Date   HGBA1C 5.6 11/15/2011

## 2012-05-15 NOTE — Progress Notes (Signed)
Subjective:    Patient ID: Katie Higgins, female    DOB: 24-Apr-1950, 62 y.o.   MRN: [id number]  HPI  here for follow up - reviewed chronic medical issues:  CHF hx - hosp 04/2009 due to same exacerbated by hyperthyroidism s/p cardiac cath for same - ongoing med mgmt without recurrent shortness of breath or swelling -  following with cards - reports 100% compliance with meds -   Hyperthyroidism/Graves' disease - dx during 04/2009 hosp for chf- follows with endo -  On methimazole, unable to stop medication for I-131 ablation due to concern for recurrent CHF. reports compliance with ongoing medical treatment and no changes in medication dose or frequency.  denies adverse side effects related to current therapy.   hypertension - reports compliance with ongoing medical treatment and no changes in medication dose or frequency. denies adverse side effects related to current therapy.   dyslipidemia - on lovastatin. reports compliance with ongoing medical treatment and no changes in medication dose or frequency. denies adverse side effects related to current therapy.   diet DM2 - likely related to hyperthyroid per pt - checks sugars 2-3x/week - AM range 110s - diet control only at this time   Past Medical History  Diagnosis Date  . Palpitations   . Obesity   . CARDIOMYOPATHY     Nonischemic. Admitted in 3/11 with CHF exac, due to hyperthyroidism. LHC (03/11) showed clean coronaries with EF 40%. Myoview showed EF 38%. Echo was a technically difficult study with mild to moderately decreased EF, mild LVH, mild MR. Repeat echo (9/11) with EF 55-60%, mild diastolic dysfunction (grade I).  . Congestive heart failure, unspecified   . Osteoarthritis     esp L hip, low back  . ALLERGIC RHINITIS   . DIABETES MELLITUS, CONTROLLED   . DYSLIPIDEMIA   . HYPERTENSION   . HYPERTHYROIDISM   . CARPAL TUNNEL SYNDROME, LEFT, MILD    Review of Systems  Constitutional: Negative for fever and unexpected  weight change. Fatigue: chronic, unchanged.  Respiratory: Negative for cough and wheezing.   Cardiovascular: Negative for chest pain, palpitations and leg swelling.      Objective:   Physical Exam  BP 114/72  Pulse 78  Temp(Src) 98.7 F (37.1 C) (Oral)  Wt 290 lb 9.6 oz (131.815 kg)  BMI 53.14 kg/m2  SpO2 98% Wt Readings from Last 3 Encounters:  05/15/12 290 lb 9.6 oz (131.815 kg)  05/08/12 291 lb (131.997 kg)  02/20/12 292 lb (132.45 kg)   Constitutional: She is overweight; appears well-developed and well-nourished. No distress. sitting in manual WC (usual)  Eyes: Wears corrective lenses. PERRLA, EOMI. Vision grossly intact. No conjunctivitis or icterus Neck: Mild thyromegaly, nontender. No appreciable nodules. No JVD or LAD. Full range of motion Cardiovascular: Normal rate, regular rhythm and normal heart sounds.  No murmur heard. No BLE edema. Pulmonary/Chest: Effort normal and breath sounds normal. No respiratory distress. She has no wheezes.  Abdomen: Large pannus. Soft, nontender, positive bowel sounds  Psychiatric: She has a normal mood and affect. Her behavior is normal. Judgment and thought content normal.   Lab Results  Component Value Date   WBC 8.4 11/15/2011   HGB 11.1* 11/15/2011   HCT 34.3* 11/15/2011   PLT 273.0 11/15/2011   CHOL 110 05/08/2012   TRIG 61.0 05/08/2012   HDL 38.00* 05/08/2012   ALT 12 11/15/2011   AST 16 11/15/2011   NA 138 05/08/2012   K 4.7 05/08/2012   CL 105 05/08/2012  CREATININE 1.5* 05/08/2012   BUN 37* 05/08/2012   CO2 24 05/08/2012   TSH 2.04 02/20/2012   INR 1.11 04/19/2009   HGBA1C 5.6 11/15/2011       Assessment & Plan:   see problem list. Medications and labs reviewed today.

## 2012-05-15 NOTE — Assessment & Plan Note (Signed)
Chronic, improved with amitriptyline (dose increased 11/2011)

## 2012-05-15 NOTE — Assessment & Plan Note (Signed)
On MMZ, improved mgmt per endo Check labs now including CBC Lab Results  Component Value Date   TSH 2.04 02/20/2012

## 2012-05-15 NOTE — Patient Instructions (Signed)
It was good to see you today. We have reviewed your prior records including labs and tests today Medications reviewed and updated: no changes recommended today Refill on medication(s) as discussed today. Health Maintenance reviewed -all recommended immunizations and age-appropriate screenings are up-to-date or declined. Test(s) ordered today. Your results will be released to MyChart (or called to you) after review, usually within 72hours after test completion. If any changes need to be made, you will be notified at that same time. Please schedule followup in 6 months for diabetes mellitus and weight check, call sooner if problems. Followup with Dr. Everardo All and cardiology specialists as ongoing

## 2012-05-20 ENCOUNTER — Encounter: Payer: Self-pay | Admitting: Internal Medicine

## 2012-05-20 ENCOUNTER — Encounter: Payer: Self-pay | Admitting: Cardiology

## 2012-05-21 ENCOUNTER — Other Ambulatory Visit: Payer: Self-pay | Admitting: *Deleted

## 2012-05-21 MED ORDER — TRAMADOL HCL 50 MG PO TABS: 50.0000 mg | ORAL_TABLET | Freq: Four times a day (QID) | ORAL | Status: DC | PRN

## 2012-05-21 MED ORDER — AMITRIPTYLINE HCL 25 MG PO TABS: 25.0000 mg | ORAL_TABLET | Freq: Every day | ORAL | Status: DC

## 2012-05-21 MED ORDER — HYDROCODONE-ACETAMINOPHEN 5-325 MG PO TABS: 1.0000 | ORAL_TABLET | Freq: Three times a day (TID) | ORAL | Status: AC | PRN

## 2012-05-21 MED ORDER — CYCLOBENZAPRINE HCL 10 MG PO TABS: 10.0000 mg | ORAL_TABLET | Freq: Three times a day (TID) | ORAL | Status: DC | PRN

## 2012-05-21 NOTE — Telephone Encounter (Signed)
MD received email mail service didn't received prescriptions. Requesting all rx's to be resent to mail-by-mail...lmb

## 2012-05-23 ENCOUNTER — Encounter: Payer: Self-pay | Admitting: Internal Medicine

## 2012-05-23 MED ORDER — PROMETHAZINE-DM 6.25-15 MG/5ML PO SYRP: 5.0000 mL | ORAL_SOLUTION | Freq: Four times a day (QID) | ORAL | Status: DC | PRN

## 2012-05-23 MED ORDER — AZITHROMYCIN 250 MG PO TABS: ORAL_TABLET | ORAL | Status: DC

## 2012-07-19 ENCOUNTER — Encounter: Payer: Self-pay | Admitting: Internal Medicine

## 2012-07-19 MED ORDER — HYDROCODONE-ACETAMINOPHEN 5-325 MG PO TABS: 1.0000 | ORAL_TABLET | Freq: Four times a day (QID) | ORAL | Status: DC | PRN

## 2012-07-19 MED ORDER — CYCLOBENZAPRINE HCL 10 MG PO TABS: 10.0000 mg | ORAL_TABLET | Freq: Three times a day (TID) | ORAL | Status: DC | PRN

## 2012-07-25 ENCOUNTER — Other Ambulatory Visit: Payer: Self-pay

## 2012-07-25 ENCOUNTER — Encounter: Payer: Self-pay | Admitting: Internal Medicine

## 2012-07-25 MED ORDER — HYDROCODONE-ACETAMINOPHEN 5-325 MG PO TABS: 1.0000 | ORAL_TABLET | Freq: Four times a day (QID) | ORAL | Status: DC | PRN

## 2012-07-25 MED ORDER — CYCLOBENZAPRINE HCL 10 MG PO TABS: 10.0000 mg | ORAL_TABLET | Freq: Three times a day (TID) | ORAL | Status: DC | PRN

## 2012-07-25 NOTE — Addendum Note (Signed)
Addended by: Lyanne Co R on: 07/25/2012 03:57 PM   Modules accepted: Orders

## 2012-07-25 NOTE — Telephone Encounter (Signed)
Phone call from patient stating her Flexeril and Hydrocodone were sent to [facility] instead of meds by [facility]. Will you sign new scripts so they can be faxed to her mail order pharmacy

## 2012-07-25 NOTE — Telephone Encounter (Signed)
Prescriptions have been faxed to [phone]

## 2012-08-16 ENCOUNTER — Ambulatory Visit: Admitting: Internal Medicine

## 2012-08-20 ENCOUNTER — Encounter: Payer: Self-pay | Admitting: Endocrinology

## 2012-08-20 ENCOUNTER — Ambulatory Visit (INDEPENDENT_AMBULATORY_CARE_PROVIDER_SITE_OTHER): Admitting: Endocrinology

## 2012-08-20 VITALS — BP 102/64 | HR 80 | Temp 98.6°F | Resp 22 | Ht 62.0 in | Wt 294.4 lb

## 2012-08-20 DIAGNOSIS — E0590 Thyrotoxicosis, unspecified without thyrotoxic crisis or storm: Secondary | ICD-10-CM

## 2012-08-20 MED ORDER — TRIAMCINOLONE ACETONIDE 0.1 % EX CREA: TOPICAL_CREAM | Freq: Three times a day (TID) | CUTANEOUS | Status: DC

## 2012-08-20 NOTE — Patient Instructions (Addendum)
Please call dr Felicity Coyer if you decide to see a specialist for the swallowing problem.   i have sent a prescription to your pharmacy, for a skin cream against the rash. blood tests are being requested for you today.  We'll contact you with results. Please come back for a follow-up appointment in 6 months if ever you have fever while taking methimazole, stop it and call us, because of the risk of a rare side-effect. Let's recheck the ultrasound.  you will receive a phone call, about a day and time for an appointment

## 2012-08-20 NOTE — Progress Notes (Signed)
Subjective:    Patient ID: Katie Higgins, female    DOB: 10/26/1950, 62 y.o.   MRN: [id number]  HPI Pt returns for f/u of grave's dz (dx'ed 2010; US showed heterogeneity of texture and one small nodule; she has been unable to d/c tapazole for i-131 rx, due to chf).  she reports few months of slight dysphagia at the anterior chest, in the context of solids only, but no assoc weight loss.   She also reports rash at the left forearm.   Past Medical History  Diagnosis Date  . Palpitations   . Obesity   . CARDIOMYOPATHY     Nonischemic. Admitted in 3/11 with CHF exac, due to hyperthyroidism. LHC (03/11) showed clean coronaries with EF 40%. Myoview showed EF 38%. Echo was a technically difficult study with mild to moderately decreased EF, mild LVH, mild MR. Repeat echo (9/11) with EF 55-60%, mild diastolic dysfunction (grade I).  . Congestive heart failure, unspecified   . Osteoarthritis     esp L hip, low back  . ALLERGIC RHINITIS   . DIABETES MELLITUS, CONTROLLED   . DYSLIPIDEMIA   . HYPERTENSION   . HYPERTHYROIDISM   . CARPAL TUNNEL SYNDROME, LEFT, MILD     Past Surgical History  Procedure Laterality Date  . Cholecystectomy    . Remote hernia repair    . Cardiac catheterization  04/19/09    History   Social History  . Marital Status: Widowed    Spouse Name: N/A    Number of Children: N/A  . Years of Education: N/A   Occupational History  . Not on file.   Social History Main Topics  . Smoking status: Never Smoker   . Smokeless tobacco: Not on file  . Alcohol Use: No  . Drug Use: No  . Sexually Active: Not on file   Other Topics Concern  . Not on file   Social History Narrative   Lives in [place] with her son.   Widowed since 1992.   She is very sedentary, uses a wheelchair when out of the house.   Ambulatory in home with cane.    Current Outpatient Prescriptions on File Prior to Visit  Medication Sig Dispense Refill  . amitriptyline (ELAVIL) 25 MG tablet  Take 1 tablet (25 mg total) by mouth at bedtime.  90 tablet  1  . aspirin 81 MG tablet Take 81 mg by mouth daily.        . carvedilol (COREG) 25 MG tablet Take 1 tablet (25 mg total) by mouth 2 (two) times daily with a meal.  180 tablet  3  . cyclobenzaprine (FLEXERIL) 10 MG tablet Take 1 tablet (10 mg total) by mouth every 8 (eight) hours as needed for muscle spasms.  90 tablet  1  . diphenhydrAMINE (BENADRYL) 25 mg capsule Take 25 mg by mouth every 4 (four) hours as needed. For allergies       . enalapril (VASOTEC) 10 MG tablet Take 1 tablet (10 mg total) by mouth daily.  90 tablet  3  . furosemide (LASIX) 20 MG tablet Take 1 tablet (20 mg total) by mouth daily.  90 tablet  3  . HYDROcodone-acetaminophen (NORCO/VICODIN) 5-325 MG per tablet Take 1 tablet by mouth every 6 (six) hours as needed for pain.  90 tablet  0  . lovastatin (MEVACOR) 10 MG tablet Take 1 tablet (10 mg total) by mouth daily.  90 tablet  3  . methimazole (TAPAZOLE) 10 MG tablet Take 1  tablet (10 mg total) by mouth daily.  90 tablet  3  . Multiple Vitamin (MULTIVITAMIN) tablet Take 1 tablet by mouth daily.        . Naproxen Sodium (ALEVE) 220 MG CAPS Take 1 capsule by mouth 2 (two) times daily as needed.       . traMADol (ULTRAM) 50 MG tablet Take 1 tablet (50 mg total) by mouth every 6 (six) hours as needed for pain.  270 tablet  0   No current facility-administered medications on file prior to visit.    No Known Allergies  Family History  Problem Relation Age of Onset  . Diabetes Mother   . Breast cancer Mother   . Kidney cancer Father     Kidney Cancer  . Stroke Father     CVA  . Cervical cancer Mother   . Supraventricular tachycardia Sister   . Asthma      family history    BP 102/64  Pulse 80  Temp(Src) 98.6 F (37 C) (Oral)  Resp 22  Ht 5\' 2"  (1.575 m)  Wt 294 lb 7 oz (133.556 kg)  BMI 53.84 kg/m2  SpO2 96%  Review of Systems Denies brbpr and fever.      Objective:   Physical Exam VITAL SIGNS:   See vs page GENERAL: no distress.  In wheelchair. NECK: There is no palpable thyroid enlargement.  No thyroid nodule is palpable.  No palpable lymphadenopathy at the anterior neck.   Skin: mild eczematous rash at the medial aspect of the left forearm.     Lab Results  Component Value Date   TSH 3.12 08/20/2012   (i reviewed thyroid US report)    Assessment & Plan:  Very small multinodular goiter; no rx needed now. Rash, new, uncertain etiology Dysphagia, uncertain etiology.  Pt declines GI appt.

## 2012-08-21 ENCOUNTER — Ambulatory Visit
Admission: RE | Admit: 2012-08-21 | Discharge: 2012-08-21 | Disposition: A | Discharge status: Home / Self Care | Source: Ambulatory Visit | Attending: Endocrinology | Admitting: Endocrinology

## 2012-08-21 DIAGNOSIS — E0590 Thyrotoxicosis, unspecified without thyrotoxic crisis or storm: Secondary | ICD-10-CM

## 2012-08-23 ENCOUNTER — Encounter: Payer: Self-pay | Admitting: Endocrinology

## 2012-09-03 ENCOUNTER — Encounter: Payer: Self-pay | Admitting: Internal Medicine

## 2012-09-03 MED ORDER — MELOXICAM 15 MG PO TABS: 15.0000 mg | ORAL_TABLET | Freq: Every day | ORAL | Status: DC

## 2012-09-17 ENCOUNTER — Encounter: Payer: Self-pay | Admitting: Internal Medicine

## 2012-09-19 ENCOUNTER — Encounter: Payer: Self-pay | Admitting: Internal Medicine

## 2012-09-26 ENCOUNTER — Encounter: Payer: Self-pay | Admitting: Internal Medicine

## 2012-09-27 MED ORDER — NAPROXEN 500 MG PO TABS: 500.0000 mg | ORAL_TABLET | Freq: Two times a day (BID) | ORAL | Status: DC

## 2012-09-27 MED ORDER — HYDROCODONE-ACETAMINOPHEN 5-325 MG PO TABS: 1.0000 | ORAL_TABLET | Freq: Three times a day (TID) | ORAL | Status: DC | PRN

## 2012-09-27 NOTE — Telephone Encounter (Signed)
Faxed script to [facility].Marland Kitchenlmb

## 2012-09-27 NOTE — Telephone Encounter (Signed)
A) please have these 2 rx sent to mail order pharamcy B) please forward such messages to me or someone covering for me while i am away- or instruct the patient that they will need to write again after date of my return - otherwise, I do not "see" the messages.  Thanks [employer]

## 2012-10-02 ENCOUNTER — Encounter: Payer: Self-pay | Admitting: Internal Medicine

## 2012-11-07 ENCOUNTER — Encounter: Payer: Self-pay | Admitting: Internal Medicine

## 2012-11-07 ENCOUNTER — Emergency Department (INDEPENDENT_AMBULATORY_CARE_PROVIDER_SITE_OTHER)
Admission: EM | Admit: 2012-11-07 | Discharge: 2012-11-07 | Disposition: A | Discharge status: Home / Self Care | Source: Home / Self Care | Attending: Family Medicine | Admitting: Family Medicine

## 2012-11-07 ENCOUNTER — Encounter (HOSPITAL_COMMUNITY): Payer: Self-pay | Admitting: Emergency Medicine

## 2012-11-07 ENCOUNTER — Emergency Department (HOSPITAL_COMMUNITY)

## 2012-11-07 DIAGNOSIS — M25561 Pain in right knee: Secondary | ICD-10-CM

## 2012-11-07 DIAGNOSIS — M25569 Pain in unspecified knee: Secondary | ICD-10-CM

## 2012-11-07 HISTORY — DX: Thyrotoxicosis with diffuse goiter without thyrotoxic crisis or storm: E05.00

## 2012-11-07 NOTE — ED Provider Notes (Signed)
Katie Higgins is a 62 y.o. female who presents to Urgent Care today for right knee pain without injury present over the last several days. She is extensive lumbar and hip osteoarthritis. She notes pain with knee motion. She denies any radiating pain weakness or numbness. She is taking her continued chronic hydrocodone which helps. The pain is moderate to severe and worse with activity. She feels well otherwise with no nausea vomiting diarrhea fevers or chills.   Past Medical History  Diagnosis Date  . Palpitations   . Obesity   . CARDIOMYOPATHY     Nonischemic. Admitted in 3/11 with CHF exac, due to hyperthyroidism. LHC (03/11) showed clean coronaries with EF 40%. Myoview showed EF 38%. Echo was a technically difficult study with mild to moderately decreased EF, mild LVH, mild MR. Repeat echo (9/11) with EF 55-60%, mild diastolic dysfunction (grade I).  . Congestive heart failure, unspecified   . Osteoarthritis     esp L hip, low back  . ALLERGIC RHINITIS   . DIABETES MELLITUS, CONTROLLED   . DYSLIPIDEMIA   . HYPERTENSION   . HYPERTHYROIDISM   . CARPAL TUNNEL SYNDROME, LEFT, MILD   . Graves disease    History  Substance Use Topics  . Smoking status: Never Smoker   . Smokeless tobacco: Not on file  . Alcohol Use: No   ROS as above Medications reviewed. No current facility-administered medications for this encounter.   Current Outpatient Prescriptions  Medication Sig Dispense Refill  . amitriptyline (ELAVIL) 25 MG tablet Take 1 tablet (25 mg total) by mouth at bedtime.  90 tablet  1  . aspirin 81 MG tablet Take 81 mg by mouth daily.        . carvedilol (COREG) 25 MG tablet Take 1 tablet (25 mg total) by mouth 2 (two) times daily with a meal.  180 tablet  3  . cyclobenzaprine (FLEXERIL) 10 MG tablet Take 1 tablet (10 mg total) by mouth every 8 (eight) hours as needed for muscle spasms.  90 tablet  1  . diphenhydrAMINE (BENADRYL) 25 mg capsule Take 25 mg by mouth every 4 (four)  hours as needed. For allergies       . enalapril (VASOTEC) 10 MG tablet Take 1 tablet (10 mg total) by mouth daily.  90 tablet  3  . furosemide (LASIX) 20 MG tablet Take 1 tablet (20 mg total) by mouth daily.  90 tablet  3  . HYDROcodone-acetaminophen (NORCO/VICODIN) 5-325 MG per tablet Take 1 tablet by mouth every 8 (eight) hours as needed for pain.  90 tablet  0  . lovastatin (MEVACOR) 10 MG tablet Take 1 tablet (10 mg total) by mouth daily.  90 tablet  3  . methimazole (TAPAZOLE) 10 MG tablet Take 1 tablet (10 mg total) by mouth daily.  90 tablet  3  . Multiple Vitamin (MULTIVITAMIN) tablet Take 1 tablet by mouth daily.        . naproxen (NAPROSYN) 500 MG tablet Take 1 tablet (500 mg total) by mouth 2 (two) times daily with a meal.  180 tablet  0  . traMADol (ULTRAM) 50 MG tablet Take 1 tablet (50 mg total) by mouth every 6 (six) hours as needed for pain.  270 tablet  0  . triamcinolone cream (KENALOG) 0.1 % Apply topically 3 (three) times daily. As needed for itching or rash  30 g  0    Exam:  BP 122/81  Pulse 71  Temp(Src) 98.3 F (36.8 C) (  Oral)  Resp 18  SpO2 95% Gen: Well NAD HIPS BILATERALLY:  Decreased motion pain with motion. Tender palpation greater trochanter Right knee: Normal-appearing no effusion. Diffusely tender. Range of motion extension limited by about 30 flexion to 90 Stable ligamentous exam.  Left knee: Decreased motion nontender no effusion Capillary refill and sensation are intact distally  No results found for this or any previous visit (from the past 24 hour(s)). No results found.  Assessment and Plan: 62 y.o. female with right knee pain.  We attempted to obtain x-rays of the right knee however patient was unable to extend her knee fully and had pain in her back with the recumbent x-ray position. Plan continue hydrocodone for pain followup with Dr. [facility] tomorrow or Monday. X-ray facilities at that location are more conducive to  obtaining good films. Doubtful for fractures patient has no injury. Likely DJD Followup as needed Discussed warning signs or symptoms. Please see discharge instructions. Patient expresses understanding.      Rodolph Bong, MD 11/07/12 [phone]

## 2012-11-07 NOTE — ED Notes (Signed)
Right knee pain. Onset yesterday of this new pain in knee.  Patient reports chronic pain issues noted on Tuesday night: right hip and leg pain and treated with naproxen.  Patient reports typically she uses a wheelchair, and able to bear weight to pivot from one location to another.  Today patient reports she is unable to bear weight.  Bending at knee is very painful

## 2012-11-20 ENCOUNTER — Ambulatory Visit (INDEPENDENT_AMBULATORY_CARE_PROVIDER_SITE_OTHER): Admitting: Internal Medicine

## 2012-11-20 ENCOUNTER — Ambulatory Visit (INDEPENDENT_AMBULATORY_CARE_PROVIDER_SITE_OTHER): Admitting: Family Medicine

## 2012-11-20 ENCOUNTER — Encounter: Payer: Self-pay | Admitting: Family Medicine

## 2012-11-20 ENCOUNTER — Encounter: Payer: Self-pay | Admitting: Internal Medicine

## 2012-11-20 VITALS — BP 140/72 | HR 87

## 2012-11-20 VITALS — BP 140/72 | HR 87 | Temp 98.7°F

## 2012-11-20 DIAGNOSIS — M25569 Pain in unspecified knee: Secondary | ICD-10-CM

## 2012-11-20 DIAGNOSIS — M1712 Unilateral primary osteoarthritis, left knee: Secondary | ICD-10-CM | POA: Insufficient documentation

## 2012-11-20 DIAGNOSIS — E0590 Thyrotoxicosis, unspecified without thyrotoxic crisis or storm: Secondary | ICD-10-CM

## 2012-11-20 DIAGNOSIS — Z23 Encounter for immunization: Secondary | ICD-10-CM

## 2012-11-20 DIAGNOSIS — I10 Essential (primary) hypertension: Secondary | ICD-10-CM

## 2012-11-20 DIAGNOSIS — M25561 Pain in right knee: Secondary | ICD-10-CM | POA: Insufficient documentation

## 2012-11-20 DIAGNOSIS — E669 Obesity, unspecified: Secondary | ICD-10-CM

## 2012-11-20 DIAGNOSIS — M79609 Pain in unspecified limb: Secondary | ICD-10-CM

## 2012-11-20 DIAGNOSIS — M79604 Pain in right leg: Secondary | ICD-10-CM

## 2012-11-20 DIAGNOSIS — E119 Type 2 diabetes mellitus without complications: Secondary | ICD-10-CM

## 2012-11-20 MED ORDER — TRAMADOL HCL 50 MG PO TABS: 50.0000 mg | ORAL_TABLET | Freq: Four times a day (QID) | ORAL | Status: DC | PRN

## 2012-11-20 MED ORDER — NAPROXEN 500 MG PO TABS: 500.0000 mg | ORAL_TABLET | Freq: Two times a day (BID) | ORAL | Status: DC

## 2012-11-20 MED ORDER — CYCLOBENZAPRINE HCL 10 MG PO TABS: 10.0000 mg | ORAL_TABLET | Freq: Three times a day (TID) | ORAL | Status: DC | PRN

## 2012-11-20 MED ORDER — AMITRIPTYLINE HCL 25 MG PO TABS: 25.0000 mg | ORAL_TABLET | Freq: Every day | ORAL | Status: DC

## 2012-11-20 MED ORDER — MELOXICAM 15 MG PO TABS: 15.0000 mg | ORAL_TABLET | Freq: Every day | ORAL | Status: DC

## 2012-11-20 MED ORDER — HYDROCODONE-ACETAMINOPHEN 5-325 MG PO TABS: 1.0000 | ORAL_TABLET | Freq: Three times a day (TID) | ORAL | Status: DC | PRN

## 2012-11-20 NOTE — Assessment & Plan Note (Signed)
On MMZ, improved mgmt per endo  Lab Results  Component Value Date   TSH 3.12 08/20/2012

## 2012-11-20 NOTE — Patient Instructions (Addendum)
Stop naproxen Start meloxicam daily for 10 days then as needed Ice 20 minutes 2 times a day Brace with activity.  Exercises daily Come back again in 3-4 weeks.   Take tylenol 650 mg three times a day is the best evidence based medicine we have for arthritis.  Vitamin D 1000Iu daily Fish oil 3 grams daily.  Glucosamine sulfate 750mg  twice a day is a supplement that has been shown to help moderate to severe arthritis. Capsaicin topically up to four times a day may also help with pain. Cortisone injections are an option if these interventions do not seem to make a difference or need more relief.  If cortisone injections do not help, there are different types of shots that may help but they take longer to take effect.  We can discuss this at follow up.  It's important that you continue to stay active. Controlling your weight is important.  Consider physical therapy to strengthen muscles around the joint that hurts to take pressure off of the joint itself. Shoe inserts with good arch support may be helpful. Walker or cane if needed. Heat or ice 20 minutes at a time 3-4 times a day as needed to help with pain. Water aerobics and cycling with low resistance are the best two types of exercise for arthritis. Come back and see me in 3-4 weeks.

## 2012-11-20 NOTE — Assessment & Plan Note (Signed)
Wt Readings from Last 3 Encounters:  08/20/12 294 lb 7 oz (133.556 kg)  05/15/12 290 lb 9.6 oz (131.815 kg)  05/08/12 291 lb (131.997 kg)   The patient is asked to make an attempt to improve diet and exercise patterns to aid in medical management of this problem.

## 2012-11-20 NOTE — Progress Notes (Signed)
Subjective:    Patient ID: Katie Higgins, female    DOB: [birth year], 62 y.o.   MRN: [id number]  Knee Pain  The incident occurred more than 1 week ago. Incident location: shopping at [facility]. There was no injury mechanism. The pain is present in the right knee. The quality of the pain is described as aching, shooting and cramping. The pain is moderate. The pain has been fluctuating since onset. Associated symptoms include an inability to bear weight and a loss of motion. Pertinent negatives include no loss of sensation, muscle weakness or numbness. She reports no foreign bodies present. The symptoms are aggravated by movement, palpation and weight bearing. She has tried elevation, NSAIDs, non-weight bearing and rest for the symptoms. The treatment provided mild relief.   Also reviewed chronic medical issues and interval medical events  Past Medical History  Diagnosis Date  . Palpitations   . Obesity   . CARDIOMYOPATHY     Nonischemic. Admitted in 3/11 with CHF exac, due to hyperthyroidism. LHC (03/11) showed clean coronaries with EF 40%. Myoview showed EF 38%. Echo was a technically difficult study with mild to moderately decreased EF, mild LVH, mild MR. Repeat echo (9/11) with EF 55-60%, mild diastolic dysfunction (grade I).  . Congestive heart failure, unspecified   . Osteoarthritis     esp L hip, low back  . ALLERGIC RHINITIS   . DIABETES MELLITUS, CONTROLLED   . DYSLIPIDEMIA   . HYPERTENSION   . HYPERTHYROIDISM   . CARPAL TUNNEL SYNDROME, LEFT, MILD   . Graves disease    Review of Systems  Constitutional: Negative for fever and unexpected weight change. Fatigue: chronic, unchanged.  Respiratory: Negative for cough and wheezing.   Cardiovascular: Negative for chest pain, palpitations and leg swelling.  Neurological: Negative for numbness.      Objective:   Physical Exam BP 140/72  Pulse 87  Temp(Src) 98.7 F (37.1 C) (Oral)  SpO2 97% Wt Readings from Last 3 Encounters:   08/20/12 294 lb 7 oz (133.556 kg)  05/15/12 290 lb 9.6 oz (131.815 kg)  05/08/12 291 lb (131.997 kg)   Constitutional: She is overweight; appears well-developed and well-nourished. No distress. sitting in manual WC (usual) - Dtr at side Eyes: Wears corrective lenses. PERRLA, EOMI. Vision grossly intact. No conjunctivitis or icterus Neck: Mild thyromegaly, nontender. No appreciable nodules. No JVD or LAD. Full range of motion Cardiovascular: Normal rate, regular rhythm and normal heart sounds.  No murmur heard. No BLE edema. Pulmonary/Chest: Effort normal and breath sounds normal. No respiratory distress. She has no wheezes.  Musculoskeletal: tight soft tissue swelling of right lower extremity distal to knee -no evidence for knee effusion. Tender to palpation laterally greater than medially - FROM with ligamentous function grossly intact Abdomen: Large pannus. Soft, nontender, positive bowel sounds  Psychiatric: She has a normal mood and affect. Her behavior is normal. Judgment and thought content normal.   Lab Results  Component Value Date   WBC 8.9 05/15/2012   HGB 11.1* 05/15/2012   HCT 33.8* 05/15/2012   PLT 311.0 05/15/2012   CHOL 110 05/08/2012   TRIG 61.0 05/08/2012   HDL 38.00* 05/08/2012   ALT 12 11/15/2011   AST 16 11/15/2011   NA 138 05/08/2012   K 4.7 05/08/2012   CL 105 05/08/2012   CREATININE 1.5* 05/08/2012   BUN 37* 05/08/2012   CO2 24 05/08/2012   TSH 3.12 08/20/2012   INR 1.11 04/19/2009   HGBA1C 5.7 05/15/2012  Assessment & Plan:   Right knee pain, ongoing x2 weeks ER evaluation October 2 and sports medicine evaluation October 6 Reports negative x-ray and generally unimproved following steroid injection October 6 I Dr. Farris Has  Given diffuse soft tissue swelling distal to knee over anterior side of proximal lower leg, concern for muscular tear or other soft tissue injury  Reviewed with sports medicine specialist Dr. Renella Cunas in office today who will kindly see in separate consult  this afternoon  Continue anti-inflammatories with Norco as needed breakthrough pain, refill provided   Also see prblem list. Medications and labs reviewed today.

## 2012-11-20 NOTE — Patient Instructions (Signed)
It was good to see you today.  Are reviewed your interval medical history  We will have Dr. Katrinka Blazing evaluated her leg pain and swelling today as discussed  Medications reviewed, refills provided as requested  Followup in 3 months for diabetes, call sooner if problems

## 2012-11-20 NOTE — Assessment & Plan Note (Signed)
BP Readings from Last 3 Encounters:  11/20/12 140/72  11/20/12 140/72  11/07/12 122/81   The current medical regimen is effective;  continue present plan and medications.

## 2012-11-20 NOTE — Progress Notes (Signed)
Pre-visit discussion using our clinic review tool. No additional management support is needed unless otherwise documented below in the visit note.  

## 2012-11-20 NOTE — Assessment & Plan Note (Signed)
Discussed over-the-counter medications Discussed using a brace which was fitted by me today. Discussed icing protocol. Discussed the importance of weight loss. Patient to follow up in 3-4 weeks. We can repeat injections every 3-4 months.

## 2012-11-20 NOTE — Assessment & Plan Note (Signed)
Diet controlled - importance of diet and weight control reviewed - On ACEI and statin + ASA 81 Check a1c q56mo - Lab Results  Component Value Date   HGBA1C 5.7 05/15/2012

## 2012-11-20 NOTE — Assessment & Plan Note (Signed)
Patient given home exercise program Patient was also given a brace was fitted by me today. Discussed icing protocol. I do think patient's knee pain is likely multifactorial with her morbid obesity as well as her severe osteophytic of her knee. Patient discussed different treatment options. She will return in 3-4 weeks.

## 2012-11-20 NOTE — Progress Notes (Signed)
I'm seeing this patient by the request  of:  Rene Paci, MD   CC: Right knee pain  HPI: Patient is a 62 year old female who had right knee pain 2 weeks after being at [facility]. Patient was in a cart and had her leg extended for a long amount of time. Patient was fine that day but started the next day she has significant pain on the lateral aspect of the knee. This did seem to radiate somewhat up the leg as well as down the calf. This did not get to her ankle. Patient states that it hurts to palpation and she started to have some mild swelling. Patient denies any fevers or chills or any erythema the skin. Patient had seen a physician who did do a knee injection that helped with some pain but still not on the lateral aspect. Patient describes the pain as a sharp pain. Patient stated all he feels good when she stays off of it and is worse when she is angulating or to palpation. Patient is a severity of 8/10.   Past medical, surgical, family and social history reviewed. Medications reviewed all in the electronic medical record.   Review of Systems: No headache, visual changes, nausea, vomiting, diarrhea, constipation, dizziness, abdominal pain, skin rash, fevers, chills, night sweats, weight loss, swollen lymph nodes, body aches, joint swelling, muscle aches, chest pain, shortness of breath, mood changes.   Objective:    Blood pressure 140/72, pulse 87, SpO2 97.00%.   General: No apparent distress alert and oriented x3 mood and affect normal, dressed appropriately. Morbidly obese and in a wheelchair HEENT: Pupils equal, extraocular movements intact Respiratory: Patient's speak in full sentences and does not appear short of breath Cardiovascular: No lower extremity edema, non tender, no erythema Skin: Warm dry intact with no signs of infection or rash on extremities or on axial skeleton. Abdomen: Soft nontender Neuro: Cranial nerves II through XII are intact, neurovascularly intact in all  extremities with 2+ DTRs and 2+ pulses. Lymph: No lymphadenopathy of posterior or anterior cervical chain or axillae bilaterally.  Gait normal with good balance and coordination.  MSK: Non tender with full range of motion and good stability and symmetric strength and tone of shoulders, elbows, wrist, and ankles bilaterally.  Knee: Right On inspection patient does have osteo arthritic changes  Palpation patient does have tenderness over the lateral joint line and mild of the medial joint line.[name] ROM is decreased in flexion to 95. Ligaments with solid consistent endpoints including ACL, PCL, LCL, MCL. Negative Mcmurray's, Apley's, and Thessalonian tests. Non painful patellar compression. Patellar glide without crepitus. Patellar and quadriceps tendons unremarkable. Hamstring and quadriceps strength is normal.  Contralateral side workable for osteoporotic changes and tenderness on the medial and lateral joint line but full range of motion.  MSK US performed of: Right knee This study was ordered, performed, and interpreted by Terrilee Files D.O.  Knee: Right All structures visualized. Lateral medial joint lines due to his excessive osteoarthritic changes. On the lateral joint line the popliteus muscle does have significant hypoechoic changes with mild subluxation. Patient does have some mild manipulation and this did push the popliteus muscle into its groove. Patellar Tendon unremarkable on long and transverse views without effusion. No abnormality of prepatellar bursa. LCL and MCL unremarkable on long and transverse views. No abnormality of origin of medial or lateral head of the gastrocnemius.  IMPRESSION:  Popliteus subluxation with severe osteophytic changes of the knee   Impression and Recommendations:  This case required medical decision making of moderate complexity.

## 2012-12-03 ENCOUNTER — Other Ambulatory Visit: Payer: Self-pay | Admitting: *Deleted

## 2012-12-03 NOTE — Telephone Encounter (Signed)
A user error has taken place: open by mistake.../lmb  

## 2012-12-04 ENCOUNTER — Encounter: Payer: Self-pay | Admitting: Family Medicine

## 2012-12-04 MED ORDER — MELOXICAM 15 MG PO TABS: 15.0000 mg | ORAL_TABLET | Freq: Every day | ORAL | Status: DC

## 2012-12-04 NOTE — Telephone Encounter (Signed)
Done

## 2012-12-17 ENCOUNTER — Ambulatory Visit (INDEPENDENT_AMBULATORY_CARE_PROVIDER_SITE_OTHER): Admitting: Family Medicine

## 2012-12-17 ENCOUNTER — Encounter: Payer: Self-pay | Admitting: Family Medicine

## 2012-12-17 VITALS — BP 102/64 | HR 85

## 2012-12-17 DIAGNOSIS — IMO0002 Reserved for concepts with insufficient information to code with codable children: Secondary | ICD-10-CM

## 2012-12-17 DIAGNOSIS — M25561 Pain in right knee: Secondary | ICD-10-CM

## 2012-12-17 DIAGNOSIS — M1711 Unilateral primary osteoarthritis, right knee: Secondary | ICD-10-CM | POA: Insufficient documentation

## 2012-12-17 DIAGNOSIS — M25569 Pain in unspecified knee: Secondary | ICD-10-CM

## 2012-12-17 DIAGNOSIS — M1710 Unilateral primary osteoarthritis, unspecified knee: Secondary | ICD-10-CM

## 2012-12-17 NOTE — Patient Instructions (Signed)
Increase tramadol to 100mg  at each dose.  Continue brace Go to physical therapy they will call you.  Come back in 1 month if still in pain we can start Synvisc. I will need to give you a prescription though.

## 2012-12-17 NOTE — Assessment & Plan Note (Signed)
Patient does have severe osteoarthritis of the right knee. Patient will continue them meloxicam, Norco that she's gotten from another provider, tramadol. Discuss again encourage patient to potentially attempt to lose weight. Patient will go to formal physical therapy. Injection given as stated above. Patient's will come back in one month. If she continues to have pain we'll viscous supplementation.

## 2012-12-17 NOTE — Progress Notes (Signed)
  CC: Right knee pain followup  HPI: Patient is a 62 year old female who had right knee pain Patient was diagnosed with severe osteoarthritis of the right knee as well as popliteus subluxation. Patient states that she has not made any significant improvement. Patient continues to take tramadol as well as use the Kenalog cream that she was given by Dr. Farris Has. Patient states he still has significant pain with angulation. Patient has not been able to do other activity such as even ambulation secondary to the pain. Patient was the pain 10 out of 10. Patient denies any new symptoms.  Past medical, surgical, family and social history reviewed. Medications reviewed all in the electronic medical record.   Review of Systems: No headache, visual changes, nausea, vomiting, diarrhea, constipation, dizziness, abdominal pain, skin rash, fevers, chills, night sweats, weight loss, swollen lymph nodes, body aches, joint swelling, muscle aches, chest pain, shortness of breath, mood changes.   Objective:    Blood pressure 102/64, pulse 85, SpO2 95.00%.   General: No apparent distress alert and oriented x3 mood and affect normal, dressed appropriately. Morbidly obese and in a wheelchair HEENT: Pupils equal, extraocular movements intact Respiratory: Patient's speak in full sentences and does not appear short of breath Cardiovascular: No lower extremity edema, non tender, no erythema Skin: Warm dry intact with no signs of infection or rash on extremities or on axial skeleton. Abdomen: Soft nontender Neuro: Cranial nerves II through XII are intact, neurovascularly intact in all extremities with 2+ DTRs and 2+ pulses. Lymph: No lymphadenopathy of posterior or anterior cervical chain or axillae bilaterally.  Gait normal with good balance and coordination.  MSK: Non tender with full range of motion and good stability and symmetric strength and tone of shoulders, elbows, wrist, and ankles bilaterally.  Knee: Right On  inspection patient does have osteo arthritic changes  Palpation patient does have tenderness over the lateral joint line and mild of the medial joint line.[name] ROM is decreased in flexion to 95. Ligaments with solid consistent endpoints including ACL, PCL, LCL, MCL. Negative Mcmurray's, Apley's, and Thessalonian tests. Positive painful patellar compression. Patellar glide without crepitus. Patellar and quadriceps tendons unremarkable. Hamstring and quadriceps strength is normal.  Contralateral side is positive for osteoporotic changes and tenderness on the medial and lateral joint line but full range of motion.  Procedure note.  After informed written and verbal consent, patient was seated on exam table. Right knee was prepped with alcohol swab and utilizing anterolateral approach, patient's right knee space was injected with 4:1  marcaine 0.5%: Kenalog 40mg /dL. Patient tolerated the procedure well without immediate complications.  Impression and Recommendations:     This case required medical decision making of moderate complexity.

## 2012-12-26 ENCOUNTER — Ambulatory Visit

## 2012-12-29 ENCOUNTER — Encounter: Payer: Self-pay | Admitting: Family Medicine

## 2012-12-30 ENCOUNTER — Encounter: Payer: Self-pay | Admitting: Family Medicine

## 2012-12-30 MED ORDER — HYLAN G-F 20 16 MG/2ML IX SOSY: 1.0000 | PREFILLED_SYRINGE | INTRA_ARTICULAR | Status: DC

## 2013-01-15 ENCOUNTER — Ambulatory Visit (INDEPENDENT_AMBULATORY_CARE_PROVIDER_SITE_OTHER): Admitting: Family Medicine

## 2013-01-15 ENCOUNTER — Encounter: Payer: Self-pay | Admitting: Family Medicine

## 2013-01-15 VITALS — BP 144/88 | HR 91

## 2013-01-15 DIAGNOSIS — M1711 Unilateral primary osteoarthritis, right knee: Secondary | ICD-10-CM

## 2013-01-15 DIAGNOSIS — M1710 Unilateral primary osteoarthritis, unspecified knee: Secondary | ICD-10-CM

## 2013-01-15 NOTE — Assessment & Plan Note (Signed)
Patient did have injection as described above Will come back in one week for Synvisc #2 Encourage her to continue the exercise

## 2013-01-15 NOTE — Progress Notes (Signed)
Pre-visit discussion using our clinic review tool. No additional management support is needed unless otherwise documented below in the visit note.  

## 2013-01-15 NOTE — Progress Notes (Signed)
  CC: Right knee pain followup Here for synvisc #1  HPI: Patient is a 62 year old female who had right knee pain Patient was diagnosed with severe osteoarthritis of the right knee as well as popliteus subluxation. Patient states that with the exercises and has improved by about 40%. Patient would like to go continue with the Synvisc injections. Patient denies any new symptoms. States that it is still seems to be worse with cold temperatures.  Past medical, surgical, family and social history reviewed. Medications reviewed all in the electronic medical record.   Review of Systems: No headache, visual changes, nausea, vomiting, diarrhea, constipation, dizziness, abdominal pain, skin rash, fevers, chills, night sweats, weight loss, swollen lymph nodes, body aches, joint swelling, muscle aches, chest pain, shortness of breath, mood changes.   Objective:    Blood pressure 144/88, pulse 91, SpO2 96.00%.   General: No apparent distress alert and oriented x3 mood and affect normal, dressed appropriately. Morbidly obese and in a wheelchair HEENT: Pupils equal, extraocular movements intact Respiratory: Patient's speak in full sentences and does not appear short of breath Cardiovascular: No lower extremity edema, non tender, no erythema Skin: Warm dry intact with no signs of infection or rash on extremities or on axial skeleton. Abdomen: Soft nontender Neuro: Cranial nerves II through XII are intact, neurovascularly intact in all extremities with 2+ DTRs and 2+ pulses. Lymph: No lymphadenopathy of posterior or anterior cervical chain or axillae bilaterally.  Gait normal with good balance and coordination.  MSK: Non tender with full range of motion and good stability and symmetric strength and tone of shoulders, elbows, wrist, and ankles bilaterally.  Knee: Right On inspection patient does have osteo arthritic changes  Palpation patient does have tenderness over the lateral joint line and mild of the  medial joint line.[name] ROM is decreased in flexion to 95. Ligaments with solid consistent endpoints including ACL, PCL, LCL, MCL. Negative Mcmurray's, Apley's, and Thessalonian tests. Positive painful patellar compression. Patellar glide without crepitus. Patellar and quadriceps tendons unremarkable. Hamstring and quadriceps strength is normal.  Contralateral side is positive for osteoporotic changes and tenderness on the medial and lateral joint line but full range of motion.  Procedure note.  16 mg/2.5 mL of Synvisc (sodium hyaluronate) in a prefilled syringe was injected easily into the knee through a 22-gauge needle.  Impression and Recommendations:     This case required medical decision making of moderate complexity.

## 2013-01-15 NOTE — Patient Instructions (Signed)
It is good to see you Come back next week and we will do another injection Exercises 3 times a week.  We will check your urine.

## 2013-01-17 ENCOUNTER — Other Ambulatory Visit

## 2013-01-20 ENCOUNTER — Encounter (HOSPITAL_COMMUNITY): Payer: Self-pay | Admitting: Emergency Medicine

## 2013-01-20 ENCOUNTER — Emergency Department (HOSPITAL_COMMUNITY)
Admission: EM | Admit: 2013-01-20 | Discharge: 2013-01-21 | Disposition: A | Discharge status: Home / Self Care | Attending: Emergency Medicine | Admitting: Emergency Medicine

## 2013-01-20 ENCOUNTER — Emergency Department (INDEPENDENT_AMBULATORY_CARE_PROVIDER_SITE_OTHER)
Admission: EM | Admit: 2013-01-20 | Discharge: 2013-01-20 | Disposition: A | Discharge status: Other Acute Inpatient Hospital | Source: Home / Self Care | Attending: Family Medicine | Admitting: Family Medicine

## 2013-01-20 ENCOUNTER — Emergency Department (HOSPITAL_COMMUNITY)

## 2013-01-20 DIAGNOSIS — A084 Viral intestinal infection, unspecified: Secondary | ICD-10-CM

## 2013-01-20 DIAGNOSIS — R059 Cough, unspecified: Secondary | ICD-10-CM | POA: Insufficient documentation

## 2013-01-20 DIAGNOSIS — J3489 Other specified disorders of nose and nasal sinuses: Secondary | ICD-10-CM | POA: Insufficient documentation

## 2013-01-20 DIAGNOSIS — Z7982 Long term (current) use of aspirin: Secondary | ICD-10-CM | POA: Insufficient documentation

## 2013-01-20 DIAGNOSIS — I10 Essential (primary) hypertension: Secondary | ICD-10-CM | POA: Insufficient documentation

## 2013-01-20 DIAGNOSIS — I428 Other cardiomyopathies: Secondary | ICD-10-CM | POA: Insufficient documentation

## 2013-01-20 DIAGNOSIS — J309 Allergic rhinitis, unspecified: Secondary | ICD-10-CM | POA: Insufficient documentation

## 2013-01-20 DIAGNOSIS — R142 Eructation: Secondary | ICD-10-CM | POA: Insufficient documentation

## 2013-01-20 DIAGNOSIS — R141 Gas pain: Secondary | ICD-10-CM | POA: Insufficient documentation

## 2013-01-20 DIAGNOSIS — E785 Hyperlipidemia, unspecified: Secondary | ICD-10-CM | POA: Insufficient documentation

## 2013-01-20 DIAGNOSIS — K5289 Other specified noninfective gastroenteritis and colitis: Secondary | ICD-10-CM | POA: Insufficient documentation

## 2013-01-20 DIAGNOSIS — E0500 Thyrotoxicosis with diffuse goiter without thyrotoxic crisis or storm: Secondary | ICD-10-CM | POA: Insufficient documentation

## 2013-01-20 DIAGNOSIS — R1084 Generalized abdominal pain: Secondary | ICD-10-CM | POA: Insufficient documentation

## 2013-01-20 DIAGNOSIS — R05 Cough: Secondary | ICD-10-CM | POA: Insufficient documentation

## 2013-01-20 DIAGNOSIS — E669 Obesity, unspecified: Secondary | ICD-10-CM | POA: Insufficient documentation

## 2013-01-20 DIAGNOSIS — M1990 Unspecified osteoarthritis, unspecified site: Secondary | ICD-10-CM | POA: Insufficient documentation

## 2013-01-20 DIAGNOSIS — Z79899 Other long term (current) drug therapy: Secondary | ICD-10-CM | POA: Insufficient documentation

## 2013-01-20 DIAGNOSIS — R072 Precordial pain: Secondary | ICD-10-CM | POA: Insufficient documentation

## 2013-01-20 DIAGNOSIS — E0590 Thyrotoxicosis, unspecified without thyrotoxic crisis or storm: Secondary | ICD-10-CM | POA: Insufficient documentation

## 2013-01-20 DIAGNOSIS — R000 Tachycardia, unspecified: Secondary | ICD-10-CM | POA: Insufficient documentation

## 2013-01-20 DIAGNOSIS — K529 Noninfective gastroenteritis and colitis, unspecified: Secondary | ICD-10-CM

## 2013-01-20 DIAGNOSIS — I509 Heart failure, unspecified: Secondary | ICD-10-CM

## 2013-01-20 DIAGNOSIS — E119 Type 2 diabetes mellitus without complications: Secondary | ICD-10-CM | POA: Insufficient documentation

## 2013-01-20 LAB — CBC WITH DIFFERENTIAL/PLATELET
Basophils Absolute: 0 10*3/uL (ref 0.0–0.1)
Eosinophils Absolute: 0.2 10*3/uL (ref 0.0–0.7)
Lymphocytes Relative: 11 % — ABNORMAL LOW (ref 12–46)
Lymphs Abs: 0.8 10*3/uL (ref 0.7–4.0)
MCH: 27.3 pg (ref 26.0–34.0)
Neutrophils Relative %: 76 % (ref 43–77)
Platelets: 288 10*3/uL (ref 150–400)
RBC: 4.73 MIL/uL (ref 3.87–5.11)
WBC: 7.1 10*3/uL (ref 4.0–10.5)

## 2013-01-20 LAB — URINE MICROSCOPIC-ADD ON

## 2013-01-20 LAB — COMPREHENSIVE METABOLIC PANEL
ALT: 17 U/L (ref 0–35)
AST: 21 U/L (ref 0–37)
Albumin: 3.4 g/dL — ABNORMAL LOW (ref 3.5–5.2)
Alkaline Phosphatase: 65 U/L (ref 39–117)
CO2: 21 mEq/L (ref 19–32)
Chloride: 101 mEq/L (ref 96–112)
GFR calc Af Amer: 41 mL/min — ABNORMAL LOW (ref >90)
Glucose, Bld: 118 mg/dL — ABNORMAL HIGH (ref 70–99)
Potassium: 4.2 mEq/L (ref 3.5–5.1)
Sodium: 137 mEq/L (ref 135–145)
Total Protein: 7.5 g/dL (ref 6.0–8.3)

## 2013-01-20 LAB — URINALYSIS, ROUTINE W REFLEX MICROSCOPIC
Glucose, UA: NEGATIVE mg/dL
Hgb urine dipstick: NEGATIVE
Ketones, ur: 15 mg/dL — AB
Specific Gravity, Urine: 1.034 — ABNORMAL HIGH (ref 1.005–1.030)
Urobilinogen, UA: 0.2 mg/dL (ref 0.0–1.0)
pH: 5 (ref 5.0–8.0)

## 2013-01-20 LAB — POCT I-STAT TROPONIN I: Troponin i, poc: 0 ng/mL (ref 0.00–0.08)

## 2013-01-20 LAB — PRO B NATRIURETIC PEPTIDE: Pro B Natriuretic peptide (BNP): 282.8 pg/mL — ABNORMAL HIGH (ref 0–125)

## 2013-01-20 MED ORDER — PANTOPRAZOLE SODIUM 40 MG IV SOLR
40.0000 mg | Freq: Once | INTRAVENOUS | Status: AC
  Administered 2013-01-20: 40 mg via INTRAVENOUS
  Filled 2013-01-20: qty 40

## 2013-01-20 MED ORDER — ONDANSETRON HCL 4 MG/2ML IJ SOLN
4.0000 mg | Freq: Once | INTRAMUSCULAR | Status: AC
  Administered 2013-01-20: 4 mg via INTRAVENOUS
  Filled 2013-01-20: qty 2

## 2013-01-20 MED ORDER — GI COCKTAIL ~~LOC~~
30.0000 mL | Freq: Once | ORAL | Status: AC
  Administered 2013-01-20: 30 mL via ORAL
  Filled 2013-01-20: qty 30

## 2013-01-20 MED ORDER — SODIUM CHLORIDE 0.9 % IV SOLN
Freq: Once | INTRAVENOUS | Status: AC
  Administered 2013-01-20: 17:00:00 via INTRAVENOUS

## 2013-01-20 MED ORDER — MORPHINE SULFATE 4 MG/ML IJ SOLN
4.0000 mg | Freq: Once | INTRAMUSCULAR | Status: AC
  Administered 2013-01-20: 4 mg via INTRAVENOUS
  Filled 2013-01-20: qty 1

## 2013-01-20 MED ORDER — ONDANSETRON HCL 4 MG/2ML IJ SOLN
4.0000 mg | Freq: Once | INTRAMUSCULAR | Status: AC
  Administered 2013-01-20: 4 mg via INTRAVENOUS

## 2013-01-20 MED ORDER — ONDANSETRON 4 MG PO TBDP: 4.0000 mg | ORAL_TABLET | Freq: Once | ORAL | Status: DC

## 2013-01-20 MED ORDER — ONDANSETRON HCL 4 MG/2ML IJ SOLN
INTRAMUSCULAR | Status: AC
  Filled 2013-01-20: qty 2

## 2013-01-20 NOTE — ED Notes (Signed)
Pt arrived by [facility] from [facility]. Pt stated that she started having diarrhea yesterday and this morning started vomiting. Also c/o SOB. Pt has been around family member who was just diagnosed with bronchitis.

## 2013-01-20 NOTE — ED Notes (Signed)
Reportedly has been sick on her stomach, having abdominal cramping

## 2013-01-20 NOTE — ED Notes (Signed)
CT informed of patient having finished oral contrast.

## 2013-01-20 NOTE — ED Provider Notes (Addendum)
CSN: [id number]     Arrival date & time 01/20/13  1623 History   None    No chief complaint on file.  (Consider location/radiation/quality/duration/timing/severity/associated sxs/prior Treatment) Patient is a 62 y.o. female presenting with vomiting.  Emesis Severity:  Moderate Duration:  2 days Timing:  Intermittent Quality:  Stomach contents Progression:  Unchanged Chronicity:  New Worsened by:  Nothing tried Ineffective treatments:  None tried Associated symptoms: abdominal pain and diarrhea     Past Medical History  Diagnosis Date  . Palpitations   . Obesity   . CARDIOMYOPATHY     Nonischemic. Admitted in 3/11 with CHF exac, due to hyperthyroidism. LHC (03/11) showed clean coronaries with EF 40%. Myoview showed EF 38%. Echo was a technically difficult study with mild to moderately decreased EF, mild LVH, mild MR. Repeat echo (9/11) with EF 55-60%, mild diastolic dysfunction (grade I).  . Congestive heart failure, unspecified   . Osteoarthritis     esp L hip, low back  . ALLERGIC RHINITIS   . DIABETES MELLITUS, CONTROLLED   . DYSLIPIDEMIA   . HYPERTENSION   . HYPERTHYROIDISM   . CARPAL TUNNEL SYNDROME, LEFT, MILD   . Graves disease    Past Surgical History  Procedure Laterality Date  . Cholecystectomy    . Remote hernia repair    . Cardiac catheterization  04/19/09   Family History  Problem Relation Age of Onset  . Diabetes Mother   . Breast cancer Mother   . Kidney cancer Father     Kidney Cancer  . Stroke Father     CVA  . Cervical cancer Mother   . Supraventricular tachycardia Sister   . Asthma      family history   History  Substance Use Topics  . Smoking status: Never Smoker   . Smokeless tobacco: Not on file  . Alcohol Use: No   OB History   Grav Para Term Preterm Abortions TAB SAB Ect Mult Living                 Review of Systems  Constitutional: Positive for fever. Negative for appetite change.  HENT: Negative.   Gastrointestinal:  Positive for nausea, vomiting, abdominal pain and diarrhea. Negative for abdominal distention.    Allergies  Review of patient's allergies indicates no known allergies.  Home Medications   Current Outpatient Rx  Name  Route  Sig  Dispense  Refill  . amitriptyline (ELAVIL) 25 MG tablet   Oral   Take 1 tablet (25 mg total) by mouth at bedtime.   90 tablet   1   . aspirin 81 MG tablet   Oral   Take 81 mg by mouth daily.           . carvedilol (COREG) 25 MG tablet   Oral   Take 1 tablet (25 mg total) by mouth 2 (two) times daily with a meal.   180 tablet   3   . cyclobenzaprine (FLEXERIL) 10 MG tablet   Oral   Take 1 tablet (10 mg total) by mouth every 8 (eight) hours as needed for muscle spasms.   90 tablet   1   . diphenhydrAMINE (BENADRYL) 25 mg capsule   Oral   Take 25 mg by mouth every 4 (four) hours as needed. For allergies          . enalapril (VASOTEC) 10 MG tablet   Oral   Take 1 tablet (10 mg total) by mouth daily.  90 tablet   3   . furosemide (LASIX) 20 MG tablet   Oral   Take 1 tablet (20 mg total) by mouth daily.   90 tablet   3   . HYDROcodone-acetaminophen (NORCO/VICODIN) 5-325 MG per tablet   Oral   Take 1 tablet by mouth every 8 (eight) hours as needed for pain.   90 tablet   0   . Hylan (SYNVISC) 16 MG/2ML SOSY   Intra-articular   Inject 1 Syringe into the articular space once a week.   3 Syringe   0   . lovastatin (MEVACOR) 10 MG tablet   Oral   Take 1 tablet (10 mg total) by mouth daily.   90 tablet   3   . meloxicam (MOBIC) 15 MG tablet   Oral   Take 1 tablet (15 mg total) by mouth daily.   30 tablet   0   . meloxicam (MOBIC) 15 MG tablet   Oral   Take 1 tablet (15 mg total) by mouth daily.   30 tablet   2   . methimazole (TAPAZOLE) 10 MG tablet   Oral   Take 1 tablet (10 mg total) by mouth daily.   90 tablet   3   . Multiple Vitamin (MULTIVITAMIN) tablet   Oral   Take 1 tablet by mouth daily.           .  naproxen (NAPROSYN) 500 MG tablet   Oral   Take 1 tablet (500 mg total) by mouth 2 (two) times daily with a meal.   180 tablet   1   . traMADol (ULTRAM) 50 MG tablet   Oral   Take 1 tablet (50 mg total) by mouth every 6 (six) hours as needed for pain.   270 tablet   0   . triamcinolone cream (KENALOG) 0.1 %   Topical   Apply topically 3 (three) times daily. As needed for itching or rash   30 g   0    There were no vitals taken for this visit. Physical Exam  Nursing note and vitals reviewed. Constitutional: She is oriented to person, place, and time. She appears well-developed and well-nourished. She appears distressed.  HENT:  Mouth/Throat: Mucous membranes are dry.  Neck: Normal range of motion. Neck supple.  Cardiovascular: Regular rhythm.   Pulmonary/Chest: Breath sounds normal.  Abdominal: She exhibits no distension and no mass. There is tenderness. There is no rebound and no guarding.  Neurological: She is alert and oriented to person, place, and time.  Skin: Skin is warm and dry.    ED Course  Procedures (including critical care time) Labs Review Labs Reviewed - No data to display Imaging Review No results found.  EKG Interpretation    Date/Time:    Ventricular Rate:    PR Interval:    QRS Duration:   QT Interval:    QTC Calculation:   R Axis:     Text Interpretation:              MDM  Sent for eval and rx of abd pain and protracted n/v/d.    Linna Hoff, MD 01/20/13 1710  Linna Hoff, MD 01/20/13 [phone]

## 2013-01-20 NOTE — ED Provider Notes (Signed)
Pt received from [place], [place].  Pt presented to ED w/ N/V/D and lower abdominal pain, as well as SOB since last night.  Abd pain has resolved; NBS, soft/non-distended, diffuse ttp on repeat exam.  CT pending.  CXR non-acute but shows vascular congestion. and labs unremarkable today.  Pt denies SOB at this time, but she did develop substernal chest pressure ~1hr ago.  Associated w/ belching and retching. Pain improved with sitting up and even further with GI cocktail.  EKG non-ischemic.  Suspect that she has esophagitis secondary to vomiting.  11:25 PM     CT consistent w/ enteritis.  Results discussed w/ pt.  Abd/CP resolved.  She is tolerating pos.  Repeat abd exam unchanged.  D/c'd home w/ zofran and vicodin.  Advised f/u with PCP.  SOB likely secondary to vascular congestion from non-compliance w/ lasix.  She will resume this medication when she gets home.  Return precautions discussed. 1:37 AM   Otilio Miu, PA-C 01/21/13 [phone]

## 2013-01-20 NOTE — ED Notes (Signed)
Patient has not received oral contrast needed for CT scan. CT called to assess time frame for scan. CT personnel unable to provide estimated time, but stated that they would ensure patient receives oral contrast soon. Message relayed to patient.

## 2013-01-20 NOTE — ED Notes (Signed)
Called CT to inquire about delay. CT personnel indicated that patient has not yet drank oral contrast. Informed CT staff that patient has already consumed oral contrast and this RN called to inform around 2300.

## 2013-01-20 NOTE — ED Provider Notes (Signed)
CSN: [id number]     Arrival date & time 01/20/13  1806 History   First MD Initiated Contact with Patient 01/20/13 1816     Chief Complaint  Patient presents with  . Shortness of Breath  . Emesis  . Diarrhea   (Consider location/radiation/quality/duration/timing/severity/associated sxs/prior Treatment) HPI Comments: Patient is a 62 year old female history of congestive heart failure, diabetes, hypertension, hyperthyroidism, Graves' disease who presents today with nausea, vomiting, abdominal pain, diarrhea, shortness of breath, cough since last night. Her abdominal pain is cramping in nature. The pain is worse in her left lower quadrant and radiates to her right lower quadrant. Her emesis is nonbloody. Her diarrhea is watery. Her cough is nonproductive. She denies any questionable food, recent travel, recent antibiotic use. No one else has these symptoms. A family member was diagnosed with bronchitis yesterday. She denies any fevers, chills.   Patient is a 62 y.o. female presenting with shortness of breath, vomiting, and diarrhea. The history is provided by the patient. No language interpreter was used.  Shortness of Breath Associated symptoms: abdominal pain, cough and vomiting   Associated symptoms: no fever   Emesis Associated symptoms: abdominal pain and diarrhea   Associated symptoms: no chills   Diarrhea Associated symptoms: abdominal pain and vomiting   Associated symptoms: no chills and no fever     Past Medical History  Diagnosis Date  . Palpitations   . Obesity   . CARDIOMYOPATHY     Nonischemic. Admitted in 3/11 with CHF exac, due to hyperthyroidism. LHC (03/11) showed clean coronaries with EF 40%. Myoview showed EF 38%. Echo was a technically difficult study with mild to moderately decreased EF, mild LVH, mild MR. Repeat echo (9/11) with EF 55-60%, mild diastolic dysfunction (grade I).  . Congestive heart failure, unspecified   . Osteoarthritis     esp L hip, low back  .  ALLERGIC RHINITIS   . DIABETES MELLITUS, CONTROLLED   . DYSLIPIDEMIA   . HYPERTENSION   . HYPERTHYROIDISM   . CARPAL TUNNEL SYNDROME, LEFT, MILD   . Graves disease    Past Surgical History  Procedure Laterality Date  . Cholecystectomy    . Remote hernia repair    . Cardiac catheterization  04/19/09   Family History  Problem Relation Age of Onset  . Diabetes Mother   . Breast cancer Mother   . Kidney cancer Father     Kidney Cancer  . Stroke Father     CVA  . Cervical cancer Mother   . Supraventricular tachycardia Sister   . Asthma      family history   History  Substance Use Topics  . Smoking status: Never Smoker   . Smokeless tobacco: Not on file  . Alcohol Use: No   OB History   Grav Para Term Preterm Abortions TAB SAB Ect Mult Living                 Review of Systems  Constitutional: Negative for fever and chills.  HENT: Positive for congestion.   Respiratory: Positive for cough and shortness of breath.   Gastrointestinal: Positive for nausea, vomiting, abdominal pain and diarrhea.  All other systems reviewed and are negative.    Allergies  Review of patient's allergies indicates no known allergies.  Home Medications   Current Outpatient Rx  Name  Route  Sig  Dispense  Refill  . amitriptyline (ELAVIL) 25 MG tablet   Oral   Take 1 tablet (25 mg total) by mouth at  bedtime.   90 tablet   1   . aspirin 81 MG tablet   Oral   Take 81 mg by mouth daily.           . carvedilol (COREG) 25 MG tablet   Oral   Take 1 tablet (25 mg total) by mouth 2 (two) times daily with a meal.   180 tablet   3   . cyclobenzaprine (FLEXERIL) 10 MG tablet   Oral   Take 1 tablet (10 mg total) by mouth every 8 (eight) hours as needed for muscle spasms.   90 tablet   1   . diphenhydrAMINE (BENADRYL) 25 mg capsule   Oral   Take 25 mg by mouth every 4 (four) hours as needed. For allergies          . enalapril (VASOTEC) 10 MG tablet   Oral   Take 1 tablet (10 mg  total) by mouth daily.   90 tablet   3   . furosemide (LASIX) 20 MG tablet   Oral   Take 1 tablet (20 mg total) by mouth daily.   90 tablet   3   . HYDROcodone-acetaminophen (NORCO/VICODIN) 5-325 MG per tablet   Oral   Take 1 tablet by mouth every 8 (eight) hours as needed for pain.   90 tablet   0   . Hylan (SYNVISC) 16 MG/2ML SOSY   Intra-articular   Inject 1 Syringe into the articular space once a week.   3 Syringe   0   . lovastatin (MEVACOR) 10 MG tablet   Oral   Take 1 tablet (10 mg total) by mouth daily.   90 tablet   3   . meloxicam (MOBIC) 15 MG tablet   Oral   Take 1 tablet (15 mg total) by mouth daily.   30 tablet   0   . meloxicam (MOBIC) 15 MG tablet   Oral   Take 1 tablet (15 mg total) by mouth daily.   30 tablet   2   . methimazole (TAPAZOLE) 10 MG tablet   Oral   Take 1 tablet (10 mg total) by mouth daily.   90 tablet   3   . Multiple Vitamin (MULTIVITAMIN) tablet   Oral   Take 1 tablet by mouth daily.           . naproxen (NAPROSYN) 500 MG tablet   Oral   Take 1 tablet (500 mg total) by mouth 2 (two) times daily with a meal.   180 tablet   1   . traMADol (ULTRAM) 50 MG tablet   Oral   Take 1 tablet (50 mg total) by mouth every 6 (six) hours as needed for pain.   270 tablet   0   . triamcinolone cream (KENALOG) 0.1 %   Topical   Apply topically 3 (three) times daily. As needed for itching or rash   30 g   0    BP 106/65  Temp(Src) 98.1 F (36.7 C) (Oral)  Resp 18  SpO2 100% Physical Exam  Nursing note and vitals reviewed. Constitutional: She is oriented to person, place, and time. She appears well-developed and well-nourished. No distress.  Morbidly obese  HENT:  Head: Normocephalic and atraumatic.  Right Ear: External ear normal.  Left Ear: External ear normal.  Nose: Nose normal.  Mouth/Throat: Oropharynx is clear and moist.  Eyes: Conjunctivae are normal.  Neck: Normal range of motion.  Cardiovascular: Normal  rate, regular rhythm  and normal heart sounds.   Pulmonary/Chest: Effort normal and breath sounds normal. No stridor. No respiratory distress. She has no wheezes. She has no rales.  Abdominal: Soft. She exhibits no distension. There is tenderness in the right upper quadrant, epigastric area and left upper quadrant.  Musculoskeletal: Normal range of motion.  Neurological: She is alert and oriented to person, place, and time. She has normal strength.  Skin: Skin is warm and dry. She is not diaphoretic. No erythema.  Psychiatric: She has a normal mood and affect. Her behavior is normal.    ED Course  Procedures (including critical care time) Labs Review Labs Reviewed  CBC WITH DIFFERENTIAL - Abnormal; Notable for the following:    RDW 16.2 (*)    Lymphocytes Relative 11 (*)    All other components within normal limits  URINALYSIS, ROUTINE W REFLEX MICROSCOPIC - Abnormal; Notable for the following:    Color, Urine AMBER (*)    APPearance CLOUDY (*)    Specific Gravity, Urine 1.034 (*)    Bilirubin Urine MODERATE (*)    Ketones, ur 15 (*)    Leukocytes, UA TRACE (*)    All other components within normal limits  URINE MICROSCOPIC-ADD ON - Abnormal; Notable for the following:    Squamous Epithelial / LPF FEW (*)    Bacteria, UA FEW (*)    Casts HYALINE CASTS (*)    All other components within normal limits  COMPREHENSIVE METABOLIC PANEL  LIPASE, BLOOD  PRO B NATRIURETIC PEPTIDE  POCT I-STAT TROPONIN I   Imaging Review No results found.  EKG Interpretation   None       MDM  No diagnosis found.  Pt presents from [facility] for evaluation of abd pain and sob. Labs and imaging pending at this time. CT scan of abd ordered and to be performed after labwork results. Pt's vital signs are stable at this time. Abd is soft and nonsurgical at this time. Pt is oxygenating well. Placed on 2L of oxygen for comfort. Pt signed out to [employer], PA-C at change of shift for further evaluation.      Mora Bellman, PA-C 01/20/13 2037

## 2013-01-21 ENCOUNTER — Emergency Department (HOSPITAL_COMMUNITY)

## 2013-01-21 ENCOUNTER — Encounter (HOSPITAL_COMMUNITY): Payer: Self-pay | Admitting: Radiology

## 2013-01-21 MED ORDER — IOHEXOL 300 MG/ML  SOLN
80.0000 mL | Freq: Once | INTRAMUSCULAR | Status: AC | PRN
  Administered 2013-01-21: 80 mL via INTRAVENOUS

## 2013-01-21 MED ORDER — ONDANSETRON HCL 8 MG PO TABS: 8.0000 mg | ORAL_TABLET | Freq: Three times a day (TID) | ORAL | Status: DC | PRN

## 2013-01-21 MED ORDER — METOCLOPRAMIDE HCL 5 MG/ML IJ SOLN
10.0000 mg | Freq: Once | INTRAMUSCULAR | Status: AC
  Administered 2013-01-21: 10 mg via INTRAVENOUS
  Filled 2013-01-21: qty 2

## 2013-01-21 MED ORDER — HYDROCODONE-ACETAMINOPHEN 5-325 MG PO TABS: 1.0000 | ORAL_TABLET | ORAL | Status: DC | PRN

## 2013-01-22 ENCOUNTER — Ambulatory Visit: Admitting: Family Medicine

## 2013-01-24 ENCOUNTER — Ambulatory Visit (INDEPENDENT_AMBULATORY_CARE_PROVIDER_SITE_OTHER): Admitting: Family Medicine

## 2013-01-24 ENCOUNTER — Encounter: Payer: Self-pay | Admitting: Family Medicine

## 2013-01-24 VITALS — BP 112/64 | HR 95

## 2013-01-24 DIAGNOSIS — M1711 Unilateral primary osteoarthritis, right knee: Secondary | ICD-10-CM

## 2013-01-24 DIAGNOSIS — M1710 Unilateral primary osteoarthritis, unspecified knee: Secondary | ICD-10-CM

## 2013-01-24 NOTE — Progress Notes (Signed)
Pre-visit discussion using our clinic review tool. No additional management support is needed unless otherwise documented below in the visit note.  

## 2013-01-24 NOTE — Progress Notes (Signed)
  CC: Right knee pain followup Here for synvisc #2  HPI: Patient is a 62 year old female who had right knee pain Patient was diagnosed with severe osteoarthritis of the right knee as well as popliteus subluxation. Patient had first Synvisc injection and did very well. Patient states she thinks is approximately 20-30% better. Unfortunately she has a stomach flu and has not been doing the exercises regularly. Denies any new symptoms of the knee though..  Past medical, surgical, family and social history reviewed. Medications reviewed all in the electronic medical record.   Review of Systems: No headache, visual changes, nausea, vomiting, diarrhea, constipation, dizziness, abdominal pain, skin rash, fevers, chills, night sweats, weight loss, swollen lymph nodes, body aches, joint swelling, muscle aches, chest pain, shortness of breath, mood changes.   Objective:    Blood pressure 112/64, pulse 95, SpO2 97.00%.   General: No apparent distress alert and oriented x3 mood and affect normal, dressed appropriately. Morbidly obese and in a wheelchair HEENT: Pupils equal, extraocular movements intact Respiratory: Patient's speak in full sentences and does not appear short of breath Cardiovascular: No lower extremity edema, non tender, no erythema Skin: Warm dry intact with no signs of infection or rash on extremities or on axial skeleton. Abdomen: Soft nontender Neuro: Cranial nerves II through XII are intact, neurovascularly intact in all extremities with 2+ DTRs and 2+ pulses. Lymph: No lymphadenopathy of posterior or anterior cervical chain or axillae bilaterally.  Gait normal with good balance and coordination.  MSK: Non tender with full range of motion and good stability and symmetric strength and tone of shoulders, elbows, wrist, and ankles bilaterally.  Knee: Right On inspection patient does have osteo arthritic changes  Palpation patient does have tenderness over the lateral joint line and mild  of the medial joint line.[name] ROM is decreased in flexion to 95. Ligaments with solid consistent endpoints including ACL, PCL, LCL, MCL. Negative Mcmurray's, Apley's, and Thessalonian tests. Positive painful patellar compression. Patellar glide without crepitus. Patellar and quadriceps tendons unremarkable. Hamstring and quadriceps strength is normal.  Contralateral side is positive for osteoporotic changes and tenderness on the medial and lateral joint line but full range of motion.  Procedure note.  16 mg/2.5 mL of Synvisc (sodium hyaluronate) in a prefilled syringe was injected easily into the knee through a 22-gauge needle.  Impression and Recommendations:     This case required medical decision making of moderate complexity.

## 2013-01-24 NOTE — Assessment & Plan Note (Signed)
Injected #2 the day of Synvisc. Patient will return again in 10 days secondary to the holidays and we will do her third and final Synvisc injection. Encourage patient to continue the home exercises.

## 2013-01-25 ENCOUNTER — Other Ambulatory Visit: Payer: Self-pay | Admitting: *Deleted

## 2013-01-25 ENCOUNTER — Encounter: Payer: Self-pay | Admitting: Internal Medicine

## 2013-01-25 MED ORDER — ONDANSETRON HCL 8 MG PO TABS: 8.0000 mg | ORAL_TABLET | Freq: Three times a day (TID) | ORAL | Status: DC | PRN

## 2013-01-25 NOTE — ED Provider Notes (Signed)
Medical screening examination/treatment/procedure(s) were performed by non-physician practitioner and as supervising physician I was immediately available for consultation/collaboration.  EKG Interpretation    Date/Time:  Monday January 20 2013 19:32:09 EST Ventricular Rate:  102 PR Interval:  163 QRS Duration: 93 QT Interval:  335 QTC Calculation: 436 R Axis:   10 Text Interpretation:  Sinus tachycardia Low voltage, precordial leads ED PHYSICIAN INTERPRETATION AVAILABLE IN [facility] Confirmed by TEST, RECORD ([id number]) on 01/22/2013 9:18:43 AM              Roney Marion, MD 01/25/13 [id number]

## 2013-01-25 NOTE — ED Provider Notes (Signed)
Medical screening examination/treatment/procedure(s) were performed by non-physician practitioner and as supervising physician I was immediately available for consultation/collaboration.  EKG Interpretation    Date/Time:  Monday January 20 2013 19:32:09 EST Ventricular Rate:  102 PR Interval:  163 QRS Duration: 93 QT Interval:  335 QTC Calculation: 436 R Axis:   10 Text Interpretation:  Sinus tachycardia Low voltage, precordial leads ED PHYSICIAN INTERPRETATION AVAILABLE IN [facility] Confirmed by TEST, RECORD ([id number]) on 01/22/2013 9:18:43 AM              Mammie Meras J Haden Cavenaugh, MD 01/25/13 0701 

## 2013-01-25 NOTE — Telephone Encounter (Signed)
Sent email needing her Zofran sent to meds-by-mail...lmb

## 2013-01-29 ENCOUNTER — Encounter: Payer: Self-pay | Admitting: Internal Medicine

## 2013-01-31 MED ORDER — HYDROCODONE-ACETAMINOPHEN 5-325 MG PO TABS: 1.0000 | ORAL_TABLET | Freq: Three times a day (TID) | ORAL | Status: DC | PRN

## 2013-01-31 NOTE — Telephone Encounter (Signed)
Place rx up front for pick-up.../lmb 

## 2013-02-03 ENCOUNTER — Ambulatory Visit (INDEPENDENT_AMBULATORY_CARE_PROVIDER_SITE_OTHER): Admitting: Family Medicine

## 2013-02-03 ENCOUNTER — Encounter: Payer: Self-pay | Admitting: Family Medicine

## 2013-02-03 VITALS — BP 110/72 | HR 73

## 2013-02-03 DIAGNOSIS — R110 Nausea: Secondary | ICD-10-CM

## 2013-02-03 DIAGNOSIS — M1710 Unilateral primary osteoarthritis, unspecified knee: Secondary | ICD-10-CM

## 2013-02-03 DIAGNOSIS — M1711 Unilateral primary osteoarthritis, right knee: Secondary | ICD-10-CM

## 2013-02-03 MED ORDER — ONDANSETRON HCL 4 MG/2ML IJ SOLN
4.0000 mg | Freq: Once | INTRAMUSCULAR | Status: AC
  Administered 2013-02-03: 4 mg via INTRAMUSCULAR

## 2013-02-03 NOTE — Assessment & Plan Note (Signed)
Patient has now finished the 3 shot series of Synvisc.  Discuss we will monitor for the next month with her encouraged to do daily workouts. Encouraged about weight loss as well. The patient continues to have pain in one month out like to get new x-rays and we'll need to discuss further intervention such as surgical.

## 2013-02-03 NOTE — Progress Notes (Signed)
Pre-visit discussion using our clinic review tool. No additional management support is needed unless otherwise documented below in the visit note.  

## 2013-02-03 NOTE — Progress Notes (Signed)
  CC: Right knee pain followup Here for synvisc #3  HPI: Patient is a 62 year old female who had right knee pain Patient was diagnosed with severe osteoarthritis of the right knee and is here for her third injection. Patient states that she is doing better. Patient states that she is about 40% better. Patient is still in a wheelchair but states that she does walk at home from time to time. Has not been doing exercises 2 to her recent illness. Patient was happy with result so far.  Past medical, surgical, family and social history reviewed. Medications reviewed all in the electronic medical record.   Review of Systems: No headache, visual changes, nausea, vomiting, diarrhea, constipation, dizziness, abdominal pain, skin rash, fevers, chills, night sweats, weight loss, swollen lymph nodes, body aches, joint swelling, muscle aches, chest pain, shortness of breath, mood changes.   Objective:    Blood pressure 110/72, pulse 73, SpO2 93.00%.   General: No apparent distress alert and oriented x3 mood and affect normal, dressed appropriately. Morbidly obese and in a wheelchair HEENT: Pupils equal, extraocular movements intact Respiratory: Patient's speak in full sentences and does not appear short of breath Cardiovascular: No lower extremity edema, non tender, no erythema Skin: Warm dry intact with no signs of infection or rash on extremities or on axial skeleton. Abdomen: Soft nontender Neuro: Cranial nerves II through XII are intact, neurovascularly intact in all extremities with 2+ DTRs and 2+ pulses. Lymph: No lymphadenopathy of posterior or anterior cervical chain or axillae bilaterally.  Gait normal with good balance and coordination.  MSK: Non tender with full range of motion and good stability and symmetric strength and tone of shoulders, elbows, wrist, and ankles bilaterally.  Knee: Right On inspection patient does have osteo arthritic changes  Palpation patient does have tenderness over  the lateral joint line and mild of the medial joint line.[name] ROM is decreased in flexion to 95. Ligaments with solid consistent endpoints including ACL, PCL, LCL, MCL. Negative Mcmurray's, Apley's, and Thessalonian tests. Positive painful patellar compression. Patellar glide without crepitus. Patellar and quadriceps tendons unremarkable. Hamstring and quadriceps strength is normal.  Contralateral side is positive for osteoporotic changes and tenderness on the medial and lateral joint line but full range of motion.  Procedure note.  16 mg/2.5 mL of Synvisc (sodium hyaluronate) in a prefilled syringe was injected easily into the knee through a 22-gauge needle.  Impression and Recommendations:     This case required medical decision making of moderate complexity.

## 2013-02-19 ENCOUNTER — Ambulatory Visit: Admitting: Internal Medicine

## 2013-02-21 ENCOUNTER — Ambulatory Visit: Admitting: Endocrinology

## 2013-02-24 ENCOUNTER — Ambulatory Visit: Admitting: Internal Medicine

## 2013-02-24 ENCOUNTER — Encounter: Payer: Self-pay | Admitting: Internal Medicine

## 2013-02-24 MED ORDER — PROMETHAZINE-DM 6.25-15 MG/5ML PO SYRP: 5.0000 mL | ORAL_SOLUTION | Freq: Four times a day (QID) | ORAL | Status: DC | PRN

## 2013-02-28 ENCOUNTER — Ambulatory Visit: Admitting: Endocrinology

## 2013-03-03 ENCOUNTER — Ambulatory Visit (INDEPENDENT_AMBULATORY_CARE_PROVIDER_SITE_OTHER): Admitting: Internal Medicine

## 2013-03-03 ENCOUNTER — Encounter: Payer: Self-pay | Admitting: Internal Medicine

## 2013-03-03 ENCOUNTER — Other Ambulatory Visit (INDEPENDENT_AMBULATORY_CARE_PROVIDER_SITE_OTHER)

## 2013-03-03 VITALS — BP 128/72 | HR 80 | Temp 99.9°F | Wt 292.0 lb

## 2013-03-03 DIAGNOSIS — H9193 Unspecified hearing loss, bilateral: Secondary | ICD-10-CM

## 2013-03-03 DIAGNOSIS — R05 Cough: Secondary | ICD-10-CM

## 2013-03-03 DIAGNOSIS — M25561 Pain in right knee: Secondary | ICD-10-CM

## 2013-03-03 DIAGNOSIS — R059 Cough, unspecified: Secondary | ICD-10-CM

## 2013-03-03 DIAGNOSIS — R058 Other specified cough: Secondary | ICD-10-CM

## 2013-03-03 DIAGNOSIS — I10 Essential (primary) hypertension: Secondary | ICD-10-CM

## 2013-03-03 DIAGNOSIS — H9190 Unspecified hearing loss, unspecified ear: Secondary | ICD-10-CM

## 2013-03-03 DIAGNOSIS — E119 Type 2 diabetes mellitus without complications: Secondary | ICD-10-CM

## 2013-03-03 DIAGNOSIS — M25569 Pain in unspecified knee: Secondary | ICD-10-CM

## 2013-03-03 LAB — HEMOGLOBIN A1C: HEMOGLOBIN A1C: 5.8 % (ref 4.6–6.5)

## 2013-03-03 MED ORDER — MELOXICAM 15 MG PO TABS: 15.0000 mg | ORAL_TABLET | Freq: Every day | ORAL | Status: DC

## 2013-03-03 MED ORDER — BENZONATATE 200 MG PO CAPS: 200.0000 mg | ORAL_CAPSULE | Freq: Three times a day (TID) | ORAL | Status: DC | PRN

## 2013-03-03 MED ORDER — CYCLOBENZAPRINE HCL 10 MG PO TABS
10.0000 mg | ORAL_TABLET | Freq: Three times a day (TID) | ORAL | Status: DC | PRN
Start: 2013-03-03 — End: 2013-03-04

## 2013-03-03 MED ORDER — TRAMADOL HCL 50 MG PO TABS: 50.0000 mg | ORAL_TABLET | Freq: Four times a day (QID) | ORAL | Status: DC | PRN

## 2013-03-03 MED ORDER — AMITRIPTYLINE HCL 25 MG PO TABS: 25.0000 mg | ORAL_TABLET | Freq: Every day | ORAL | Status: DC

## 2013-03-03 MED ORDER — HYDROCODONE-ACETAMINOPHEN 5-325 MG PO TABS: 1.0000 | ORAL_TABLET | Freq: Three times a day (TID) | ORAL | Status: DC | PRN

## 2013-03-03 MED ORDER — FREESTYLE SYSTEM KIT: PACK | Status: DC

## 2013-03-03 NOTE — Assessment & Plan Note (Signed)
Ongoing care with sports med reviewed S/p hylan injection for OA Continue hydrocodone prn - refill today

## 2013-03-03 NOTE — Patient Instructions (Addendum)
It was good to see you today.  We have reviewed your prior records including labs and tests today  If you develop worsening symptoms or fever, call and we can reconsider antibiotics, but it does not appear necessary to use antibiotics at this time.  Medications reviewed and updated: use tessalon as needed for cough -no other changes recommended today Refill on medication(s) as discussed today.  Test(s) ordered today. Your results will be released to [facility] (or called to you) after review, usually within 72hours after test completion. If any changes need to be made, you will be notified at that same time.  Please schedule followup in 6 months for diabetes mellitus and weight check, call sooner if problems.  Followup with Dr. [name] and cardiology specialists as ongoing  we'll make referral to audiology. Our office will contact you regarding appointment(s) once made.

## 2013-03-03 NOTE — Assessment & Plan Note (Signed)
BP Readings from Last 3 Encounters:  03/03/13 128/72  02/03/13 110/72  01/24/13 112/64   The current medical regimen is effective;  continue present plan and medications.

## 2013-03-03 NOTE — Progress Notes (Signed)
Pre-visit discussion using our clinic review tool. No additional management support is needed unless otherwise documented below in the visit note.  

## 2013-03-03 NOTE — Assessment & Plan Note (Signed)
Diet controlled - importance of diet and weight control reviewed - On ACEI and statin + ASA 81 Follows with intermittently with endo for same Check a1c q84mo - Lab Results  Component Value Date   HGBA1C 5.7 05/15/2012

## 2013-03-03 NOTE — Progress Notes (Signed)
   Subjective:    Patient ID: Katie Higgins, female    DOB: September 08, 1950, 63 y.o.   MRN: [id number]  HPI  Here for followup -reviewed chronic medical issues and interval medical event  Past Medical History  Diagnosis Date  . Palpitations   . Obesity   . CARDIOMYOPATHY     Nonischemic. Admitted in 3/11 with CHF exac, due to hyperthyroidism. LHC (03/11) showed clean coronaries with EF 40%. Myoview showed EF 38%. Echo was a technically difficult study with mild to moderately decreased EF, mild LVH, mild MR. Repeat echo (9/11) with EF 23-53%, mild diastolic dysfunction (grade I).  . Congestive heart failure, unspecified   . Osteoarthritis     esp L hip, low back  . ALLERGIC RHINITIS   . DIABETES MELLITUS, CONTROLLED   . DYSLIPIDEMIA   . HYPERTENSION   . HYPERTHYROIDISM   . CARPAL TUNNEL SYNDROME, LEFT, MILD   . Graves disease     Review of Systems  HENT: Positive for hearing loss (bilaterally, graducal over past 6 mo), postnasal drip and rhinorrhea. Negative for ear pain.   Respiratory: Positive for cough (dry x 2 weeks). Negative for chest tightness, shortness of breath and wheezing.   Cardiovascular: Negative for chest pain and leg swelling.  Gastrointestinal: Positive for constipation (worse with hydrocodone). Negative for abdominal pain.  Musculoskeletal: Positive for arthralgias (R knee pain x 6 mo, following with sports med for same).       Objective:   Physical Exam BP 128/72  Pulse 80  Temp(Src) 99.9 F (37.7 C) (Oral)  Wt 292 lb (132.45 kg)  SpO2 97% Wt Readings from Last 3 Encounters:  03/03/13 292 lb (132.45 kg)  08/20/12 294 lb 7 oz (133.556 kg)  05/15/12 290 lb 9.6 oz (131.815 kg)   Constitutional: She is overweight; appears well-developed and well-nourished. No distress. sitting in manual WC (usual) - dtr in law at side HENT: Ears - B clear without cerumen Eyes: Wears corrective lenses. PERRLA, EOMI. Vision grossly intact. No conjunctivitis or  icterus Neck: Mild thyromegaly, nontender. No appreciable nodules. No JVD or LAD. Full range of motion Cardiovascular: Normal rate, regular rhythm and normal heart sounds.  No murmur heard. No BLE edema. Pulmonary/Chest: Effort normal and breath sounds normal. No respiratory distress. She has no wheezes.  Abdomen: Large pannus. Soft, nontender, positive bowel sounds  Psychiatric: She has a normal mood and affect. Her behavior is normal. Judgment and thought content normal.   Lab Results  Component Value Date   WBC 7.1 01/20/2013   HGB 12.9 01/20/2013   HCT 39.7 01/20/2013   PLT 288 01/20/2013   GLUCOSE 118* 01/20/2013   CHOL 110 05/08/2012   TRIG 61.0 05/08/2012   HDL 38.00* 05/08/2012   LDLCALC 60 05/08/2012   ALT 17 01/20/2013   AST 21 01/20/2013   NA 137 01/20/2013   K 4.2 01/20/2013   CL 101 01/20/2013   CREATININE 1.53* 01/20/2013   BUN 25* 01/20/2013   CO2 21 01/20/2013   TSH 3.12 08/20/2012   INR 1.11 04/19/2009   HGBA1C 5.7 05/15/2012       Assessment & Plan:    See problem list. Medications and labs reviewed today.  Hearing loss, bilateral, gradual - refer to audiology  Cough - post viral x 2 weeks - LGF but no sputum or congestion. Will change antitussive to tessalon - pt to call if worse or unimproved

## 2013-03-04 ENCOUNTER — Ambulatory Visit (INDEPENDENT_AMBULATORY_CARE_PROVIDER_SITE_OTHER): Admitting: Endocrinology

## 2013-03-04 ENCOUNTER — Other Ambulatory Visit: Payer: Self-pay | Admitting: *Deleted

## 2013-03-04 ENCOUNTER — Encounter: Payer: Self-pay | Admitting: Internal Medicine

## 2013-03-04 ENCOUNTER — Telehealth: Payer: Self-pay

## 2013-03-04 ENCOUNTER — Encounter: Payer: Self-pay | Admitting: Endocrinology

## 2013-03-04 VITALS — BP 114/62 | HR 98 | Temp 98.2°F | Wt 292.0 lb

## 2013-03-04 DIAGNOSIS — E0590 Thyrotoxicosis, unspecified without thyrotoxic crisis or storm: Secondary | ICD-10-CM

## 2013-03-04 LAB — BASIC METABOLIC PANEL
BUN: 20 mg/dL (ref 6–23)
CO2: 22 mEq/L (ref 19–32)
Calcium: 9.3 mg/dL (ref 8.4–10.5)
Chloride: 105 mEq/L (ref 96–112)
Creatinine, Ser: 1.6 mg/dL — ABNORMAL HIGH (ref 0.4–1.2)
GFR: 35.84 mL/min — AB (ref >60.00)
Glucose, Bld: 115 mg/dL — ABNORMAL HIGH (ref 70–99)
POTASSIUM: 4.5 meq/L (ref 3.5–5.1)
SODIUM: 139 meq/L (ref 135–145)

## 2013-03-04 MED ORDER — CYCLOBENZAPRINE HCL 10 MG PO TABS: 10.0000 mg | ORAL_TABLET | Freq: Three times a day (TID) | ORAL | Status: DC | PRN

## 2013-03-04 MED ORDER — AMITRIPTYLINE HCL 25 MG PO TABS: 25.0000 mg | ORAL_TABLET | Freq: Every day | ORAL | Status: DC

## 2013-03-04 MED ORDER — MELOXICAM 15 MG PO TABS: 15.0000 mg | ORAL_TABLET | Freq: Every day | ORAL | Status: DC

## 2013-03-04 NOTE — Telephone Encounter (Signed)
Called pharmacy spoke with Inez Catalina gave md response...Katie Higgins

## 2013-03-04 NOTE — Telephone Encounter (Signed)
Drai called to follow up on the request.

## 2013-03-04 NOTE — Progress Notes (Signed)
Subjective:    Patient ID: Katie Higgins, female    DOB: 1950-02-10, 63 y.o.   MRN: [id number]  HPI Pt returns for f/u of grave's dz (dx'ed 2010; US showed heterogeneity of texture and one small nodule; she has been unable to d/c tapazole for i-131 rx, due to chf).  pt states she feels well in general.   Past Medical History  Diagnosis Date  . Palpitations   . Obesity   . CARDIOMYOPATHY     Nonischemic. Admitted in 3/11 with CHF exac, due to hyperthyroidism. LHC (03/11) showed clean coronaries with EF 40%. Myoview showed EF 38%. Echo was a technically difficult study with mild to moderately decreased EF, mild LVH, mild MR. Repeat echo (9/11) with EF 10-25%, mild diastolic dysfunction (grade I).  . Congestive heart failure, unspecified   . Osteoarthritis     esp L hip, low back  . ALLERGIC RHINITIS   . DIABETES MELLITUS, CONTROLLED   . DYSLIPIDEMIA   . HYPERTENSION   . HYPERTHYROIDISM   . CARPAL TUNNEL SYNDROME, LEFT, MILD   . Graves disease     Past Surgical History  Procedure Laterality Date  . Cholecystectomy    . Remote hernia repair    . Cardiac catheterization  04/19/09    History   Social History  . Marital Status: Widowed    Spouse Name: N/A    Number of Children: N/A  . Years of Education: N/A   Occupational History  . Not on file.   Social History Main Topics  . Smoking status: Never Smoker   . Smokeless tobacco: Not on file  . Alcohol Use: No  . Drug Use: No  . Sexual Activity: Not on file   Other Topics Concern  . Not on file   Social History Narrative   Lives in [place] with her son.   Widowed since 1992.   She is very sedentary, uses a wheelchair when out of the house.   Ambulatory in home with cane.    Current Outpatient Prescriptions on File Prior to Visit  Medication Sig Dispense Refill  . aspirin 81 MG tablet Take 81 mg by mouth daily.        . benzonatate (TESSALON) 200 MG capsule Take 1 capsule (200 mg total) by mouth 3 (three)  times daily as needed for cough.  30 capsule  1  . carvedilol (COREG) 25 MG tablet Take 1 tablet (25 mg total) by mouth 2 (two) times daily with a meal.  180 tablet  3  . diphenhydrAMINE (BENADRYL) 25 mg capsule Take 25 mg by mouth every 4 (four) hours as needed. For allergies       . enalapril (VASOTEC) 10 MG tablet Take 1 tablet (10 mg total) by mouth daily.  90 tablet  3  . furosemide (LASIX) 20 MG tablet Take 1 tablet (20 mg total) by mouth daily.  90 tablet  3  . glucose monitoring kit (FREESTYLE) monitoring kit Any glucometer and supplies (strips and lancets) Dx 250.00  1 each  0  . HYDROcodone-acetaminophen (NORCO/VICODIN) 5-325 MG per tablet Take 1 tablet by mouth every 8 (eight) hours as needed for moderate pain.  90 tablet  0  . lovastatin (MEVACOR) 10 MG tablet Take 1 tablet (10 mg total) by mouth daily.  90 tablet  3  . methimazole (TAPAZOLE) 10 MG tablet Take 1 tablet (10 mg total) by mouth daily.  90 tablet  3  . Multiple Vitamin (MULTIVITAMIN) tablet Take  1 tablet by mouth daily.        . ondansetron (ZOFRAN) 8 MG tablet Take 1 tablet (8 mg total) by mouth every 8 (eight) hours as needed for nausea or vomiting.  90 tablet  0  . promethazine-dextromethorphan (PROMETHAZINE-DM) 6.25-15 MG/5ML syrup Take 5 mLs by mouth 4 (four) times daily as needed for cough.  120 mL  0  . traMADol (ULTRAM) 50 MG tablet Take 1 tablet (50 mg total) by mouth every 6 (six) hours as needed.  270 tablet  0   No current facility-administered medications on file prior to visit.    No Known Allergies  Family History  Problem Relation Age of Onset  . Diabetes Mother   . Breast cancer Mother   . Kidney cancer Father     Kidney Cancer  . Stroke Father     CVA  . Cervical cancer Mother   . Supraventricular tachycardia Sister   . Asthma      family history    BP 114/62  Pulse 98  Temp(Src) 98.2 F (36.8 C) (Oral)  Wt 292 lb (132.45 kg)  SpO2 97%  Review of Systems Denies fever and weight  change    Objective:   Physical Exam VITAL SIGNS:  See vs page GENERAL: no distress Skin: not diaphoretic Neuro: no tremor  Lab Results  Component Value Date   TSH 3.12 08/20/2012      Assessment & Plan:  Hyperthyroidism, well-controlled CHF: this limits rx options.

## 2013-03-04 NOTE — Telephone Encounter (Signed)
The pharmacy called and is hoping to change the dosage of the Tessalon Pearls medications - wanting to change to 100mg  for two capsules, three times a day, #60, for cough.   Contact - Drai at [facility] on [address] - callback - [phone]

## 2013-03-04 NOTE — Telephone Encounter (Signed)
Sent email needing amitriptyline, flexeril and meloxicam sent to meds by mail...Katie Higgins

## 2013-03-04 NOTE — Telephone Encounter (Signed)
Yes - ok to change as requested thanks

## 2013-03-04 NOTE — Patient Instructions (Signed)
Thyroid blood tests are being requested for you today.  We'll contact you with results.  Please come back for a follow-up appointment in 6 months.   if ever you have fever while taking methimazole, stop it and call us, because of the risk of a rare side-effect.   

## 2013-03-05 ENCOUNTER — Encounter: Payer: Self-pay | Admitting: Internal Medicine

## 2013-03-06 LAB — TSH: TSH: 3.87 u[IU]/mL (ref 0.35–5.50)

## 2013-03-07 ENCOUNTER — Other Ambulatory Visit: Payer: Self-pay | Admitting: Endocrinology

## 2013-03-07 ENCOUNTER — Encounter: Payer: Self-pay | Admitting: Endocrinology

## 2013-03-07 MED ORDER — METHIMAZOLE 10 MG PO TABS: 10.0000 mg | ORAL_TABLET | Freq: Every day | ORAL | Status: DC

## 2013-03-17 ENCOUNTER — Encounter: Payer: Self-pay | Admitting: Internal Medicine

## 2013-03-18 ENCOUNTER — Encounter: Payer: Self-pay | Admitting: Family Medicine

## 2013-04-07 ENCOUNTER — Encounter: Payer: Self-pay | Admitting: Internal Medicine

## 2013-04-14 ENCOUNTER — Encounter: Payer: Self-pay | Admitting: Internal Medicine

## 2013-04-15 NOTE — Telephone Encounter (Signed)
Please call in Tylenol #3 as requested - see pending med order above thanks

## 2013-04-15 NOTE — Telephone Encounter (Signed)
Can't call-in tylenol # 3 pt has to pick rx and take to the pharmacy...Katie Higgins

## 2013-04-16 ENCOUNTER — Telehealth: Payer: Self-pay | Admitting: *Deleted

## 2013-04-16 MED ORDER — ACETAMINOPHEN-CODEINE #3 300-30 MG PO TABS: 1.0000 | ORAL_TABLET | Freq: Three times a day (TID) | ORAL | Status: DC | PRN

## 2013-04-16 NOTE — Telephone Encounter (Signed)
Pt sent md email. She is changing hydrocodone to tylenol #3. Pt wanting rx call into pharmacy but we can't call rx she has to pick rx up. Pt has been notified by email...[name]

## 2013-04-16 NOTE — Telephone Encounter (Signed)
Generated rx sent pt email she can pick up tomorrow once md sign...Katie Higgins

## 2013-04-17 ENCOUNTER — Other Ambulatory Visit: Payer: Self-pay | Admitting: *Deleted

## 2013-04-17 ENCOUNTER — Encounter: Payer: Self-pay | Admitting: Cardiology

## 2013-04-17 DIAGNOSIS — I5032 Chronic diastolic (congestive) heart failure: Secondary | ICD-10-CM

## 2013-04-17 MED ORDER — ENALAPRIL MALEATE 10 MG PO TABS: 10.0000 mg | ORAL_TABLET | Freq: Every day | ORAL | Status: DC

## 2013-04-17 MED ORDER — CARVEDILOL 25 MG PO TABS: 25.0000 mg | ORAL_TABLET | Freq: Two times a day (BID) | ORAL | Status: DC

## 2013-04-17 MED ORDER — LOVASTATIN 10 MG PO TABS: 10.0000 mg | ORAL_TABLET | Freq: Every day | ORAL | Status: DC

## 2013-04-29 ENCOUNTER — Encounter: Payer: Self-pay | Admitting: Internal Medicine

## 2013-04-29 MED ORDER — TRAMADOL HCL 50 MG PO TABS: 50.0000 mg | ORAL_TABLET | Freq: Four times a day (QID) | ORAL | Status: DC | PRN

## 2013-04-29 NOTE — Telephone Encounter (Signed)
rx printed and signed - please let pt know

## 2013-04-30 NOTE — Telephone Encounter (Signed)
Faxed script to mails-by-mail.../lmb 

## 2013-05-19 ENCOUNTER — Other Ambulatory Visit: Payer: Self-pay | Admitting: *Deleted

## 2013-05-19 ENCOUNTER — Encounter: Payer: Self-pay | Admitting: Endocrinology

## 2013-05-19 MED ORDER — METHIMAZOLE 10 MG PO TABS: 10.0000 mg | ORAL_TABLET | Freq: Every day | ORAL | Status: DC

## 2013-05-30 ENCOUNTER — Encounter: Payer: Self-pay | Admitting: Internal Medicine

## 2013-06-02 MED ORDER — ONDANSETRON HCL 8 MG PO TABS
8.0000 mg | ORAL_TABLET | Freq: Three times a day (TID) | ORAL | Status: DC | PRN
Start: 2013-06-02 — End: 2013-06-11

## 2013-06-02 MED ORDER — BENZONATATE 200 MG PO CAPS: 200.0000 mg | ORAL_CAPSULE | Freq: Three times a day (TID) | ORAL | Status: DC | PRN

## 2013-06-02 MED ORDER — PROMETHAZINE-DM 6.25-15 MG/5ML PO SYRP: 5.0000 mL | ORAL_SOLUTION | Freq: Four times a day (QID) | ORAL | Status: DC | PRN

## 2013-06-05 ENCOUNTER — Encounter: Payer: Self-pay | Admitting: Internal Medicine

## 2013-06-10 ENCOUNTER — Ambulatory Visit: Admitting: Cardiology

## 2013-06-11 ENCOUNTER — Other Ambulatory Visit: Payer: Self-pay | Admitting: *Deleted

## 2013-06-11 ENCOUNTER — Encounter: Payer: Self-pay | Admitting: Internal Medicine

## 2013-06-11 MED ORDER — ONDANSETRON HCL 8 MG PO TABS: 8.0000 mg | ORAL_TABLET | Freq: Three times a day (TID) | ORAL | Status: DC | PRN

## 2013-06-11 NOTE — Telephone Encounter (Signed)
Sent email needing her generic zofran sent to her mail service mails-by meds...[name]

## 2013-06-12 ENCOUNTER — Encounter: Payer: Self-pay | Admitting: Cardiology

## 2013-06-12 ENCOUNTER — Ambulatory Visit (INDEPENDENT_AMBULATORY_CARE_PROVIDER_SITE_OTHER): Admitting: Cardiology

## 2013-06-12 VITALS — BP 132/69 | HR 81 | Ht 62.0 in

## 2013-06-12 DIAGNOSIS — E785 Hyperlipidemia, unspecified: Secondary | ICD-10-CM

## 2013-06-12 DIAGNOSIS — I428 Other cardiomyopathies: Secondary | ICD-10-CM

## 2013-06-12 DIAGNOSIS — I5032 Chronic diastolic (congestive) heart failure: Secondary | ICD-10-CM

## 2013-06-12 DIAGNOSIS — I10 Essential (primary) hypertension: Secondary | ICD-10-CM

## 2013-06-12 MED ORDER — FUROSEMIDE 20 MG PO TABS: 20.0000 mg | ORAL_TABLET | Freq: Every day | ORAL | Status: DC

## 2013-06-12 MED ORDER — ENALAPRIL MALEATE 10 MG PO TABS: 10.0000 mg | ORAL_TABLET | Freq: Every day | ORAL | Status: DC

## 2013-06-12 MED ORDER — CARVEDILOL 25 MG PO TABS: 25.0000 mg | ORAL_TABLET | Freq: Two times a day (BID) | ORAL | Status: DC

## 2013-06-12 MED ORDER — LOVASTATIN 10 MG PO TABS: 10.0000 mg | ORAL_TABLET | Freq: Every day | ORAL | Status: DC

## 2013-06-12 NOTE — Patient Instructions (Signed)
Your physician recommends that you have lab work today--BMET/Lipid profile.  Your physician wants you to follow-up in: 1 year with Dr [name]. (May 2016).  You will receive a reminder letter in the mail two months in advance. If you don't receive a letter, please call our office to schedule the follow-up appointment.

## 2013-06-13 ENCOUNTER — Encounter: Payer: Self-pay | Admitting: Cardiology

## 2013-06-13 LAB — BASIC METABOLIC PANEL
BUN: 18 mg/dL (ref 6–23)
CALCIUM: 8.8 mg/dL (ref 8.4–10.5)
CHLORIDE: 105 meq/L (ref 96–112)
CO2: 28 meq/L (ref 19–32)
Creatinine, Ser: 1.4 mg/dL — ABNORMAL HIGH (ref 0.4–1.2)
GFR: 40.61 mL/min — ABNORMAL LOW (ref >60.00)
Glucose, Bld: 102 mg/dL — ABNORMAL HIGH (ref 70–99)
POTASSIUM: 4.5 meq/L (ref 3.5–5.1)
SODIUM: 140 meq/L (ref 135–145)

## 2013-06-13 LAB — LIPID PANEL
CHOL/HDL RATIO: 2
Cholesterol: 114 mg/dL (ref 0–200)
HDL: 49 mg/dL (ref >39.00)
LDL Cholesterol: 53 mg/dL (ref 0–99)
TRIGLYCERIDES: 62 mg/dL (ref 0.0–149.0)
VLDL: 12.4 mg/dL (ref 0.0–40.0)

## 2013-06-13 NOTE — Progress Notes (Signed)
Patient ID: Katie Higgins, female   DOB: Jul 03, 1950, 63 y.o.   MRN: [id number] PCP: Dr. Asa Lente  63 yo with history of obesity was admitted to [facility] in 3/11 with shortness of breath. She was found to have a CHF exacerbation. TSH was noted to be suppressed and free T4 was very high. Patient was begun on methimazole and diuresed. Echo was a difficult study but showed mild to moderately decreased EF. Myoview showed EF 38% with multiple fixed deficits. Left heart cath showed no significant coronary disease with EF 40%. It was thought that her cardiomyopathy may be the result of hyperthyroidism. Hyperthyroidism is now being treated by Dr. [name]. Repeat echo in 9/11 showed EF 55-60%.  She is stable symptomatically. She has been in a wheelchair when outside the house for a long time due to low back and hip arthritis. She has refused total hip replacement in the past. She is able to walk short distances in her house without dyspnea with her walker. 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  No chest pain. She is taking Lasix three times a week.   Labs (3/11): K 4, creatinine 0.7, BNP 603=>230, free T4 3.2 (high), free T3 8.9 (high), TSH 0.008, LDL 49, HDL 45, HCT 33.5  Labs (4/11): k 3.8, creatinine 0.8, BNP 47  Labs (8/11): creatinine 1.3  Labs (8/12): K 4.8, creatinine 1.13 Labs (12/12): TSH normal Labs (4/13): LDL 59, HDL 43 Labs (10/13): K 5.5, creatinine 1.4 Labs (1/14): TSH normal Labs (4/14): LDL 60, HDL 38 Labs (1/15): TSH normal, K 4.5, creatinine 1.6  Allergies (verified):  1) ! * Hydrocodone/apap   Past Medical History:  1. DM: diet-controlled  2. HYPERTENSION   3. HYPERTHYROIDISM ( 4. CARDIOMYOPATHY: Nonischemic. Admitted in 3/11 with CHF exac, due to hyperthyroidism. LHC (3/11) showed clean coronaries with EF 40%. Myoview showed EF 38%. Echo was a technically difficult study with mild to moderately decreased EF, mild LVH, mild MR. Repeat echo (9/11) with EF 50-38%, mild  diastolic dysfunction (grade I).  5. Osteoarthritis - esp L hip, low back  6. Obesity  7. s/p CCY  8. CKD  Family History:  No premature CAD  mom - expired age 84y - DM, breast cancer, cervical cancer  dad - expired age 63y - CVA, kidney cancer   Social History:  Lives in [place] with her son.  widowed since 1992  She is very sedentary, uses a wheelchair when out of the house.  ambulatory in home with cane  No smoking or ETOH.   Current Outpatient Prescriptions  Medication Sig Dispense Refill  . acetaminophen-codeine (TYLENOL #3) 300-30 MG per tablet Take 1 tablet by mouth every 8 (eight) hours as needed for moderate pain or severe pain.  90 tablet  1  . amitriptyline (ELAVIL) 25 MG tablet Take 1 tablet (25 mg total) by mouth at bedtime.  90 tablet  1  . aspirin 81 MG tablet Take 81 mg by mouth daily.        . benzonatate (TESSALON) 200 MG capsule Take 1 capsule (200 mg total) by mouth 3 (three) times daily as needed for cough.  30 capsule  1  . carvedilol (COREG) 25 MG tablet Take 1 tablet (25 mg total) by mouth 2 (two) times daily with a meal.  180 tablet  3  . cyclobenzaprine (FLEXERIL) 10 MG tablet Take 1 tablet (10 mg total) by mouth every 8 (eight) hours as needed for muscle spasms.  90 tablet  1  . diphenhydrAMINE (BENADRYL) 25 mg capsule Take 25 mg by mouth every 4 (four) hours as needed. For allergies       . enalapril (VASOTEC) 10 MG tablet Take 1 tablet (10 mg total) by mouth daily.  90 tablet  3  . furosemide (LASIX) 20 MG tablet Take 1 tablet (20 mg total) by mouth daily.  90 tablet  3  . glucose monitoring kit (FREESTYLE) monitoring kit Any glucometer and supplies (strips and lancets) Dx 250.00  1 each  0  . lovastatin (MEVACOR) 10 MG tablet Take 1 tablet (10 mg total) by mouth daily.  90 tablet  3  . meloxicam (MOBIC) 15 MG tablet Take 1 tablet (15 mg total) by mouth daily.  90 tablet  1  . methimazole (TAPAZOLE) 10 MG tablet Take 1 tablet (10 mg total) by mouth  daily.  90 tablet  2  . Multiple Vitamin (MULTIVITAMIN) tablet Take 1 tablet by mouth daily.        . ondansetron (ZOFRAN) 8 MG tablet Take 1 tablet (8 mg total) by mouth every 8 (eight) hours as needed for nausea or vomiting.  90 tablet  0  . promethazine-dextromethorphan (PROMETHAZINE-DM) 6.25-15 MG/5ML syrup Take 5 mLs by mouth 4 (four) times daily as needed for cough.  120 mL  0  . traMADol (ULTRAM) 50 MG tablet Take 1 tablet (50 mg total) by mouth every 6 (six) hours as needed.  270 tablet  0   No current facility-administered medications for this visit.    BP 132/69  Pulse 81  Ht _0  (1.575 m) General: NAD, morbidly obese.  Neck: No JVD, no thyromegaly or thyroid nodule.  Lungs: Clear to auscultation bilaterally with normal respiratory effort. CV: Nondisplaced PMI.  Heart regular S1/S2, no S3/S4, no murmur.  Trace ankle edema.  No carotid bruit.  Normal pedal pulses.  Abdomen: Soft, nontender, no hepatosplenomegaly, no distention.   Neurologic: Alert and oriented x 3.  Psych: Normal affect. Extremities: No clubbing or cyanosis.   Assessment/Plan:  1. Morbid obesity: Patient continues to gain weight.  This is her single biggest problem at this time.  We talked again about dietary changes.  She is somewhat limited orthopedically, so I am not sure she is going to be able to increase her exercise level.   2. Cardiomyopathy: Resolved after treatment of hyperthyroidism.  Stable exertional symptoms (mostly limited by orthopedic problems).  She looks euvolemic taking Lasix three times a week.  3. Hyperlipidemia: I will check lipids today.  4. CKD: Repeat BMET today.   Larey Dresser 06/13/2013

## 2013-06-16 ENCOUNTER — Encounter: Payer: Self-pay | Admitting: Internal Medicine

## 2013-06-16 NOTE — Telephone Encounter (Signed)
#  23 of each Last saw Dr Francella Solian 01/15

## 2013-06-19 ENCOUNTER — Encounter: Payer: Self-pay | Admitting: Internal Medicine

## 2013-06-19 ENCOUNTER — Other Ambulatory Visit: Payer: Self-pay | Admitting: *Deleted

## 2013-06-19 MED ORDER — CYCLOBENZAPRINE HCL 10 MG PO TABS: 10.0000 mg | ORAL_TABLET | Freq: Three times a day (TID) | ORAL | Status: DC | PRN

## 2013-06-19 MED ORDER — MELOXICAM 15 MG PO TABS: 15.0000 mg | ORAL_TABLET | Freq: Every day | ORAL | Status: DC

## 2013-06-19 NOTE — Telephone Encounter (Signed)
Pt sent email needing her meloxicam & flexeril sent to meds-by-mail...Katie Higgins

## 2013-06-24 ENCOUNTER — Encounter: Payer: Self-pay | Admitting: Internal Medicine

## 2013-06-24 NOTE — Telephone Encounter (Signed)
Duplicate msg already been taking care except for tramadol waiting on Dr. Asa Lente to approve since she want a 90 sent to meds-by-mail. Dr. Donivan Scull will only give her 30...[name]

## 2013-06-26 MED ORDER — TRAMADOL HCL 50 MG PO TABS
50.0000 mg | ORAL_TABLET | Freq: Four times a day (QID) | ORAL | Status: DC | PRN
Start: 2013-06-26 — End: 2013-09-12

## 2013-08-21 ENCOUNTER — Encounter: Payer: Self-pay | Admitting: Internal Medicine

## 2013-08-25 ENCOUNTER — Encounter: Payer: Self-pay | Admitting: Internal Medicine

## 2013-08-26 ENCOUNTER — Encounter: Payer: Self-pay | Admitting: Internal Medicine

## 2013-08-27 ENCOUNTER — Other Ambulatory Visit: Payer: Self-pay

## 2013-08-27 MED ORDER — MELOXICAM 15 MG PO TABS: 15.0000 mg | ORAL_TABLET | Freq: Every day | ORAL | Status: DC

## 2013-08-28 ENCOUNTER — Encounter: Payer: Self-pay | Admitting: Internal Medicine

## 2013-08-28 ENCOUNTER — Other Ambulatory Visit: Payer: Self-pay | Admitting: Internal Medicine

## 2013-08-29 NOTE — Telephone Encounter (Signed)
Called Pharm to confirm receipt of Mobic... They confirmed.  Called pt to inform that tramadol request was received.

## 2013-09-01 ENCOUNTER — Encounter: Payer: Self-pay | Admitting: Internal Medicine

## 2013-09-01 MED ORDER — AMITRIPTYLINE HCL 25 MG PO TABS: 25.0000 mg | ORAL_TABLET | Freq: Every day | ORAL | Status: DC

## 2013-09-12 ENCOUNTER — Other Ambulatory Visit: Payer: Self-pay | Admitting: Internal Medicine

## 2013-09-12 ENCOUNTER — Encounter: Payer: Self-pay | Admitting: Family Medicine

## 2013-09-12 ENCOUNTER — Ambulatory Visit (INDEPENDENT_AMBULATORY_CARE_PROVIDER_SITE_OTHER): Admitting: Family Medicine

## 2013-09-12 VITALS — BP 132/84 | HR 81 | Temp 98.6°F

## 2013-09-12 DIAGNOSIS — M1711 Unilateral primary osteoarthritis, right knee: Secondary | ICD-10-CM

## 2013-09-12 DIAGNOSIS — M1710 Unilateral primary osteoarthritis, unspecified knee: Secondary | ICD-10-CM

## 2013-09-12 MED ORDER — TRAMADOL HCL 50 MG PO TABS: 50.0000 mg | ORAL_TABLET | Freq: Four times a day (QID) | ORAL | Status: DC | PRN

## 2013-09-12 NOTE — Progress Notes (Signed)
  [name] [address] [place], [place] [address] Phone: [phone] Subjective:     CC: Arthritis pain and multiple joint  [id number] Katie Higgins is a 63 y.o. female coming in with complaint of  Pain in multiple joints. Patient was seen previously for end-stage right knee osteoarthritis status post Synvisc. Patient had a series greater than 9 months ago. Patient states she is doing very well in the last month it is starting to get worse again. Patient states though in addition to his right knee she is also having bilateral shoulder pain for quite some time. Patient does take Flexeril as well as tramadol on a regular basis and needs a refill. Patient states as long as she takes her tramadol she does become more functional. Patient though it is mostly in a wheelchair when she is out of the house and has a walker at home. States that she does as a chronic dull aching pain in multiple joints most days if not every day for a week. Sometimes the pain can wake her up at night.  Patient states that the severity of pain is 8/10. This stops her from many different activities.    Past medical history, social, surgical and family history all reviewed in electronic medical record.   Review of Systems: No headache, visual changes, nausea, vomiting, diarrhea, constipation, dizziness, abdominal pain, skin rash, fevers, chills, night sweats, weight loss, swollen lymph nodes, body aches, joint swelling, muscle aches, chest pain, shortness of breath, mood changes.   Objective Blood pressure 132/84, pulse 81, temperature 98.6 F (37 C), temperature source Oral.  General: No apparent distress alert and oriented x3 mood and affect normal, dressed appropriately.  Obese.  HEENT: Pupils equal, extraocular movements intact  Respiratory: Patient's speak in full sentences and does not appear short of breath  Cardiovascular: No lower extremity edema, non tender, no erythema    Skin: Warm dry intact with no signs of infection or rash on extremities or on axial skeleton.  Abdomen: Soft nontender  Neuro: Cranial nerves II through XII are intact, neurovascularly intact in all extremities with 2+ DTRs and 2+ pulses.  Lymph: No lymphadenopathy of posterior or anterior cervical chain or axillae bilaterally.  Gait normal with good balance and coordination.  MSK:  Non tender with full range of motion and good stability and symmetric strength and tone of shoulders, elbows, wrist, hip, knee and ankles bilaterally. Patient is not cooperating with exam very well today. Patient states that she is in sports even light sensation. Denies numbness or tingling. Neurovascularly intact in all extremities. Knee: Right  On inspection patient does have osteo arthritic changes  Palpation patient does have tenderness over the lateral joint line and mild of the medial joint line.[name]  ROM is decreased in flexion to 95.  Ligaments with solid consistent endpoints including ACL, PCL, LCL, MCL.  Negative Mcmurray's, Apley's, and Thessalonian tests.  Positive painful patellar compression.  Patellar glide without crepitus.  Patellar and quadriceps tendons unremarkable.  Hamstring and quadriceps strength is normal.  Contralateral side is positive for osteoporotic changes and tenderness on the medial and lateral joint line but full range of motion.    Impression and Recommendations:     This case required medical decision making of moderate complexity.

## 2013-09-12 NOTE — Assessment & Plan Note (Signed)
Patient does have arthritis of the right knee and was offered a potential steroid injection. Patient declined today. Patient is having multiple patient multiple joints with vitreous more with arthritis pain we discussed about topical medications. Patient was given a refill of tramadol as well. We discussed how Tylenol in addition to tramadol can have a synergistic effect. We discussed other modalities that could be beneficial. Patient was offered a referral to physical therapy especially cool therapy which patient declined. Patient did have paperwork signed for her transportation. Patient will follow up again in 4 weeks.  Spent greater than 25 minutes with patient face-to-face and had greater than 50% of counseling including as described above in assessment and plan.

## 2013-09-12 NOTE — Patient Instructions (Signed)
Good to see you Adding 325mg  with the tramadol can help with the pain.  Vitamin D 2000 IU daily.  Turmeric 500mg  twice daily can help Weight loss can help as well. Every pound you lose in 4# to your joints.  Heat before activity and ice after activity.  Come back again in 4-6 weeks to see how you are doing.

## 2013-10-14 ENCOUNTER — Other Ambulatory Visit: Payer: Self-pay | Admitting: Internal Medicine

## 2013-10-15 MED ORDER — ONDANSETRON HCL 8 MG PO TABS: 8.0000 mg | ORAL_TABLET | Freq: Three times a day (TID) | ORAL | Status: DC | PRN

## 2013-12-02 ENCOUNTER — Encounter: Payer: Self-pay | Admitting: Internal Medicine

## 2013-12-02 ENCOUNTER — Other Ambulatory Visit (INDEPENDENT_AMBULATORY_CARE_PROVIDER_SITE_OTHER)

## 2013-12-02 ENCOUNTER — Ambulatory Visit (INDEPENDENT_AMBULATORY_CARE_PROVIDER_SITE_OTHER): Admitting: Internal Medicine

## 2013-12-02 VITALS — BP 134/70 | HR 87 | Temp 97.7°F | Resp 13

## 2013-12-02 DIAGNOSIS — Z9189 Other specified personal risk factors, not elsewhere classified: Secondary | ICD-10-CM

## 2013-12-02 DIAGNOSIS — M2550 Pain in unspecified joint: Secondary | ICD-10-CM

## 2013-12-02 LAB — SEDIMENTATION RATE: SED RATE: 37 mm/h — AB (ref 0–22)

## 2013-12-02 MED ORDER — TRAMADOL HCL 50 MG PO TABS: 50.0000 mg | ORAL_TABLET | Freq: Four times a day (QID) | ORAL | Status: DC | PRN

## 2013-12-02 NOTE — Patient Instructions (Signed)
Your next office appointment will be determined based upon review of your pending labs . Those instructions will be transmitted to you through My Chart .  Followup as needed for your acute issue. Please report any significant change in your symptoms. 

## 2013-12-02 NOTE — Progress Notes (Signed)
Pre visit review using our clinic review tool, if applicable. No additional management support is needed unless otherwise documented below in the visit note. 

## 2013-12-02 NOTE — Progress Notes (Signed)
   Subjective:    Patient ID: Katie Higgins, female    DOB: 1950-05-25, 63 y.o.   MRN: [id number]  HPI    She is here for refill of her tramadol which she takes for arthritis in her shoulders and knees mainly. She also has arthritis of the hips.  There is no history of polymyalgia rheumatica; there is no sedimentation rate in Epic.    Review of Systems  She also takes cyclobenzaprine 10 mg at bedtime on occasion as well as Benadryl 25 mg every 4 hours as needed. She previously was on amitriptyline 25 mg at bedtime but this causes restless legs.  She is followed by the endocrinologist for Graves' disease and is on generic Tapazole  She has past history of congestive heart failure. Cardiologist has her on lovastatin 10 mg daily. Her last liver function tests were 01/20/13. Liver function was normal except for albumin of 3.4.  Her most recent lipid were 06/12/13. At that time LDL was 53 and HDL 49.  Her renal function has varied from a high of 1.6 in January of this year to low of 1.4 on 06/12/13.      Objective:   Physical Exam   Positive or pertinent findings include: She is in a wheelchair Some malocclusion and need of dental repair is present. There is marked central weight excess. Trace edema is noted at the ankles.   General appearance :adequately nourished; in no distress. Eyes: No conjunctival inflammation or scleral icterus is present. Oral exam: Lips and gums are healthy appearing.There is no oropharyngeal erythema or exudate noted.  Heart:  Normal rate and regular rhythm. S1 and S2 normal without gallop, murmur, click, rub or other extra sounds   Lungs:Chest clear to auscultation; no wheezes, rhonchi,rales ,or rubs present.No increased work of breathing.  Abdomen: bowel sounds normal, soft and non-tender without masses, organomegaly or hernias noted.  No guarding or rebound.  Vascular : all pulses equal ; no bruits present. Skin:Warm & dry.  Intact without suspicious  lesions or rashes ; no jaundice or tenting Lymphatic: No lymphadenopathy is noted about the head, neck, axilla             Assessment & Plan:  #1 arthralgias, multiple joints  #2 polypharmacy; risk of falls was discussed    #3 renal insufficiency, mild. This is actually improving.  Plan: Tramadol will be renewed. I have asked her to consider sedimentation rate rule to out polymyalgia rheumatica

## 2013-12-04 ENCOUNTER — Other Ambulatory Visit: Payer: Self-pay

## 2013-12-04 ENCOUNTER — Encounter: Payer: Self-pay | Admitting: Internal Medicine

## 2013-12-04 ENCOUNTER — Encounter: Payer: Self-pay | Admitting: Endocrinology

## 2013-12-04 MED ORDER — BENZONATATE 200 MG PO CAPS: 200.0000 mg | ORAL_CAPSULE | Freq: Three times a day (TID) | ORAL | Status: DC | PRN

## 2013-12-05 ENCOUNTER — Encounter: Payer: Self-pay | Admitting: Internal Medicine

## 2013-12-06 ENCOUNTER — Other Ambulatory Visit: Payer: Self-pay

## 2013-12-06 MED ORDER — GLUCOSE BLOOD VI STRP: ORAL_STRIP | Status: DC

## 2013-12-24 ENCOUNTER — Ambulatory Visit (INDEPENDENT_AMBULATORY_CARE_PROVIDER_SITE_OTHER): Admitting: Family Medicine

## 2013-12-24 ENCOUNTER — Ambulatory Visit (HOSPITAL_COMMUNITY)
Admission: RE | Admit: 2013-12-24 | Discharge: 2013-12-24 | Disposition: A | Discharge status: Home / Self Care | Source: Ambulatory Visit | Attending: Family Medicine | Admitting: Family Medicine

## 2013-12-24 ENCOUNTER — Encounter: Payer: Self-pay | Admitting: Family Medicine

## 2013-12-24 VITALS — BP 118/70 | HR 88

## 2013-12-24 DIAGNOSIS — M25561 Pain in right knee: Secondary | ICD-10-CM | POA: Insufficient documentation

## 2013-12-24 DIAGNOSIS — M5416 Radiculopathy, lumbar region: Secondary | ICD-10-CM

## 2013-12-24 DIAGNOSIS — Z23 Encounter for immunization: Secondary | ICD-10-CM

## 2013-12-24 MED ORDER — GABAPENTIN 100 MG PO CAPS: 100.0000 mg | ORAL_CAPSULE | Freq: Every day | ORAL | Status: DC

## 2013-12-24 MED ORDER — KETOROLAC TROMETHAMINE 60 MG/2ML IM SOLN
60.0000 mg | Freq: Once | INTRAMUSCULAR | Status: AC
  Administered 2013-12-24: 60 mg via INTRAMUSCULAR

## 2013-12-24 MED ORDER — PREDNISONE 50 MG PO TABS: 50.0000 mg | ORAL_TABLET | Freq: Every day | ORAL | Status: DC

## 2013-12-24 NOTE — Progress Notes (Signed)
[facility] [place] Katie Higgins, [place] [address] Phone: [phone] Subjective:     CC: Arthritis pain and multiple joint, exacerbation of leg pain  GEX:Katie Higgins is a 63 y.o. female coming in with complaint of  Pain in multiple joints. Patient was seen previously for end-stage right knee osteoarthritis status post Synvisc. Patient states 3 days ago she was walking and felt a discomfort on the lateral aspect of her knee. Patient states that now it seems to be isolated mostly in her calf and does have radiation all the way to her back. Patient has had back pain previously.last x-rays were back in 2012 and showed some mild to moderate osteoarthritic changes. These were reviewed by me. Patient states that this is more of a burning sensation that seems to come and go. Worse with weightbearing. Patient has been trying tramadol with minimal benefit. 9 out of 10 in severity.patient states that the pain is waking her up at night.patient states that it is keeping her from walking on a regular basis as well. Patient states it is more of pain and weakness though.    Past medical history, social, surgical and family history all reviewed in electronic medical record.   Review of Systems: No headache, visual changes, nausea, vomiting, diarrhea, constipation, dizziness, abdominal pain, skin rash, fevers, chills, night sweats, weight loss, swollen lymph nodes, body aches, joint swelling, muscle aches, chest pain, shortness of breath, mood changes.   Objective Blood pressure 118/70, pulse 88, SpO2 95 %.  General: No apparent distress alert and oriented x3 mood and affect normal, dressed appropriately.  Obese.  HEENT: Pupils equal, extraocular movements intact  Respiratory: Patient's speak in full sentences and does not appear short of breath  Cardiovascular: No lower extremity edema, non tender, no erythema  Skin: Warm dry intact with no signs of infection or  rash on extremities or on axial skeleton.  Abdomen: Soft nontender  Neuro: Cranial nerves II through XII are intact, neurovascularly intact in all extremities with 2+ DTRs and 2+ pulses.  Lymph: No lymphadenopathy of posterior or anterior cervical chain or axillae bilaterally.  Gait normal with good balance and coordination.  MSK:  Non tender with full range of motion and good stability and symmetric strength and tone of shoulders, elbows, wrist, hip, knee and ankles bilaterally. Patient is not cooperating with exam very well today. Patient states that she is in sports even light sensation. Denies numbness or tingling. Neurovascularly intact in all extremities. Back Exam:  Inspection: Unremarkable  Motion: Flexion 35 deg, Extension 25 deg, Side Bending to 25 deg bilaterally,  Rotation to 25 deg bilaterally  SLR laying: positive right side XSLR laying: Negative  Palpable tenderness: moderate tenderness to palpation over the medial as well as lateral joint lines of the knee as well as up the leg. Patient's pain is out of proportion for the sensitivity on exam today. FABER: negative. Sensory change: Gross sensation intact to all lumbar and sacral dermatomes.  Reflexes: 2+ at both patellar tendons, 2+ at achilles tendons, Babinski's downgoing.  Strength at foot  Plantar-flexion: 5/5 Dorsi-flexion: 5/5 Eversion: 5/5 Inversion: 5/5  Leg strength  Quad: 5/5 Hamstring: 5/5 Hip flexor: 5/5 Hip abductors: 5/5  Gait unremarkable.  Knee: Right  On inspection patient does have osteo arthritic changes  Palpation patient does have tenderness over the lateral joint line and mild of the medial joint line.[name]  ROM is decreased in flexion to 95.  Ligaments with solid  consistent endpoints including ACL, PCL, LCL, MCL.  Negative Mcmurray's, Apley's, and Thessalonian tests.  Positive painful patellar compression.  Patellar glide without crepitus.  Patellar and quadriceps tendons unremarkable.  Hamstring and  quadriceps strength is normal.  Contralateral side is positive for osteoporotic changes and tenderness on the medial and lateral joint line but full range of motion.    Impression and Recommendations:     This case required medical decision making of moderate complexity.

## 2013-12-24 NOTE — Assessment & Plan Note (Signed)
Patient does have a positive straight leg test. We will get x-rays to further evaluate. Patient's pain is somewhat out of proportion for light sensitivity to touch. No rash noted. Patient will be started on gabapentin in case this is helpful. Patient was fairly adamant that she needed pain medication but told her that this would not be prescribed today. Patient then is going to come back in 2 weeks for further evaluation.

## 2013-12-24 NOTE — Assessment & Plan Note (Signed)
Patient has had popliteus tendinitis previously. Patient though is now having radiation TO her back. Patient does have a positive straight leg test today. We discussed that this could be her back overall that could be contributing to the pain. X-rays of patient's knee as well as back orders today to see if there is any significant gross changes. We discussed icing protocol, prednisone daily for 5 days, home exercise program, and increasing patient's tramadol short amount of time. Patient discussed the possibility of pain medication and I declined. Patient will come back in 2 weeks for further evaluation. If continuing to have difficulty we may need to consider formal physical therapy. If any weakness occurs we will consider advanced imaging.patient's pain was out of proportion even to light touch which makes me concerned patient may be traumatizing some of the situation. We will monitor closely. Gabapentin 100 mg started daily in case that this is lumbar radiculopathy.  Spent greater than 25 minutes with patient face-to-face and had greater than 50% of counseling including as described above in assessment and plan.

## 2013-12-24 NOTE — Patient Instructions (Addendum)
Good to see you Ice 20 minutes 2 times daily. Usually after activity and before bed. We did an injection today to help with the pain Would like xrays of your knee and back today  Prednisone daily for 5 days then as needed Gabapentin at night  You can double up on tramadol if in severe pain.  See me again in 2 weeks.

## 2014-01-02 ENCOUNTER — Other Ambulatory Visit: Payer: Self-pay | Admitting: Internal Medicine

## 2014-01-05 ENCOUNTER — Other Ambulatory Visit: Payer: Self-pay

## 2014-01-05 MED ORDER — ONDANSETRON HCL 8 MG PO TABS: 8.0000 mg | ORAL_TABLET | Freq: Three times a day (TID) | ORAL | Status: DC | PRN

## 2014-01-12 ENCOUNTER — Other Ambulatory Visit: Payer: Self-pay

## 2014-01-12 MED ORDER — PROMETHAZINE-DM 6.25-15 MG/5ML PO SYRP: 5.0000 mL | ORAL_SOLUTION | Freq: Four times a day (QID) | ORAL | Status: DC | PRN

## 2014-01-12 MED ORDER — ONDANSETRON HCL 8 MG PO TABS: 8.0000 mg | ORAL_TABLET | Freq: Three times a day (TID) | ORAL | Status: DC | PRN

## 2014-01-14 ENCOUNTER — Ambulatory Visit (INDEPENDENT_AMBULATORY_CARE_PROVIDER_SITE_OTHER): Admitting: Endocrinology

## 2014-01-14 ENCOUNTER — Encounter: Payer: Self-pay | Admitting: Endocrinology

## 2014-01-14 ENCOUNTER — Ambulatory Visit
Admission: RE | Admit: 2014-01-14 | Discharge: 2014-01-14 | Disposition: A | Discharge status: Home / Self Care | Source: Ambulatory Visit | Attending: Endocrinology | Admitting: Endocrinology

## 2014-01-14 VITALS — BP 128/78 | HR 89 | Temp 98.3°F | Ht 62.0 in | Wt 285.0 lb

## 2014-01-14 DIAGNOSIS — R509 Fever, unspecified: Secondary | ICD-10-CM

## 2014-01-14 LAB — T4, FREE: FREE T4: 0.88 ng/dL (ref 0.60–1.60)

## 2014-01-14 LAB — CBC WITH DIFFERENTIAL/PLATELET
Basophils Absolute: 0.1 10*3/uL (ref 0.0–0.1)
Basophils Relative: 0.9 % (ref 0.0–3.0)
EOS ABS: 0.4 10*3/uL (ref 0.0–0.7)
Eosinophils Relative: 5.7 % — ABNORMAL HIGH (ref 0.0–5.0)
HCT: 33.5 % — ABNORMAL LOW (ref 36.0–46.0)
Hemoglobin: 10.9 g/dL — ABNORMAL LOW (ref 12.0–15.0)
LYMPHS PCT: 21.5 % (ref 12.0–46.0)
Lymphs Abs: 1.7 10*3/uL (ref 0.7–4.0)
MCHC: 32.4 g/dL (ref 30.0–36.0)
MCV: 81.7 fl (ref 78.0–100.0)
MONO ABS: 1 10*3/uL (ref 0.1–1.0)
Monocytes Relative: 13.3 % — ABNORMAL HIGH (ref 3.0–12.0)
Neutro Abs: 4.6 10*3/uL (ref 1.4–7.7)
Neutrophils Relative %: 58.6 % (ref 43.0–77.0)
PLATELETS: 265 10*3/uL (ref 150.0–400.0)
RBC: 4.11 Mil/uL (ref 3.87–5.11)
RDW: 16.1 % — ABNORMAL HIGH (ref 11.5–15.5)
WBC: 7.9 10*3/uL (ref 4.0–10.5)

## 2014-01-14 LAB — TSH: TSH: 5.14 u[IU]/mL — ABNORMAL HIGH (ref 0.35–4.50)

## 2014-01-14 MED ORDER — METHIMAZOLE 5 MG PO TABS: 5.0000 mg | ORAL_TABLET | Freq: Every day | ORAL | Status: DC

## 2014-01-14 MED ORDER — CEFUROXIME AXETIL 250 MG PO TABS: 250.0000 mg | ORAL_TABLET | Freq: Two times a day (BID) | ORAL | Status: AC

## 2014-01-14 NOTE — Patient Instructions (Addendum)
blood tests, and a chest-x-ray are being requested for you today.  We'll contact you with results. i have sent a prescription to your pharmacy, for an antibiotic.  I hope you feel better soon.  If you don't feel better by next week, please call Dr Asa Lente.  Please call sooner if you get worse. Please come back for a follow-up appointment in 6 months.  if ever you have fever while taking methimazole, stop it and call us, because of the risk of a rare side-effect.   Loratadine-d (non-prescription) will help your congestion.

## 2014-01-14 NOTE — Progress Notes (Signed)
Subjective:    Patient ID: Katie Higgins, female    DOB: Aug 20, 1950, 63 y.o.   MRN: [id number]  HPI Pt returns for f/u of grave's dz (dx'ed 2010; US showed heterogeneity of texture and one small nodule; she has been unable to d/c tapazole for i-131 rx, due to CHF).  pt states she feels well in general.  Pt states 1 week of slight dry-quality cough in the chest, and assoc intermittent fever.   Past Medical History  Diagnosis Date  . Palpitations   . Obesity   . CARDIOMYOPATHY     Nonischemic. Admitted in 3/11 with CHF exac, due to hyperthyroidism. LHC (03/11) showed clean coronaries with EF 40%. Myoview showed EF 38%. Echo was a technically difficult study with mild to moderately decreased EF, mild LVH, mild MR. Repeat echo (9/11) with EF 01-77%, mild diastolic dysfunction (grade I).  . Congestive heart failure, unspecified   . Osteoarthritis     esp L hip, low back  . ALLERGIC RHINITIS   . DIABETES MELLITUS, CONTROLLED   . DYSLIPIDEMIA   . HYPERTENSION   . HYPERTHYROIDISM   . CARPAL TUNNEL SYNDROME, LEFT, MILD   . Graves disease     Past Surgical History  Procedure Laterality Date  . Cholecystectomy    . Remote hernia repair    . Cardiac catheterization  04/19/09    History   Social History  . Marital Status: Widowed    Spouse Name: N/A    Number of Children: N/A  . Years of Education: N/A   Occupational History  . Not on file.   Social History Main Topics  . Smoking status: Never Smoker   . Smokeless tobacco: Not on file  . Alcohol Use: No  . Drug Use: No  . Sexual Activity: Not on file   Other Topics Concern  . Not on file   Social History Narrative   Lives in [place] with her son.   Widowed since 1992.   She is very sedentary, uses a wheelchair when out of the house.   Ambulatory in home with cane.    Current Outpatient Prescriptions on File Prior to Visit  Medication Sig Dispense Refill  . aspirin 81 MG tablet Take 81 mg by mouth daily.      .  benzonatate (TESSALON) 200 MG capsule Take 1 capsule (200 mg total) by mouth 3 (three) times daily as needed for cough. 15 capsule 0  . carvedilol (COREG) 25 MG tablet Take 1 tablet (25 mg total) by mouth 2 (two) times daily with a meal. 180 tablet 3  . cyclobenzaprine (FLEXERIL) 10 MG tablet Take 1 tablet (10 mg total) by mouth every 8 (eight) hours as needed for muscle spasms. 90 tablet 1  . diphenhydrAMINE (BENADRYL) 25 mg capsule Take 25 mg by mouth every 4 (four) hours as needed. For allergies     . enalapril (VASOTEC) 10 MG tablet Take 1 tablet (10 mg total) by mouth daily. 90 tablet 3  . furosemide (LASIX) 20 MG tablet Take 1 tablet (20 mg total) by mouth daily. 90 tablet 3  . gabapentin (NEURONTIN) 100 MG capsule Take 1 capsule (100 mg total) by mouth at bedtime. 30 capsule 1  . glucose blood test strip Use as instructed with glucometer to test blood sugar at least twice a day. 100 each 12  . glucose monitoring kit (FREESTYLE) monitoring kit Any glucometer and supplies (strips and lancets) Dx 250.00 1 each 0  . lovastatin (MEVACOR)  10 MG tablet Take 1 tablet (10 mg total) by mouth daily. 90 tablet 3  . meloxicam (MOBIC) 15 MG tablet Take 1 tablet (15 mg total) by mouth daily. 90 tablet 1  . Multiple Vitamin (MULTIVITAMIN) tablet Take 1 tablet by mouth daily.      . ondansetron (ZOFRAN) 8 MG tablet Take 1 tablet (8 mg total) by mouth every 8 (eight) hours as needed for nausea or vomiting. 90 tablet 0  . predniSONE (DELTASONE) 50 MG tablet Take 1 tablet (50 mg total) by mouth daily. 5 tablet 0  . promethazine-dextromethorphan (PROMETHAZINE-DM) 6.25-15 MG/5ML syrup Take 5 mLs by mouth 4 (four) times daily as needed for cough. 120 mL 0  . traMADol (ULTRAM) 50 MG tablet Take 1 tablet (50 mg total) by mouth every 6 (six) hours as needed. 180 tablet 0   No current facility-administered medications on file prior to visit.    No Known Allergies  Family History  Problem Relation Age of Onset    . Diabetes Mother   . Breast cancer Mother   . Kidney cancer Father     Kidney Cancer  . Stroke Father     CVA  . Cervical cancer Mother   . Supraventricular tachycardia Sister   . Asthma      family history    BP 128/78 mmHg  Pulse 89  Temp(Src) 98.3 F (36.8 C) (Oral)  Ht 5' 2" (1.575 m)  Wt 285 lb (129.275 kg)  BMI 52.11 kg/m2  SpO2 98%    Review of Systems Denies sob.  She has had sore throat x 4 days.  She also has nasal congestion.     Objective:   Physical Exam VITAL SIGNS:  See vs page GENERAL: no distress.  Morbid obesity.  In wheelchair.  head: no deformity eyes: no periorbital swelling, no proptosis external nose and ears are normal mouth: no lesion seen Both eac's and tm's are normal NECK: There is no palpable thyroid enlargement.  No thyroid nodule is palpable.  No palpable lymphadenopathy at the anterior neck. LUNGS:  Clear to auscultation  Lab Results  Component Value Date   TSH 5.14* 01/14/2014       Assessment & Plan:  Hyperthyroidism, slightly overcontrolled.  Please reduce the methimazole to 10 mg bid. URI: new.  Patient is advised the following: Patient Instructions  blood tests, and a chest-x-ray are being requested for you today.  We'll contact you with results. i have sent a prescription to your pharmacy, for an antibiotic.  I hope you feel better soon.  If you don't feel better by next week, please call Dr Asa Lente.  Please call sooner if you get worse. Please come back for a follow-up appointment in 6 months.  if ever you have fever while taking methimazole, stop it and call us, because of the risk of a rare side-effect.   Loratadine-d (non-prescription) will help your congestion.

## 2014-01-15 ENCOUNTER — Encounter: Payer: Self-pay | Admitting: Internal Medicine

## 2014-01-17 ENCOUNTER — Encounter: Payer: Self-pay | Admitting: Internal Medicine

## 2014-01-19 NOTE — Telephone Encounter (Signed)
OV with me or anyone for anemia workup

## 2014-02-04 ENCOUNTER — Ambulatory Visit (INDEPENDENT_AMBULATORY_CARE_PROVIDER_SITE_OTHER): Admitting: Family Medicine

## 2014-02-04 ENCOUNTER — Encounter: Payer: Self-pay | Admitting: Family Medicine

## 2014-02-04 VITALS — BP 128/80 | HR 87

## 2014-02-04 DIAGNOSIS — M67431 Ganglion, right wrist: Secondary | ICD-10-CM

## 2014-02-04 DIAGNOSIS — M2550 Pain in unspecified joint: Secondary | ICD-10-CM

## 2014-02-04 DIAGNOSIS — Z23 Encounter for immunization: Secondary | ICD-10-CM

## 2014-02-04 DIAGNOSIS — G8929 Other chronic pain: Secondary | ICD-10-CM | POA: Insufficient documentation

## 2014-02-04 DIAGNOSIS — M67439 Ganglion, unspecified wrist: Secondary | ICD-10-CM | POA: Insufficient documentation

## 2014-02-04 DIAGNOSIS — R52 Pain, unspecified: Secondary | ICD-10-CM

## 2014-02-04 MED ORDER — TRAMADOL HCL 50 MG PO TABS: 50.0000 mg | ORAL_TABLET | Freq: Four times a day (QID) | ORAL | Status: DC | PRN

## 2014-02-04 MED ORDER — CYCLOBENZAPRINE HCL 10 MG PO TABS: 10.0000 mg | ORAL_TABLET | Freq: Three times a day (TID) | ORAL | Status: DC | PRN

## 2014-02-04 MED ORDER — COLCHICINE 0.6 MG PO TABS: 0.6000 mg | ORAL_TABLET | Freq: Every day | ORAL | Status: DC

## 2014-02-04 MED ORDER — NONFORMULARY OR COMPOUNDED ITEM: 1.0000 "application " | Freq: Two times a day (BID) | Status: DC

## 2014-02-04 NOTE — Assessment & Plan Note (Signed)
Patient does have generalized pain is likely multifactorial with other patient's comorbidities including her obesity. Patient does have degenerative disc and degenerative joint disease of multiple joints. Discussed the importance of weight loss, home exercises and icing protocol. Patient was given refill of medications today. We discussed to start avoiding the tramadol if possible. Patient will try to titrate down.

## 2014-02-04 NOTE — Patient Instructions (Signed)
Good to see you Ganglion cyst.   Lets put you in a brace day and night for 1 week then nightly for 2 weeks to decrease the size.  Made changes to your medicines.  Refilled flexeril and will try colchicine daily.  Stop the gababpentin and the meloxicam if it is not helping.  If pain gets worse off the meloxicam then we know we need an antiinflammatory and may try celebrex.  Ice is your friend.  See me again in 3 weeks or if ganglion cyst gets bigger then we can try to drain.

## 2014-02-04 NOTE — Assessment & Plan Note (Signed)
Discussed with patient due to the location would like to monitor. We discussed icing regimen, home exercises, and patient was put in a brace today. This was fitted by me. Patient will wear it day and night for one week and then nightly for 2 weeks to see if we can decrease any inflammation that could be contribute in. Patient will come back in 3 weeks. At that time if continuing to have difficulty or swelling we can consider aspiration. We will do this under ultrasound guidance due to location.  Spent greater than 25 minutes with patient face-to-face and had greater than 50% of counseling including as described above in assessment and plan.

## 2014-02-04 NOTE — Progress Notes (Signed)
  [facility] [place], [place] [address] Phone: [phone] Subjective:     CC: New right wrist pain and mass.  ONG:[id number] Katie Higgins is a 63 y.o. female coming in with complaint of multiple joint pains. Patient does need a refill of her Flexeril it seems to be beneficial. Patient has not notice any significant and affect from anti-inflammatories but is wondering if colchicine could be helpful with a strong family history of uric acid arthropathy. Patient is looking for any type of relief and is wondering if this will be helpful. Patient continues to take tramadol fairly regularly.   's problems no is patient has noticed a mass on her right right wrist. This can be tender and sometimes give her numbness in the thumb and index finger. States that it seems to get larger and smaller over the course of time. Patient does not remember any injury. Has had it for multiple years but seems to be worsening.    Past medical history, social, surgical and family history all reviewed in electronic medical record.   Review of Systems: No headache, visual changes, nausea, vomiting, diarrhea, constipation, dizziness, abdominal pain, skin rash, fevers, chills, night sweats, weight loss, swollen lymph nodes, body aches, joint swelling, muscle aches, chest pain, shortness of breath, mood changes.   Objective Blood pressure 128/80, pulse 87, SpO2 97 %.  General: No apparent distress alert and oriented x3 mood and affect normal, dressed appropriately.  Obese.  HEENT: Pupils equal, extraocular movements intact  Respiratory: Patient's speak in full sentences and does not appear short of breath  Cardiovascular: No lower extremity edema, non tender, no erythema  Skin: Warm dry intact with no signs of infection or rash on extremities or on axial skeleton.  Abdomen: Soft nontender  Neuro: Cranial nerves II through XII are intact, neurovascularly intact in all  extremities with 2+ DTRs and 2+ pulses.  Lymph: No lymphadenopathy of posterior or anterior cervical chain or axillae bilaterally.  Gait normal with good balance and coordination.  MSK:  Non tender with full range of motion and good stability and symmetric strength and tone of shoulders, elbows,  hip, knee and ankles bilaterally. Patient is not cooperating with exam very well today. Patient states that she is tender even light sensation. Denies numbness or tingling. Neurovascularly intact in all extremities. Pain seems to be out of proportion to physical findings. Wrist: Right Ganglion cyst noted on the palmar aspect or the radial artery.[name] ROM smooth and normal with good flexion and extension and ulnar/radial deviation that is symmetrical with opposite wrist. Palpation is normal over metacarpals, navicular, lunate, and TFCC; tendons without tenderness/ swelling No snuffbox tenderness. No tenderness over Canal of [place]. Strength 5/5 in all directions without pain. Negative Finkelstein, tinel's and phalens. Negative Watson's test. Contralateral wrist unremarkable      Impression and Recommendations:     This case required medical decision making of moderate complexity.

## 2014-02-05 MED ORDER — COLCHICINE 0.6 MG PO TABS: 0.6000 mg | ORAL_TABLET | Freq: Every day | ORAL | Status: DC

## 2014-02-05 NOTE — Progress Notes (Signed)
Resent for #90 to [facility][name]

## 2014-02-05 NOTE — Addendum Note (Signed)
Addended by: Earnstine Regal on: 02/05/2014 09:54 AM   Modules accepted: Orders

## 2014-02-09 ENCOUNTER — Encounter: Payer: Self-pay | Admitting: Internal Medicine

## 2014-02-11 ENCOUNTER — Other Ambulatory Visit: Payer: Self-pay

## 2014-02-11 MED ORDER — PROMETHAZINE-DM 6.25-15 MG/5ML PO SYRP: 5.0000 mL | ORAL_SOLUTION | Freq: Four times a day (QID) | ORAL | Status: DC | PRN

## 2014-02-20 ENCOUNTER — Other Ambulatory Visit: Payer: Self-pay | Admitting: Internal Medicine

## 2014-03-26 ENCOUNTER — Encounter: Payer: Self-pay | Admitting: Internal Medicine

## 2014-03-26 ENCOUNTER — Other Ambulatory Visit: Payer: Self-pay

## 2014-03-26 DIAGNOSIS — M2550 Pain in unspecified joint: Secondary | ICD-10-CM

## 2014-03-26 MED ORDER — TRAMADOL HCL 50 MG PO TABS: 50.0000 mg | ORAL_TABLET | Freq: Four times a day (QID) | ORAL | Status: DC | PRN

## 2014-03-26 NOTE — Telephone Encounter (Signed)
rx faxed

## 2014-04-02 ENCOUNTER — Encounter: Payer: Self-pay | Admitting: Internal Medicine

## 2014-04-09 ENCOUNTER — Other Ambulatory Visit: Payer: Self-pay

## 2014-04-09 ENCOUNTER — Other Ambulatory Visit: Payer: Self-pay | Admitting: Internal Medicine

## 2014-04-09 MED ORDER — ONDANSETRON HCL 8 MG PO TABS: 8.0000 mg | ORAL_TABLET | Freq: Three times a day (TID) | ORAL | Status: DC | PRN

## 2014-04-30 ENCOUNTER — Ambulatory Visit: Admitting: Family Medicine

## 2014-05-06 ENCOUNTER — Ambulatory Visit: Admitting: Internal Medicine

## 2014-05-16 ENCOUNTER — Telehealth: Payer: Self-pay | Admitting: Internal Medicine

## 2014-05-18 ENCOUNTER — Encounter: Payer: Self-pay | Admitting: Internal Medicine

## 2014-05-18 MED ORDER — PROMETHAZINE-DM 6.25-15 MG/5ML PO SYRP: 5.0000 mL | ORAL_SOLUTION | Freq: Four times a day (QID) | ORAL | Status: DC | PRN

## 2014-05-18 NOTE — Telephone Encounter (Signed)
Sent in refill

## 2014-05-21 ENCOUNTER — Ambulatory Visit (INDEPENDENT_AMBULATORY_CARE_PROVIDER_SITE_OTHER): Admitting: Family Medicine

## 2014-05-21 ENCOUNTER — Encounter: Payer: Self-pay | Admitting: Family Medicine

## 2014-05-21 VITALS — BP 106/62 | HR 85 | Ht 62.0 in | Wt 285.0 lb

## 2014-05-21 DIAGNOSIS — S40021A Contusion of right upper arm, initial encounter: Secondary | ICD-10-CM | POA: Diagnosis not present

## 2014-05-21 DIAGNOSIS — M129 Arthropathy, unspecified: Secondary | ICD-10-CM | POA: Diagnosis not present

## 2014-05-21 DIAGNOSIS — S40029A Contusion of unspecified upper arm, initial encounter: Secondary | ICD-10-CM | POA: Insufficient documentation

## 2014-05-21 DIAGNOSIS — M1712 Unilateral primary osteoarthritis, left knee: Secondary | ICD-10-CM

## 2014-05-21 NOTE — Assessment & Plan Note (Signed)
Patient was given an injection today with near complete resolution of pain immediately. We discussed icing and home exercises. Patient given topical anti-inflammatory to try we discussed the possibility of bracing which patient declined. Patient will come back again in 3-4 weeks. She could be a candidate for viscous supplementation.  Spent  25 minutes with patient face-to-face and had greater than 50% of counseling including as described above in assessment and plan.

## 2014-05-21 NOTE — Assessment & Plan Note (Signed)
Arm contusion noted. Patient is doing better and we discussed topical medicines a can decrease the bruising. Discussed that this will take 3 weeks to fully resolve. No findings of radicular symptoms. Patient come back in 3 weeks

## 2014-05-21 NOTE — Patient Instructions (Signed)
Good to see you Try pennsaid up to 2 times daily where ever you hurt Continue the colchicine when you need it.  We tried an injection to the left knee.  Arnica lotion for the right arm, this will take some time.  Try bracing the thumb at night See me again in 4 weeks.

## 2014-05-21 NOTE — Progress Notes (Signed)
[name] [place], [place] [address] Phone: [phone] Subjective:     CC: Right arm pain and left knee pain  HRC:[id number] Katie Higgins is a 64 y.o. female coming in with complaint of multiple joint pains.  Patient is having right arm pain. 3 weeks ago patient while in her wheelchair did get her arm stuck between the doorway in her wheelchair. Patient had significant swelling and bruising immediately. Patient states it has improved over the course of time but still somewhat sore. Patient states that the bruising is improving as well. No radiation of pain or any numbness or tingling. Patient is concerned because the bruising still rare 3 weeks later.  Patient is complaining more of left knee pain. Patient does have known osteophytic changes of this knee. Patient has difficulty with ambulating on the time but recently has had more. More medial joint line. Patient states that it hurts more with increasing activity. Patient states sometimes it feels unstable. Rates the severity of pain a 8 out of 10.       Past medical history, social, surgical and family history all reviewed in electronic medical record.   Review of Systems: No headache, visual changes, nausea, vomiting, diarrhea, constipation, dizziness, abdominal pain, skin rash, fevers, chills, night sweats, weight loss, swollen lymph nodes, body aches, joint swelling, muscle aches, chest pain, shortness of breath, mood changes.   Objective Blood pressure 106/62, pulse 85, height 5\' 2"  (1.575 m), weight 285 lb (129.275 kg), SpO2 96 %.  General: No apparent distress alert and oriented x3 mood and affect normal, dressed appropriately.  Obese.  HEENT: Pupils equal, extraocular movements intact  Respiratory: Patient's speak in full sentences and does not appear short of breath  Cardiovascular: No lower extremity edema, non tender, no erythema  Skin: Warm dry intact with no signs of  infection or rash on extremities or on axial skeleton.  Abdomen: Soft nontender  Neuro: Cranial nerves II through XII are intact, neurovascularly intact in all extremities with 2+ DTRs and 2+ pulses.  Lymph: No lymphadenopathy of posterior or anterior cervical chain or axillae bilaterally.  Gait sitting in wheelchair MSK:  Non tender with full range of motion and good stability and symmetric strength and tone of shoulders, elbows,  hip, knee and ankles bilaterally. Patient is not cooperating with exam very well today. Patient states that she is tender even light sensation. Denies numbness or tingling. Neurovascularly intact in all extremities. Pain seems to be out of proportion to physical findings. Patient's right forearm does have some bruising and contusion still noted. Patient has full range of motion of the elbow and wrist and neurovascularly intact with full strength. Knee: Left Normal to inspection with no erythema or effusion or obvious bony abnormalities. Tender to palpation over the medial joint line.[name] ROM full in flexion and extension and lower leg rotation. Ligaments with solid consistent endpoints including ACL, PCL, LCL, MCL. Negative Mcmurray's, Apley's, and Thessalonian tests. Non painful patellar compression. Patellar glide with moderate crepitus. Patellar and quadriceps tendons unremarkable. Hamstring and quadriceps strength is normal.   Procedure note  After informed written and verbal consent, patient was seated on exam table. Left knee was prepped with alcohol swab and utilizing anterolateral approach, patient's left knee space was injected with 4:1  marcaine 0.5%: Kenalog 40mg /dL. Patient tolerated the procedure well without immediate complications.      Impression and Recommendations:     This case required medical  decision making of moderate complexity.

## 2014-05-21 NOTE — Progress Notes (Signed)
Pre visit review using our clinic review tool, if applicable. No additional management support is needed unless otherwise documented below in the visit note. 

## 2014-05-27 ENCOUNTER — Encounter: Payer: Self-pay | Admitting: Family Medicine

## 2014-06-04 ENCOUNTER — Other Ambulatory Visit: Payer: Self-pay | Admitting: Internal Medicine

## 2014-06-11 ENCOUNTER — Encounter: Payer: Self-pay | Admitting: Family Medicine

## 2014-06-11 DIAGNOSIS — M2550 Pain in unspecified joint: Secondary | ICD-10-CM

## 2014-06-11 MED ORDER — TRAMADOL HCL 50 MG PO TABS: 50.0000 mg | ORAL_TABLET | Freq: Four times a day (QID) | ORAL | Status: DC | PRN

## 2014-06-11 MED ORDER — CYCLOBENZAPRINE HCL 10 MG PO TABS: 10.0000 mg | ORAL_TABLET | Freq: Three times a day (TID) | ORAL | Status: DC | PRN

## 2014-06-11 MED ORDER — MELOXICAM 15 MG PO TABS: 15.0000 mg | ORAL_TABLET | Freq: Every day | ORAL | Status: DC

## 2014-06-12 ENCOUNTER — Ambulatory Visit: Admitting: Cardiology

## 2014-06-17 ENCOUNTER — Ambulatory Visit: Payer: Self-pay | Admitting: Internal Medicine

## 2014-07-01 ENCOUNTER — Encounter: Payer: Self-pay | Admitting: Endocrinology

## 2014-07-01 ENCOUNTER — Ambulatory Visit (INDEPENDENT_AMBULATORY_CARE_PROVIDER_SITE_OTHER): Admitting: Endocrinology

## 2014-07-01 VITALS — BP 118/70 | HR 75 | Temp 98.2°F | Ht 62.0 in | Wt 268.0 lb

## 2014-07-01 DIAGNOSIS — E0580 Other thyrotoxicosis without thyrotoxic crisis or storm: Secondary | ICD-10-CM | POA: Diagnosis not present

## 2014-07-01 LAB — T4, FREE: Free T4: 1.06 ng/dL (ref 0.60–1.60)

## 2014-07-01 LAB — TSH: TSH: 3.13 u[IU]/mL (ref 0.35–4.50)

## 2014-07-01 MED ORDER — CYCLOBENZAPRINE HCL 10 MG PO TABS: 10.0000 mg | ORAL_TABLET | Freq: Three times a day (TID) | ORAL | Status: DC | PRN

## 2014-07-01 NOTE — Patient Instructions (Signed)
blood tests are requested for you today.  We'll let you know about the results.  Please come back for a follow-up appointment in 6 months.  if ever you have fever while taking methimazole, stop it and call us, because of the risk of a rare side-effect.    

## 2014-07-01 NOTE — Addendum Note (Signed)
Addended by: Douglass Rivers T on: 07/01/2014 04:54 PM   Modules accepted: Orders

## 2014-07-01 NOTE — Progress Notes (Signed)
Subjective:    Patient ID: Katie Higgins, female    DOB: 11/11/1950, 64 y.o.   MRN: [id number]  HPI Pt returns for f/u of grave's dz (dx'ed 2010; US showed heterogeneity of texture and one small nodule; she has been unable to d/c tapazole for i-131 rx, due to CHF).  pt states she feels well in general, except for irregular sleep and depression.   Past Medical History  Diagnosis Date  . Palpitations   . Obesity   . CARDIOMYOPATHY     Nonischemic. Admitted in 3/11 with CHF exac, due to hyperthyroidism. LHC (03/11) showed clean coronaries with EF 40%. Myoview showed EF 38%. Echo was a technically difficult study with mild to moderately decreased EF, mild LVH, mild MR. Repeat echo (9/11) with EF 44-03%, mild diastolic dysfunction (grade I).  . Congestive heart failure, unspecified   . Osteoarthritis     esp L hip, low back  . ALLERGIC RHINITIS   . DIABETES MELLITUS, CONTROLLED   . DYSLIPIDEMIA   . HYPERTENSION   . HYPERTHYROIDISM   . CARPAL TUNNEL SYNDROME, LEFT, MILD   . Graves disease     Past Surgical History  Procedure Laterality Date  . Cholecystectomy    . Remote hernia repair    . Cardiac catheterization  04/19/09    History   Social History  . Marital Status: Widowed    Spouse Name: N/A  . Number of Children: N/A  . Years of Education: N/A   Occupational History  . Not on file.   Social History Main Topics  . Smoking status: Never Smoker   . Smokeless tobacco: Not on file  . Alcohol Use: No  . Drug Use: No  . Sexual Activity: Not on file   Other Topics Concern  . Not on file   Social History Narrative   Lives in [place] with her son.   Widowed since 1992.   She is very sedentary, uses a wheelchair when out of the house.   Ambulatory in home with cane.    Current Outpatient Prescriptions on File Prior to Visit  Medication Sig Dispense Refill  . aspirin 81 MG tablet Take 81 mg by mouth daily.      . carvedilol (COREG) 25 MG tablet Take 1 tablet  (25 mg total) by mouth 2 (two) times daily with a meal. 180 tablet 3  . colchicine 0.6 MG tablet Take 1 tablet (0.6 mg total) by mouth daily. 90 tablet 0  . diphenhydrAMINE (BENADRYL) 25 mg capsule Take 25 mg by mouth every 4 (four) hours as needed. For allergies     . enalapril (VASOTEC) 10 MG tablet Take 1 tablet (10 mg total) by mouth daily. 90 tablet 3  . furosemide (LASIX) 20 MG tablet Take 1 tablet (20 mg total) by mouth daily. 90 tablet 3  . glucose blood test strip Use as instructed with glucometer to test blood sugar at least twice a day. 100 each 12  . glucose monitoring kit (FREESTYLE) monitoring kit Any glucometer and supplies (strips and lancets) Dx 250.00 1 each 0  . lovastatin (MEVACOR) 10 MG tablet Take 1 tablet (10 mg total) by mouth daily. 90 tablet 3  . meloxicam (MOBIC) 15 MG tablet Take 1 tablet (15 mg total) by mouth daily. 90 tablet 0  . methimazole (TAPAZOLE) 5 MG tablet Take 1 tablet (5 mg total) by mouth daily. 90 tablet 2  . Multiple Vitamin (MULTIVITAMIN) tablet Take 1 tablet by mouth daily.      [name]  NONFORMULARY OR COMPOUNDED ITEM Apply 1 application topically 2 (two) times daily. 60 each 1  . ondansetron (ZOFRAN) 8 MG tablet Take 1 tablet (8 mg total) by mouth every 8 (eight) hours as needed for nausea or vomiting. 90 tablet 0  . promethazine-dextromethorphan (PROMETHAZINE-DM) 6.25-15 MG/5ML syrup Take 5 mLs by mouth 4 (four) times daily as needed for cough. 120 mL 0  . traMADol (ULTRAM) 50 MG tablet Take 1 tablet (50 mg total) by mouth every 6 (six) hours as needed. 180 tablet 0  . cyclobenzaprine (FLEXERIL) 10 MG tablet Take 1 tablet (10 mg total) by mouth every 8 (eight) hours as needed for muscle spasms. 90 tablet 0   No current facility-administered medications on file prior to visit.    No Known Allergies  Family History  Problem Relation Age of Onset  . Diabetes Mother   . Breast cancer Mother   . Kidney cancer Father     Kidney Cancer  . Stroke Father      CVA  . Cervical cancer Mother   . Supraventricular tachycardia Sister   . Asthma      family history    BP 118/70 mmHg  Pulse 75  Temp(Src) 98.2 F (36.8 C) (Oral)  Ht 5' 2"  (1.575 m)  Wt 268 lb (121.564 kg)  BMI 49.01 kg/m2  SpO2 95%    Review of Systems Denies fever.    Objective:   Physical Exam VITAL SIGNS:  See vs page GENERAL: no distress.  In wheelchair NECK: There is no palpable thyroid enlargement.  No thyroid nodule is palpable.  No palpable lymphadenopathy at the anterior neck.     Lab Results  Component Value Date   TSH 3.13 07/01/2014      Assessment & Plan:  Hyperthyroidism: well-controlled   Patient is advised the following: Patient Instructions  blood tests are requested for you today.  We'll let you know about the results.  Please come back for a follow-up appointment in 6 months.  if ever you have fever while taking methimazole, stop it and call us, because of the risk of a rare side-effect.    addendum: Please continue the same methimazole.

## 2014-07-02 ENCOUNTER — Other Ambulatory Visit: Payer: Self-pay

## 2014-07-02 MED ORDER — METHIMAZOLE 5 MG PO TABS: 5.0000 mg | ORAL_TABLET | Freq: Every day | ORAL | Status: DC

## 2014-07-06 ENCOUNTER — Encounter: Payer: Self-pay | Admitting: Endocrinology

## 2014-07-07 ENCOUNTER — Other Ambulatory Visit: Payer: Self-pay | Admitting: Family Medicine

## 2014-07-08 MED ORDER — CYCLOBENZAPRINE HCL 10 MG PO TABS: 10.0000 mg | ORAL_TABLET | Freq: Three times a day (TID) | ORAL | Status: DC | PRN

## 2014-07-10 MED ORDER — CYCLOBENZAPRINE HCL 10 MG PO TABS: 10.0000 mg | ORAL_TABLET | Freq: Three times a day (TID) | ORAL | Status: DC | PRN

## 2014-07-10 NOTE — Addendum Note (Signed)
Addended by: Lowella Dandy on: 07/10/2014 03:41 PM   Modules accepted: Orders

## 2014-07-13 ENCOUNTER — Encounter: Payer: Self-pay | Admitting: Cardiology

## 2014-07-13 ENCOUNTER — Other Ambulatory Visit: Payer: Self-pay

## 2014-07-13 MED ORDER — ENALAPRIL MALEATE 10 MG PO TABS: 10.0000 mg | ORAL_TABLET | Freq: Every day | ORAL | Status: DC

## 2014-07-13 MED ORDER — LOVASTATIN 10 MG PO TABS: 10.0000 mg | ORAL_TABLET | Freq: Every day | ORAL | Status: DC

## 2014-07-13 MED ORDER — CARVEDILOL 25 MG PO TABS: 25.0000 mg | ORAL_TABLET | Freq: Two times a day (BID) | ORAL | Status: DC

## 2014-07-22 ENCOUNTER — Encounter: Payer: Self-pay | Admitting: Cardiology

## 2014-07-30 ENCOUNTER — Telehealth: Payer: Self-pay

## 2014-07-30 NOTE — Telephone Encounter (Signed)
Left Pt a VM for him to return call and make an appt

## 2014-08-03 ENCOUNTER — Other Ambulatory Visit: Payer: Self-pay

## 2014-08-03 ENCOUNTER — Ambulatory Visit: Admitting: Endocrinology

## 2014-08-04 ENCOUNTER — Telehealth: Payer: Self-pay | Admitting: Emergency Medicine

## 2014-08-04 NOTE — Telephone Encounter (Signed)
Pt would like to est care with [facility]. Please advise.

## 2014-08-04 NOTE — Telephone Encounter (Signed)
-----   Message from Hendricks Limes, MD sent at 08/03/2014  4:06 PM EDT ----- Regarding: FW: Change Doctors She was never my patient; she was seeing Dr Asa Lente ----- Message -----    From: Brunilda Payor    Sent: 08/03/2014   3:22 PM      To: Hendricks Limes, MD, Olga Millers, MD Subject: Change Doctors                                 Patient would like to switch from, [facility] to establish care with [facility], she want to have a female Doctor she stated. Let her know if it is ok. Thank you

## 2014-08-04 NOTE — Telephone Encounter (Signed)
Ok with me to establish with any pcp given my reduced availability Thanks!

## 2014-08-04 NOTE — Telephone Encounter (Signed)
LVM for pt to call back and set up appt to est care with dr Doug Sou.

## 2014-09-08 ENCOUNTER — Encounter: Payer: Self-pay | Admitting: Internal Medicine

## 2014-09-08 ENCOUNTER — Other Ambulatory Visit (INDEPENDENT_AMBULATORY_CARE_PROVIDER_SITE_OTHER): Payer: Medicare Other

## 2014-09-08 ENCOUNTER — Ambulatory Visit (INDEPENDENT_AMBULATORY_CARE_PROVIDER_SITE_OTHER): Payer: Medicare Other | Admitting: Internal Medicine

## 2014-09-08 VITALS — BP 142/72 | HR 94 | Temp 98.2°F | Resp 20 | Ht 62.0 in | Wt 274.1 lb

## 2014-09-08 DIAGNOSIS — M2550 Pain in unspecified joint: Secondary | ICD-10-CM

## 2014-09-08 DIAGNOSIS — H9193 Unspecified hearing loss, bilateral: Secondary | ICD-10-CM

## 2014-09-08 DIAGNOSIS — I10 Essential (primary) hypertension: Secondary | ICD-10-CM

## 2014-09-08 DIAGNOSIS — E118 Type 2 diabetes mellitus with unspecified complications: Secondary | ICD-10-CM | POA: Diagnosis not present

## 2014-09-08 DIAGNOSIS — Z23 Encounter for immunization: Secondary | ICD-10-CM | POA: Diagnosis not present

## 2014-09-08 DIAGNOSIS — N183 Chronic kidney disease, stage 3 unspecified: Secondary | ICD-10-CM

## 2014-09-08 DIAGNOSIS — Z0000 Encounter for general adult medical examination without abnormal findings: Secondary | ICD-10-CM | POA: Diagnosis not present

## 2014-09-08 DIAGNOSIS — R300 Dysuria: Secondary | ICD-10-CM | POA: Diagnosis not present

## 2014-09-08 DIAGNOSIS — Z299 Encounter for prophylactic measures, unspecified: Secondary | ICD-10-CM

## 2014-09-08 LAB — COMPREHENSIVE METABOLIC PANEL
ALBUMIN: 3.9 g/dL (ref 3.5–5.2)
ALT: 9 U/L (ref 0–35)
AST: 10 U/L (ref 0–37)
Alkaline Phosphatase: 56 U/L (ref 39–117)
BILIRUBIN TOTAL: 0.5 mg/dL (ref 0.2–1.2)
BUN: 18 mg/dL (ref 6–23)
CHLORIDE: 104 meq/L (ref 96–112)
CO2: 29 mEq/L (ref 19–32)
Calcium: 9.1 mg/dL (ref 8.4–10.5)
Creatinine, Ser: 1.27 mg/dL — ABNORMAL HIGH (ref 0.40–1.20)
GFR: 44.89 mL/min — ABNORMAL LOW (ref >60.00)
Glucose, Bld: 112 mg/dL — ABNORMAL HIGH (ref 70–99)
POTASSIUM: 5 meq/L (ref 3.5–5.1)
SODIUM: 139 meq/L (ref 135–145)
Total Protein: 7.3 g/dL (ref 6.0–8.3)

## 2014-09-08 LAB — HEMOGLOBIN A1C: Hgb A1c MFr Bld: 5.4 % (ref 4.6–6.5)

## 2014-09-08 LAB — LIPID PANEL
CHOL/HDL RATIO: 2
CHOLESTEROL: 124 mg/dL (ref 0–200)
HDL: 53.5 mg/dL (ref >39.00)
LDL CALC: 52 mg/dL (ref 0–99)
NonHDL: 70.46
Triglycerides: 92 mg/dL (ref 0.0–149.0)
VLDL: 18.4 mg/dL (ref 0.0–40.0)

## 2014-09-08 LAB — POCT URINALYSIS DIPSTICK
Bilirubin, UA: NEGATIVE
Blood, UA: NEGATIVE
Glucose, UA: NEGATIVE
Ketones, UA: NEGATIVE
LEUKOCYTES UA: NEGATIVE
Nitrite, UA: NEGATIVE
PH UA: 6
Protein, UA: NEGATIVE
Spec Grav, UA: 1.02
Urobilinogen, UA: 0.2

## 2014-09-08 MED ORDER — TRAMADOL HCL 50 MG PO TABS: 50.0000 mg | ORAL_TABLET | Freq: Four times a day (QID) | ORAL | Status: DC | PRN

## 2014-09-08 MED ORDER — ONDANSETRON HCL 8 MG PO TABS: 8.0000 mg | ORAL_TABLET | Freq: Three times a day (TID) | ORAL | Status: DC | PRN

## 2014-09-08 MED ORDER — CYCLOBENZAPRINE HCL 10 MG PO TABS: 10.0000 mg | ORAL_TABLET | Freq: Three times a day (TID) | ORAL | Status: DC | PRN

## 2014-09-08 MED ORDER — GLUCOSE BLOOD VI STRP: ORAL_STRIP | Status: DC

## 2014-09-08 NOTE — Patient Instructions (Signed)
We are checking the urine for signs of infection and also the blood work. We will call you back with the results.   Come back in about 1 year for a physical. Come back and get a flu shot this fall. You will be due for the second pneumonia shot the first of the year.   Health Maintenance Adopting a healthy lifestyle and getting preventive care can go a long way to promote health and wellness. Talk with your health care provider about what schedule of regular examinations is right for you. This is a good chance for you to check in with your provider about disease prevention and staying healthy. In between checkups, there are plenty of things you can do on your own. Experts have done a lot of research about which lifestyle changes and preventive measures are most likely to keep you healthy. Ask your health care provider for more information. WEIGHT AND DIET  Eat a healthy diet  Be sure to include plenty of vegetables, fruits, low-fat dairy products, and lean protein.  Do not eat a lot of foods high in solid fats, added sugars, or salt.  Get regular exercise. This is one of the most important things you can do for your health.  Most adults should exercise for at least 150 minutes each week. The exercise should increase your heart rate and make you sweat (moderate-intensity exercise).  Most adults should also do strengthening exercises at least twice a week. This is in addition to the moderate-intensity exercise.  Maintain a healthy weight  Body mass index (BMI) is a measurement that can be used to identify possible weight problems. It estimates body fat based on height and weight. Your health care provider can help determine your BMI and help you achieve or maintain a healthy weight.  For females 27 years of age and older:   A BMI below 18.5 is considered underweight.  A BMI of 18.5 to 24.9 is normal.  A BMI of 25 to 29.9 is considered overweight.  A BMI of 30 and above is considered  obese.  Watch levels of cholesterol and blood lipids  You should start having your blood tested for lipids and cholesterol at 65 years of age, then have this test every 5 years.  You may need to have your cholesterol levels checked more often if:  Your lipid or cholesterol levels are high.  You are older than 65 years of age.  You are at high risk for heart disease.  CANCER SCREENING   Lung Cancer  Lung cancer screening is recommended for adults 32-11 years old who are at high risk for lung cancer because of a history of smoking.  A yearly low-dose CT scan of the lungs is recommended for people who:  Currently smoke.  Have quit within the past 15 years.  Have at least a 30-pack-year history of smoking. A pack year is smoking an average of one pack of cigarettes a day for 1 year.  Yearly screening should continue until it has been 15 years since you quit.  Yearly screening should stop if you develop a health problem that would prevent you from having lung cancer treatment.  Breast Cancer  Practice breast self-awareness. This means understanding how your breasts normally appear and feel.  It also means doing regular breast self-exams. Let your health care provider know about any changes, no matter how small.  If you are in your 20s or 30s, you should have a clinical breast exam (CBE) by  a health care provider every 1-3 years as part of a regular health exam.  If you are 65 or older, have a CBE every year. Also consider having a breast X-ray (mammogram) every year.  If you have a family history of breast cancer, talk to your health care provider about genetic screening.  If you are at high risk for breast cancer, talk to your health care provider about having an MRI and a mammogram every year.  Breast cancer gene (BRCA) assessment is recommended for women who have family members with BRCA-related cancers. BRCA-related cancers  include:  Breast.  Ovarian.  Tubal.  Peritoneal cancers.  Results of the assessment will determine the need for genetic counseling and BRCA1 and BRCA2 testing. Cervical Cancer Routine pelvic examinations to screen for cervical cancer are no longer recommended for nonpregnant women who are considered low risk for cancer of the pelvic organs (ovaries, uterus, and vagina) and who do not have symptoms. A pelvic examination may be necessary if you have symptoms including those associated with pelvic infections. Ask your health care provider if a screening pelvic exam is right for you.   The Pap test is the screening test for cervical cancer for women who are considered at risk.  If you had a hysterectomy for a problem that was not cancer or a condition that could lead to cancer, then you no longer need Pap tests.  If you are older than 65 years, and you have had normal Pap tests for the past 10 years, you no longer need to have Pap tests.  If you have had past treatment for cervical cancer or a condition that could lead to cancer, you need Pap tests and screening for cancer for at least 20 years after your treatment.  If you no longer get a Pap test, assess your risk factors if they change (such as having a new sexual partner). This can affect whether you should start being screened again.  Some women have medical problems that increase their chance of getting cervical cancer. If this is the case for you, your health care provider may recommend more frequent screening and Pap tests.  The human papillomavirus (HPV) test is another test that may be used for cervical cancer screening. The HPV test looks for the virus that can cause cell changes in the cervix. The cells collected during the Pap test can be tested for HPV.  The HPV test can be used to screen women 30 years of age and older. Getting tested for HPV can extend the interval between normal Pap tests from three to five years.  An HPV  test also should be used to screen women of any age who have unclear Pap test results.  After 65 years of age, women should have HPV testing as often as Pap tests.  Colorectal Cancer  This type of cancer can be detected and often prevented.  Routine colorectal cancer screening usually begins at 65 years of age and continues through 65 years of age.  Your health care provider may recommend screening at an earlier age if you have risk factors for colon cancer.  Your health care provider may also recommend using home test kits to check for hidden blood in the stool.  A small camera at the end of a tube can be used to examine your colon directly (sigmoidoscopy or colonoscopy). This is done to check for the earliest forms of colorectal cancer.  Routine screening usually begins at age 50.  Direct examination   of the colon should be repeated every 5-10 years through 65 years of age. However, you may need to be screened more often if early forms of precancerous polyps or small growths are found. Skin Cancer  Check your skin from head to toe regularly.  Tell your health care provider about any new moles or changes in moles, especially if there is a change in a mole's shape or color.  Also tell your health care provider if you have a mole that is larger than the size of a pencil eraser.  Always use sunscreen. Apply sunscreen liberally and repeatedly throughout the day.  Protect yourself by wearing long sleeves, pants, a wide-brimmed hat, and sunglasses whenever you are outside. HEART DISEASE, DIABETES, AND HIGH BLOOD PRESSURE   Have your blood pressure checked at least every 1-2 years. High blood pressure causes heart disease and increases the risk of stroke.  If you are between 55 years and 79 years old, ask your health care provider if you should take aspirin to prevent strokes.  Have regular diabetes screenings. This involves taking a blood sample to check your fasting blood sugar  level.  If you are at a normal weight and have a low risk for diabetes, have this test once every three years after 65 years of age.  If you are overweight and have a high risk for diabetes, consider being tested at a younger age or more often. PREVENTING INFECTION  Hepatitis B  If you have a higher risk for hepatitis B, you should be screened for this virus. You are considered at high risk for hepatitis B if:  You were born in a country where hepatitis B is common. Ask your health care provider which countries are considered high risk.  Your parents were born in a high-risk country, and you have not been immunized against hepatitis B (hepatitis B vaccine).  You have HIV or AIDS.  You use needles to inject street drugs.  You live with someone who has hepatitis B.  You have had sex with someone who has hepatitis B.  You get hemodialysis treatment.  You take certain medicines for conditions, including cancer, organ transplantation, and autoimmune conditions. Hepatitis C  Blood testing is recommended for:  Everyone born from 1945 through 1965.  Anyone with known risk factors for hepatitis C. Sexually transmitted infections (STIs)  You should be screened for sexually transmitted infections (STIs) including gonorrhea and chlamydia if:  You are sexually active and are younger than 65 years of age.  You are older than 65 years of age and your health care provider tells you that you are at risk for this type of infection.  Your sexual activity has changed since you were last screened and you are at an increased risk for chlamydia or gonorrhea. Ask your health care provider if you are at risk.  If you do not have HIV, but are at risk, it may be recommended that you take a prescription medicine daily to prevent HIV infection. This is called pre-exposure prophylaxis (PrEP). You are considered at risk if:  You are sexually active and do not regularly use condoms or know the HIV status  of your partner(s).  You take drugs by injection.  You are sexually active with a partner who has HIV. Talk with your health care provider about whether you are at high risk of being infected with HIV. If you choose to begin PrEP, you should first be tested for HIV. You should then be tested every   3 months for as long as you are taking PrEP.  PREGNANCY   If you are premenopausal and you may become pregnant, ask your health care provider about preconception counseling.  If you may become pregnant, take 400 to 800 micrograms (mcg) of folic acid every day.  If you want to prevent pregnancy, talk to your health care provider about birth control (contraception). OSTEOPOROSIS AND MENOPAUSE   Osteoporosis is a disease in which the bones lose minerals and strength with aging. This can result in serious bone fractures. Your risk for osteoporosis can be identified using a bone density scan.  If you are 65 years of age or older, or if you are at risk for osteoporosis and fractures, ask your health care provider if you should be screened.  Ask your health care provider whether you should take a calcium or vitamin D supplement to lower your risk for osteoporosis.  Menopause may have certain physical symptoms and risks.  Hormone replacement therapy may reduce some of these symptoms and risks. Talk to your health care provider about whether hormone replacement therapy is right for you.  HOME CARE INSTRUCTIONS   Schedule regular health, dental, and eye exams.  Stay current with your immunizations.   Do not use any tobacco products including cigarettes, chewing tobacco, or electronic cigarettes.  If you are pregnant, do not drink alcohol.  If you are breastfeeding, limit how much and how often you drink alcohol.  Limit alcohol intake to no more than 1 drink per day for nonpregnant women. One drink equals 12 ounces of beer, 5 ounces of wine, or 1 ounces of hard liquor.  Do not use street  drugs.  Do not share needles.  Ask your health care provider for help if you need support or information about quitting drugs.  Tell your health care provider if you often feel depressed.  Tell your health care provider if you have ever been abused or do not feel safe at home. Document Released: 08/08/2010 Document Revised: 06/09/2013 Document Reviewed: 12/25/2012 Exit[employer] Patient Information 2015 [employer]. This information is not intended to replace advice given to you by your health care provider. Make sure you discuss any questions you have with your health care provider.  

## 2014-09-08 NOTE — Assessment & Plan Note (Signed)
Shingles shot given at visit. Talked to her about the need to exercise and counseled her about the need to lose weight. She declines mammogram and colonoscopy. She will get flu shot this fall. Due for prevnar end of December. Checking labs and referring for hearing exam.

## 2014-09-08 NOTE — Assessment & Plan Note (Signed)
Likely from combination of diabetes and hypertension both of which are well controlled now. Checking BMP today.

## 2014-09-08 NOTE — Progress Notes (Signed)
   Subjective:    Patient ID: Katie Higgins, female    DOB: 05-Dec-1950, 64 y.o.   MRN: [id number]  HPI The patient is a 64 YO female here for wellness. No new complaints. See A/P for status and treatment of chronic medical problems. Stable neuropathy in her feet.   PMH, [facility], social history reviewed and updated.   Review of Systems  Constitutional: Positive for activity change. Negative for fever, appetite change, fatigue and unexpected weight change.  HENT: Negative.   Eyes: Negative.   Respiratory: Negative for cough, chest tightness, shortness of breath and wheezing.   Cardiovascular: Negative for chest pain, palpitations and leg swelling.  Gastrointestinal: Negative for nausea, abdominal pain, diarrhea, constipation and abdominal distention.  Musculoskeletal: Positive for arthralgias and gait problem. Negative for myalgias and back pain.  Skin: Negative.   Neurological: Positive for numbness. Negative for dizziness, syncope, weakness and headaches.  Psychiatric/Behavioral: Negative.       Objective:   Physical Exam  Constitutional: She is oriented to person, place, and time. She appears well-developed and well-nourished.  Morbidly overweight  HENT:  Head: Normocephalic and atraumatic.  Right Ear: External ear normal.  Left Ear: External ear normal.  Eyes: EOM are normal.  Neck: Normal range of motion.  Cardiovascular: Normal rate and regular rhythm.   Pulmonary/Chest: Effort normal and breath sounds normal.  Sounds diminished with habitus  Abdominal: Soft. She exhibits no distension. There is no tenderness. There is no rebound.  Neurological: She is alert and oriented to person, place, and time. Coordination abnormal.  Uses wheelchair, able to stand for 2 minutes and to transfer.   Skin: Skin is warm and dry.  See foot exam  Psychiatric: She has a normal mood and affect.   Filed Vitals:   09/08/14 1325  BP: 142/72  Pulse: 94  Temp: 98.2 F (36.8 C)  TempSrc: Oral    Resp: 20  Height: 5\' 2"  (1.575 m)  Weight: 274 lb 1.9 oz (124.34 kg)  SpO2: 98%      Assessment & Plan:  Shingles shot given at visit.

## 2014-09-08 NOTE — Addendum Note (Signed)
Addended by: Resa Miner R on: 09/08/2014 03:29 PM   Modules accepted: Orders

## 2014-09-08 NOTE — Progress Notes (Signed)
Pre visit review using our clinic review tool, if applicable. No additional management support is needed unless otherwise documented below in the visit note. 

## 2014-09-08 NOTE — Assessment & Plan Note (Signed)
BP at goal on lasix, coreg, and enalapril. Checking CMP.

## 2014-09-08 NOTE — Assessment & Plan Note (Signed)
This is a severe problem limiting her mobility. She is wheelchair bound and has no intentions of trying to get walking again. Able to walk a few steps to transfer and stand for about 2 minutes to brush hair or teeth. Complicated by diabetes, hypertension, severe arthritis.

## 2014-09-08 NOTE — Assessment & Plan Note (Signed)
Referral for optho for exam, has not had eye exam in 3+ years. Checking HgA1c and lipid panel. She is not on treatment at this time. On ACE-I. Has stable neuropathy in her feet with numbness of the 2nd and 3rd toe on the left foot. Foot exam done today.

## 2014-09-09 ENCOUNTER — Encounter: Payer: Self-pay | Admitting: Cardiology

## 2014-09-09 ENCOUNTER — Ambulatory Visit (INDEPENDENT_AMBULATORY_CARE_PROVIDER_SITE_OTHER): Payer: Medicare Other | Admitting: Cardiology

## 2014-09-09 ENCOUNTER — Encounter: Payer: Self-pay | Admitting: *Deleted

## 2014-09-09 VITALS — BP 110/68 | HR 74 | Ht 62.0 in | Wt 272.0 lb

## 2014-09-09 DIAGNOSIS — I5032 Chronic diastolic (congestive) heart failure: Secondary | ICD-10-CM | POA: Diagnosis not present

## 2014-09-09 DIAGNOSIS — N183 Chronic kidney disease, stage 3 unspecified: Secondary | ICD-10-CM

## 2014-09-09 DIAGNOSIS — R0602 Shortness of breath: Secondary | ICD-10-CM | POA: Diagnosis not present

## 2014-09-09 MED ORDER — LOVASTATIN 10 MG PO TABS: 10.0000 mg | ORAL_TABLET | Freq: Every day | ORAL | Status: DC

## 2014-09-09 MED ORDER — ENALAPRIL MALEATE 10 MG PO TABS: 10.0000 mg | ORAL_TABLET | Freq: Every day | ORAL | Status: DC

## 2014-09-09 MED ORDER — CARVEDILOL 25 MG PO TABS: 25.0000 mg | ORAL_TABLET | Freq: Two times a day (BID) | ORAL | Status: DC

## 2014-09-09 MED ORDER — FUROSEMIDE 20 MG PO TABS
20.0000 mg | ORAL_TABLET | Freq: Every day | ORAL | Status: DC
Start: 2014-09-09 — End: 2015-09-10

## 2014-09-09 NOTE — Patient Instructions (Signed)
Medication Instructions:  No medication changes today.  Labwork: None.  Testing/Procedures: Your physician has requested that you have an echocardiogram. Echocardiography is a painless test that uses sound waves to create images of your heart. It provides your doctor with information about the size and shape of your heart and how well your heart's chambers and valves are working. This procedure takes approximately one hour. There are no restrictions for this procedure.    Follow-Up: Your physician wants you to follow-up in: 1 year with Dr [name]. (August 2017).  You will receive a reminder letter in the mail two months in advance. If you don't receive a letter, please call our office to schedule the follow-up appointment.    Thank you for choosing [facility]!!

## 2014-09-10 NOTE — Progress Notes (Signed)
Patient ID: Katie Higgins, female   DOB: 1950-04-21, 64 y.o.   MRN: [id number] PCP: Dr. Asa Lente  64 yo with history of obesity was admitted to [facility] in 3/11 with shortness of breath. She was found to have a CHF exacerbation. TSH was noted to be suppressed and free T4 was very high. Patient was begun on methimazole and diuresed. Echo was a difficult study but showed mild to moderately decreased EF. Myoview showed EF 38% with multiple fixed deficits. Left heart cath showed no significant coronary disease with EF 40%. It was thought that her cardiomyopathy may be the result of hyperthyroidism. Hyperthyroidism is now being treated by Dr. [name]. Repeat echo in 9/11 showed EF 55-60%.  She is stable symptomatically. She has been in a wheelchair when outside the house for a long time due to low back, knee, and hip arthritis. She has refused total hip replacement in the past. She is able to walk short distances in her house without dyspnea with her walker. 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  No chest pain. She is taking Lasix three times a week.   She has lost about 25 lbs since I last saw her through diet.    Labs (3/11): K 4, creatinine 0.7, BNP 603=>230, free T4 3.2 (high), free T3 8.9 (high), TSH 0.008, LDL 49, HDL 45, HCT 33.5  Labs (4/11): k 3.8, creatinine 0.8, BNP 47  Labs (8/11): creatinine 1.3  Labs (8/12): K 4.8, creatinine 1.13 Labs (12/12): TSH normal Labs (4/13): LDL 59, HDL 43 Labs (10/13): K 5.5, creatinine 1.4 Labs (1/14): TSH normal Labs (4/14): LDL 60, HDL 38 Labs (1/15): TSH normal, K 4.5, creatinine 1.6 Labs (5/16): TSH normal Labs (8/16): LDL 52, HDL 53, K 5, creatinine 1.27  Allergies (verified):  1) ! * Hydrocodone/apap   Past Medical History:  1. DM: diet-controlled  2. HYPERTENSION   3. HYPERTHYROIDISM ( 4. CARDIOMYOPATHY: Nonischemic. Admitted in 3/11 with CHF exac, due to hyperthyroidism. LHC (3/11) showed clean coronaries with EF 40%. Myoview  showed EF 38%. Echo was a technically difficult study with mild to moderately decreased EF, mild LVH, mild MR. Repeat echo (9/11) with EF 73-73%, mild diastolic dysfunction (grade I).  5. Osteoarthritis - esp L hip, low back  6. Obesity  7. s/p CCY  8. CKD  Family History:  No premature CAD  mom - expired age 4y - DM, breast cancer, cervical cancer  dad - expired age 18y - CVA, kidney cancer   Social History:  Lives in [place] with her son.  widowed since 1992  She is very sedentary, uses a wheelchair when out of the house.  ambulatory in home with cane  No smoking or ETOH.   ROS: All systems reviewed and negative except as per HPI.   Current Outpatient Prescriptions  Medication Sig Dispense Refill  . aspirin 81 MG tablet Take 81 mg by mouth daily.      . carvedilol (COREG) 25 MG tablet Take 1 tablet (25 mg total) by mouth 2 (two) times daily. 180 tablet 3  . colchicine 0.6 MG tablet Take 1 tablet (0.6 mg total) by mouth daily. 90 tablet 0  . cyclobenzaprine (FLEXERIL) 10 MG tablet Take 1 tablet (10 mg total) by mouth every 8 (eight) hours as needed for muscle spasms. 90 tablet 0  . diphenhydrAMINE (BENADRYL) 25 mg capsule Take 25 mg by mouth every 4 (four) hours as needed. For allergies     .  enalapril (VASOTEC) 10 MG tablet Take 1 tablet (10 mg total) by mouth daily. 90 tablet 3  . furosemide (LASIX) 20 MG tablet Take 1 tablet (20 mg total) by mouth daily. 90 tablet 3  . glucose blood test strip Use as instructed with glucometer to test blood sugar at least twice a day. 100 each 12  . glucose monitoring kit (FREESTYLE) monitoring kit Any glucometer and supplies (strips and lancets) Dx 250.00 1 each 0  . lovastatin (MEVACOR) 10 MG tablet Take 1 tablet (10 mg total) by mouth at bedtime. 90 tablet 2  . meloxicam (MOBIC) 15 MG tablet Take 1 tablet (15 mg total) by mouth daily. 90 tablet 0  . methimazole (TAPAZOLE) 5 MG tablet Take 1 tablet (5 mg total) by mouth daily. 90 tablet 2   . Multiple Vitamin (MULTIVITAMIN) tablet Take 1 tablet by mouth daily.      . ondansetron (ZOFRAN) 8 MG tablet Take 1 tablet (8 mg total) by mouth every 8 (eight) hours as needed for nausea or vomiting. 90 tablet 0  . traMADol (ULTRAM) 50 MG tablet Take 1 tablet (50 mg total) by mouth every 6 (six) hours as needed. 180 tablet 0   No current facility-administered medications for this visit.    BP 110/68 mmHg  Pulse 74  Ht 5' 2"  (1.575 m)  Wt 272 lb (123.378 kg)  BMI 49.74 kg/m2 General: NAD, morbidly obese.  Neck: No JVD, no thyromegaly or thyroid nodule.  Lungs: Clear to auscultation bilaterally with normal respiratory effort. CV: Nondisplaced PMI.  Heart regular S1/S2, no S3/S4, no murmur.  No edema.  No carotid bruit.  Normal pedal pulses.  Abdomen: Soft, nontender, no hepatosplenomegaly, no distention.   Neurologic: Alert and oriented x 3.  Psych: Normal affect. Extremities: No clubbing or cyanosis.   Assessment/Plan:  1. Morbid obesity: This is her single biggest problem at this time.  She is somewhat limited orthopedically, so I am not sure she is going to be able to increase her exercise level.  She has been working on changing her diet and has lost considerable weight though she has a ways to go. 2. Cardiomyopathy: Resolved after treatment of hyperthyroidism.  Stable exertional symptoms (mostly limited by orthopedic problems).  She looks near-euvolemic taking Lasix three times a week. She has not had an echo in 5 years.  I will get an echo to make sure that EF has remained normal.  3. Hyperlipidemia: Good lipids 8/16.  4. CKD: Creatinine has been stable.   Loralie Champagne 09/10/2014

## 2014-09-11 LAB — CULTURE, URINE COMPREHENSIVE

## 2014-09-14 ENCOUNTER — Encounter: Payer: Self-pay | Admitting: Internal Medicine

## 2014-09-14 ENCOUNTER — Telehealth: Payer: Self-pay | Admitting: Endocrinology

## 2014-09-14 DIAGNOSIS — I5032 Chronic diastolic (congestive) heart failure: Secondary | ICD-10-CM

## 2014-09-14 DIAGNOSIS — R0602 Shortness of breath: Secondary | ICD-10-CM

## 2014-09-14 MED ORDER — CYCLOBENZAPRINE HCL 10 MG PO TABS: 10.0000 mg | ORAL_TABLET | Freq: Three times a day (TID) | ORAL | Status: DC | PRN

## 2014-09-14 MED ORDER — METHIMAZOLE 5 MG PO TABS: 5.0000 mg | ORAL_TABLET | Freq: Every day | ORAL | Status: DC

## 2014-09-14 MED ORDER — GLUCOSE BLOOD VI STRP
ORAL_STRIP | Status: DC
Start: 2014-09-14 — End: 2015-05-24

## 2014-09-14 MED ORDER — ONDANSETRON HCL 8 MG PO TABS: 8.0000 mg | ORAL_TABLET | Freq: Three times a day (TID) | ORAL | Status: DC | PRN

## 2014-09-14 NOTE — Telephone Encounter (Signed)
Rx sent per pt's request.  

## 2014-09-14 NOTE — Telephone Encounter (Signed)
Patient called stating she would like a refill sent to her pharmacy   Rx: Methimazole   Pharmacy: [facility]

## 2014-09-15 ENCOUNTER — Other Ambulatory Visit: Payer: Self-pay

## 2014-09-15 ENCOUNTER — Ambulatory Visit (HOSPITAL_COMMUNITY): Payer: Medicare Other | Attending: Interventional Cardiology

## 2014-09-15 DIAGNOSIS — I517 Cardiomegaly: Secondary | ICD-10-CM | POA: Diagnosis not present

## 2014-09-15 DIAGNOSIS — I129 Hypertensive chronic kidney disease with stage 1 through stage 4 chronic kidney disease, or unspecified chronic kidney disease: Secondary | ICD-10-CM | POA: Insufficient documentation

## 2014-09-15 DIAGNOSIS — I5032 Chronic diastolic (congestive) heart failure: Secondary | ICD-10-CM | POA: Diagnosis not present

## 2014-09-15 DIAGNOSIS — N189 Chronic kidney disease, unspecified: Secondary | ICD-10-CM | POA: Insufficient documentation

## 2014-09-15 DIAGNOSIS — R0602 Shortness of breath: Secondary | ICD-10-CM

## 2014-09-15 DIAGNOSIS — E119 Type 2 diabetes mellitus without complications: Secondary | ICD-10-CM | POA: Insufficient documentation

## 2014-09-15 DIAGNOSIS — E785 Hyperlipidemia, unspecified: Secondary | ICD-10-CM | POA: Insufficient documentation

## 2014-09-15 DIAGNOSIS — I509 Heart failure, unspecified: Secondary | ICD-10-CM | POA: Diagnosis present

## 2014-09-25 ENCOUNTER — Other Ambulatory Visit: Payer: Self-pay | Admitting: Internal Medicine

## 2014-09-25 ENCOUNTER — Other Ambulatory Visit: Payer: Self-pay | Admitting: Geriatric Medicine

## 2014-09-25 MED ORDER — CIPROFLOXACIN HCL 250 MG PO TABS: 250.0000 mg | ORAL_TABLET | Freq: Two times a day (BID) | ORAL | Status: DC

## 2014-09-29 ENCOUNTER — Encounter: Payer: Self-pay | Admitting: Internal Medicine

## 2014-11-09 DIAGNOSIS — H903 Sensorineural hearing loss, bilateral: Secondary | ICD-10-CM | POA: Diagnosis not present

## 2014-11-09 DIAGNOSIS — R0982 Postnasal drip: Secondary | ICD-10-CM | POA: Diagnosis not present

## 2014-11-09 DIAGNOSIS — R05 Cough: Secondary | ICD-10-CM | POA: Diagnosis not present

## 2014-11-13 ENCOUNTER — Telehealth: Payer: Self-pay | Admitting: Internal Medicine

## 2014-11-13 DIAGNOSIS — M2550 Pain in unspecified joint: Secondary | ICD-10-CM

## 2014-11-16 MED ORDER — CYCLOBENZAPRINE HCL 10 MG PO TABS: 10.0000 mg | ORAL_TABLET | Freq: Three times a day (TID) | ORAL | Status: DC | PRN

## 2014-11-16 MED ORDER — TRAMADOL HCL 50 MG PO TABS: 50.0000 mg | ORAL_TABLET | Freq: Four times a day (QID) | ORAL | Status: DC | PRN

## 2014-11-16 NOTE — Telephone Encounter (Signed)
Printed and signed.  

## 2014-11-16 NOTE — Addendum Note (Signed)
Addended by: Pricilla Holm A on: 11/16/2014 09:35 AM   Modules accepted: Orders

## 2014-11-19 ENCOUNTER — Other Ambulatory Visit: Payer: Self-pay | Admitting: Family Medicine

## 2014-11-19 ENCOUNTER — Other Ambulatory Visit: Payer: Self-pay | Admitting: Internal Medicine

## 2014-11-19 ENCOUNTER — Other Ambulatory Visit: Payer: Self-pay | Admitting: Geriatric Medicine

## 2014-11-19 DIAGNOSIS — M2550 Pain in unspecified joint: Secondary | ICD-10-CM

## 2014-11-19 MED ORDER — MELOXICAM 15 MG PO TABS: 15.0000 mg | ORAL_TABLET | Freq: Every day | ORAL | Status: DC

## 2014-11-19 MED ORDER — CYCLOBENZAPRINE HCL 10 MG PO TABS: 10.0000 mg | ORAL_TABLET | Freq: Three times a day (TID) | ORAL | Status: DC | PRN

## 2014-11-19 NOTE — Addendum Note (Signed)
Addended by: Pricilla Holm A on: 11/19/2014 01:21 PM   Modules accepted: Orders

## 2014-11-19 NOTE — Telephone Encounter (Signed)
Will fill flexeril but needs visit for tramadol since not rxed since Jan 2016.

## 2014-12-16 ENCOUNTER — Other Ambulatory Visit: Payer: Self-pay | Admitting: Internal Medicine

## 2014-12-17 ENCOUNTER — Other Ambulatory Visit: Payer: Self-pay | Admitting: Geriatric Medicine

## 2014-12-17 MED ORDER — ONDANSETRON HCL 8 MG PO TABS: 8.0000 mg | ORAL_TABLET | Freq: Three times a day (TID) | ORAL | Status: DC | PRN

## 2014-12-30 ENCOUNTER — Encounter: Payer: Self-pay | Admitting: Internal Medicine

## 2014-12-30 ENCOUNTER — Other Ambulatory Visit: Payer: Self-pay | Admitting: Geriatric Medicine

## 2014-12-30 MED ORDER — ONDANSETRON HCL 8 MG PO TABS: 8.0000 mg | ORAL_TABLET | Freq: Three times a day (TID) | ORAL | Status: DC | PRN

## 2015-01-02 ENCOUNTER — Encounter: Payer: Self-pay | Admitting: Internal Medicine

## 2015-01-05 MED ORDER — BENZONATATE 100 MG PO CAPS
100.0000 mg | ORAL_CAPSULE | Freq: Two times a day (BID) | ORAL | Status: DC | PRN
Start: 2015-01-05 — End: 2015-01-15

## 2015-01-13 ENCOUNTER — Other Ambulatory Visit: Payer: Self-pay | Admitting: Geriatric Medicine

## 2015-01-13 ENCOUNTER — Encounter: Payer: Self-pay | Admitting: Internal Medicine

## 2015-01-15 ENCOUNTER — Other Ambulatory Visit: Payer: Self-pay | Admitting: Geriatric Medicine

## 2015-01-15 MED ORDER — BENZONATATE 100 MG PO CAPS: 100.0000 mg | ORAL_CAPSULE | Freq: Two times a day (BID) | ORAL | Status: DC | PRN

## 2015-01-21 ENCOUNTER — Ambulatory Visit: Payer: Self-pay | Admitting: Family Medicine

## 2015-02-01 ENCOUNTER — Other Ambulatory Visit: Payer: Self-pay | Admitting: Internal Medicine

## 2015-02-02 MED ORDER — FREESTYLE SYSTEM KIT: PACK | Status: DC

## 2015-02-02 NOTE — Addendum Note (Signed)
Addended by: Karle Barr on: 02/02/2015 09:37 AM   Modules accepted: Orders

## 2015-02-09 ENCOUNTER — Encounter: Payer: Self-pay | Admitting: Internal Medicine

## 2015-02-24 ENCOUNTER — Telehealth: Payer: Self-pay | Admitting: Internal Medicine

## 2015-02-24 DIAGNOSIS — M2550 Pain in unspecified joint: Secondary | ICD-10-CM

## 2015-03-01 ENCOUNTER — Encounter: Payer: Self-pay | Admitting: Internal Medicine

## 2015-03-01 MED ORDER — TRAMADOL HCL 50 MG PO TABS: 50.0000 mg | ORAL_TABLET | Freq: Four times a day (QID) | ORAL | Status: DC | PRN

## 2015-03-01 NOTE — Telephone Encounter (Signed)
Printed and signed, please fax.  

## 2015-03-01 NOTE — Telephone Encounter (Signed)
Sent to pharmacy 

## 2015-03-01 NOTE — Addendum Note (Signed)
Addended by: Hoyt Koch on: 03/01/2015 09:38 AM   Modules accepted: Orders

## 2015-04-13 ENCOUNTER — Other Ambulatory Visit: Payer: Self-pay | Admitting: Internal Medicine

## 2015-04-13 ENCOUNTER — Other Ambulatory Visit: Payer: Self-pay | Admitting: Geriatric Medicine

## 2015-04-13 MED ORDER — CYCLOBENZAPRINE HCL 10 MG PO TABS: 10.0000 mg | ORAL_TABLET | Freq: Three times a day (TID) | ORAL | Status: DC | PRN

## 2015-04-21 ENCOUNTER — Ambulatory Visit: Payer: Medicare Other | Admitting: Endocrinology

## 2015-04-26 ENCOUNTER — Encounter: Payer: Self-pay | Admitting: Family Medicine

## 2015-04-27 ENCOUNTER — Encounter: Payer: Self-pay | Admitting: Internal Medicine

## 2015-04-27 MED ORDER — MELOXICAM 15 MG PO TABS: 15.0000 mg | ORAL_TABLET | Freq: Every day | ORAL | Status: DC

## 2015-04-28 ENCOUNTER — Other Ambulatory Visit: Payer: Self-pay | Admitting: Geriatric Medicine

## 2015-04-28 MED ORDER — ONDANSETRON HCL 8 MG PO TABS: 8.0000 mg | ORAL_TABLET | Freq: Three times a day (TID) | ORAL | Status: DC | PRN

## 2015-05-07 ENCOUNTER — Ambulatory Visit: Payer: Medicare Other | Admitting: Endocrinology

## 2015-05-20 ENCOUNTER — Encounter (HOSPITAL_COMMUNITY): Payer: Self-pay | Admitting: Emergency Medicine

## 2015-05-20 ENCOUNTER — Emergency Department (HOSPITAL_COMMUNITY): Payer: Medicare Other

## 2015-05-20 ENCOUNTER — Inpatient Hospital Stay (HOSPITAL_COMMUNITY)
Admission: EM | Admit: 2015-05-20 | Discharge: 2015-05-24 | DRG: 872 | Disposition: A | Discharge status: Home / Self Care | Payer: Medicare Other | Attending: Internal Medicine | Admitting: Internal Medicine

## 2015-05-20 DIAGNOSIS — R059 Cough, unspecified: Secondary | ICD-10-CM | POA: Diagnosis present

## 2015-05-20 DIAGNOSIS — K5792 Diverticulitis of intestine, part unspecified, without perforation or abscess without bleeding: Secondary | ICD-10-CM | POA: Diagnosis present

## 2015-05-20 DIAGNOSIS — B349 Viral infection, unspecified: Secondary | ICD-10-CM | POA: Diagnosis present

## 2015-05-20 DIAGNOSIS — N179 Acute kidney failure, unspecified: Secondary | ICD-10-CM | POA: Diagnosis present

## 2015-05-20 DIAGNOSIS — I5032 Chronic diastolic (congestive) heart failure: Secondary | ICD-10-CM | POA: Diagnosis present

## 2015-05-20 DIAGNOSIS — R112 Nausea with vomiting, unspecified: Secondary | ICD-10-CM | POA: Diagnosis present

## 2015-05-20 DIAGNOSIS — A419 Sepsis, unspecified organism: Secondary | ICD-10-CM | POA: Diagnosis not present

## 2015-05-20 DIAGNOSIS — K5730 Diverticulosis of large intestine without perforation or abscess without bleeding: Secondary | ICD-10-CM | POA: Diagnosis not present

## 2015-05-20 DIAGNOSIS — Z9049 Acquired absence of other specified parts of digestive tract: Secondary | ICD-10-CM

## 2015-05-20 DIAGNOSIS — Z23 Encounter for immunization: Secondary | ICD-10-CM

## 2015-05-20 DIAGNOSIS — M1712 Unilateral primary osteoarthritis, left knee: Secondary | ICD-10-CM | POA: Diagnosis present

## 2015-05-20 DIAGNOSIS — I130 Hypertensive heart and chronic kidney disease with heart failure and stage 1 through stage 4 chronic kidney disease, or unspecified chronic kidney disease: Secondary | ICD-10-CM | POA: Diagnosis present

## 2015-05-20 DIAGNOSIS — I428 Other cardiomyopathies: Secondary | ICD-10-CM | POA: Diagnosis present

## 2015-05-20 DIAGNOSIS — R05 Cough: Secondary | ICD-10-CM | POA: Diagnosis not present

## 2015-05-20 DIAGNOSIS — R197 Diarrhea, unspecified: Secondary | ICD-10-CM

## 2015-05-20 DIAGNOSIS — I10 Essential (primary) hypertension: Secondary | ICD-10-CM | POA: Diagnosis present

## 2015-05-20 DIAGNOSIS — E039 Hypothyroidism, unspecified: Secondary | ICD-10-CM | POA: Diagnosis present

## 2015-05-20 DIAGNOSIS — M5416 Radiculopathy, lumbar region: Secondary | ICD-10-CM | POA: Diagnosis present

## 2015-05-20 DIAGNOSIS — E1122 Type 2 diabetes mellitus with diabetic chronic kidney disease: Secondary | ICD-10-CM | POA: Diagnosis present

## 2015-05-20 DIAGNOSIS — E785 Hyperlipidemia, unspecified: Secondary | ICD-10-CM | POA: Diagnosis present

## 2015-05-20 DIAGNOSIS — Z6841 Body Mass Index (BMI) 40.0 and over, adult: Secondary | ICD-10-CM | POA: Diagnosis not present

## 2015-05-20 DIAGNOSIS — R7881 Bacteremia: Secondary | ICD-10-CM

## 2015-05-20 DIAGNOSIS — Z7982 Long term (current) use of aspirin: Secondary | ICD-10-CM

## 2015-05-20 DIAGNOSIS — A4151 Sepsis due to Escherichia coli [E. coli]: Principal | ICD-10-CM | POA: Diagnosis present

## 2015-05-20 DIAGNOSIS — E118 Type 2 diabetes mellitus with unspecified complications: Secondary | ICD-10-CM

## 2015-05-20 DIAGNOSIS — N183 Chronic kidney disease, stage 3 unspecified: Secondary | ICD-10-CM | POA: Diagnosis present

## 2015-05-20 DIAGNOSIS — Z79899 Other long term (current) drug therapy: Secondary | ICD-10-CM

## 2015-05-20 DIAGNOSIS — Z7984 Long term (current) use of oral hypoglycemic drugs: Secondary | ICD-10-CM

## 2015-05-20 DIAGNOSIS — R509 Fever, unspecified: Secondary | ICD-10-CM | POA: Diagnosis present

## 2015-05-20 DIAGNOSIS — E782 Mixed hyperlipidemia: Secondary | ICD-10-CM | POA: Diagnosis present

## 2015-05-20 DIAGNOSIS — E875 Hyperkalemia: Secondary | ICD-10-CM | POA: Diagnosis present

## 2015-05-20 DIAGNOSIS — E1169 Type 2 diabetes mellitus with other specified complication: Secondary | ICD-10-CM | POA: Diagnosis present

## 2015-05-20 DIAGNOSIS — E872 Acidosis: Secondary | ICD-10-CM | POA: Diagnosis not present

## 2015-05-20 DIAGNOSIS — R531 Weakness: Secondary | ICD-10-CM | POA: Diagnosis not present

## 2015-05-20 DIAGNOSIS — R404 Transient alteration of awareness: Secondary | ICD-10-CM | POA: Diagnosis not present

## 2015-05-20 HISTORY — DX: Chronic kidney disease, stage 3 unspecified: N18.30

## 2015-05-20 HISTORY — DX: Chronic kidney disease, stage 3 (moderate): N18.3

## 2015-05-20 LAB — URINALYSIS, ROUTINE W REFLEX MICROSCOPIC
Bilirubin Urine: NEGATIVE
GLUCOSE, UA: NEGATIVE mg/dL
HGB URINE DIPSTICK: NEGATIVE
KETONES UR: NEGATIVE mg/dL
LEUKOCYTES UA: NEGATIVE
Nitrite: NEGATIVE
PH: 7 (ref 5.0–8.0)
Protein, ur: NEGATIVE mg/dL
Specific Gravity, Urine: 1.021 (ref 1.005–1.030)

## 2015-05-20 LAB — COMPREHENSIVE METABOLIC PANEL
ALK PHOS: 70 U/L (ref 38–126)
ALT: 13 U/L — AB (ref 14–54)
AST: 20 U/L (ref 15–41)
Albumin: 3.8 g/dL (ref 3.5–5.0)
Anion gap: 12 (ref 5–15)
BILIRUBIN TOTAL: 0.4 mg/dL (ref 0.3–1.2)
BUN: 29 mg/dL — AB (ref 6–20)
CALCIUM: 8.7 mg/dL — AB (ref 8.9–10.3)
CHLORIDE: 107 mmol/L (ref 101–111)
CO2: 22 mmol/L (ref 22–32)
CREATININE: 1.55 mg/dL — AB (ref 0.44–1.00)
GFR calc Af Amer: 39 mL/min — ABNORMAL LOW (ref >60)
GFR, EST NON AFRICAN AMERICAN: 34 mL/min — AB (ref >60)
Glucose, Bld: 115 mg/dL — ABNORMAL HIGH (ref 65–99)
Potassium: 5.5 mmol/L — ABNORMAL HIGH (ref 3.5–5.1)
Sodium: 141 mmol/L (ref 135–145)
Total Protein: 7.2 g/dL (ref 6.5–8.1)

## 2015-05-20 LAB — CBC WITH DIFFERENTIAL/PLATELET
BASOS ABS: 0 10*3/uL (ref 0.0–0.1)
Basophils Relative: 0 %
Eosinophils Absolute: 0.2 10*3/uL (ref 0.0–0.7)
Eosinophils Relative: 3 %
HEMATOCRIT: 35.5 % — AB (ref 36.0–46.0)
HEMOGLOBIN: 12 g/dL (ref 12.0–15.0)
LYMPHS ABS: 0.6 10*3/uL — AB (ref 0.7–4.0)
LYMPHS PCT: 7 %
MCH: 29.1 pg (ref 26.0–34.0)
MCHC: 33.8 g/dL (ref 30.0–36.0)
MCV: 86 fL (ref 78.0–100.0)
Monocytes Absolute: 0.1 10*3/uL (ref 0.1–1.0)
Monocytes Relative: 1 %
NEUTROS ABS: 7 10*3/uL (ref 1.7–7.7)
NEUTROS PCT: 89 %
PLATELETS: 214 10*3/uL (ref 150–400)
RBC: 4.13 MIL/uL (ref 3.87–5.11)
RDW: 14.3 % (ref 11.5–15.5)
WBC: 7.9 10*3/uL (ref 4.0–10.5)

## 2015-05-20 LAB — LACTIC ACID, PLASMA
Lactic Acid, Venous: 1.2 mmol/L (ref 0.5–2.0)
Lactic Acid, Venous: 1.7 mmol/L (ref 0.5–2.0)

## 2015-05-20 LAB — APTT: aPTT: 31 seconds (ref 24–37)

## 2015-05-20 LAB — I-STAT CG4 LACTIC ACID, ED: LACTIC ACID, VENOUS: 2.97 mmol/L — AB (ref 0.5–2.0)

## 2015-05-20 LAB — BASIC METABOLIC PANEL
Anion gap: 12 (ref 5–15)
BUN: 25 mg/dL — AB (ref 6–20)
CALCIUM: 7.8 mg/dL — AB (ref 8.9–10.3)
CO2: 20 mmol/L — ABNORMAL LOW (ref 22–32)
CREATININE: 1.48 mg/dL — AB (ref 0.44–1.00)
Chloride: 112 mmol/L — ABNORMAL HIGH (ref 101–111)
GFR calc non Af Amer: 36 mL/min — ABNORMAL LOW (ref >60)
GFR, EST AFRICAN AMERICAN: 42 mL/min — AB (ref >60)
Glucose, Bld: 143 mg/dL — ABNORMAL HIGH (ref 65–99)
Potassium: 4.7 mmol/L (ref 3.5–5.1)
SODIUM: 144 mmol/L (ref 135–145)

## 2015-05-20 LAB — PROCALCITONIN: PROCALCITONIN: 24.71 ng/mL

## 2015-05-20 LAB — PROTIME-INR
INR: 1.22 (ref 0.00–1.49)
PROTHROMBIN TIME: 15.6 s — AB (ref 11.6–15.2)

## 2015-05-20 LAB — BRAIN NATRIURETIC PEPTIDE: B Natriuretic Peptide: 93.1 pg/mL (ref 0.0–100.0)

## 2015-05-20 LAB — INFLUENZA PANEL BY PCR (TYPE A & B)
H1N1 flu by pcr: NOT DETECTED
Influenza A By PCR: NEGATIVE
Influenza B By PCR: NEGATIVE

## 2015-05-20 MED ORDER — OSELTAMIVIR PHOSPHATE 30 MG PO CAPS
30.0000 mg | ORAL_CAPSULE | Freq: Once | ORAL | Status: AC
  Administered 2015-05-20: 30 mg via ORAL
  Filled 2015-05-20: qty 1

## 2015-05-20 MED ORDER — PIPERACILLIN-TAZOBACTAM 3.375 G IVPB
3.3750 g | Freq: Three times a day (TID) | INTRAVENOUS | Status: DC
  Administered 2015-05-20: 3.375 g via INTRAVENOUS
  Filled 2015-05-20: qty 50

## 2015-05-20 MED ORDER — TRAMADOL HCL 50 MG PO TABS
50.0000 mg | ORAL_TABLET | Freq: Four times a day (QID) | ORAL | Status: DC | PRN
  Administered 2015-05-20 – 2015-05-24 (×5): 50 mg via ORAL
  Filled 2015-05-20 (×6): qty 1

## 2015-05-20 MED ORDER — COLCHICINE 0.6 MG PO TABS
0.6000 mg | ORAL_TABLET | Freq: Every day | ORAL | Status: DC | PRN
Start: 2015-05-20 — End: 2015-05-24

## 2015-05-20 MED ORDER — ASPIRIN EC 81 MG PO TBEC
81.0000 mg | DELAYED_RELEASE_TABLET | Freq: Every day | ORAL | Status: DC
  Administered 2015-05-20 – 2015-05-24 (×5): 81 mg via ORAL
  Filled 2015-05-20 (×5): qty 1

## 2015-05-20 MED ORDER — SODIUM CHLORIDE 0.9 % IV BOLUS (SEPSIS)
1000.0000 mL | INTRAVENOUS | Status: AC
  Administered 2015-05-20 (×4): 1000 mL via INTRAVENOUS

## 2015-05-20 MED ORDER — ACETAMINOPHEN 650 MG RE SUPP: 650.0000 mg | Freq: Four times a day (QID) | RECTAL | Status: DC | PRN

## 2015-05-20 MED ORDER — CYCLOBENZAPRINE HCL 10 MG PO TABS: 10.0000 mg | ORAL_TABLET | Freq: Three times a day (TID) | ORAL | Status: DC | PRN

## 2015-05-20 MED ORDER — SODIUM CHLORIDE 0.9 % IV SOLN
INTRAVENOUS | Status: DC
  Administered 2015-05-20: 07:00:00 via INTRAVENOUS

## 2015-05-20 MED ORDER — SODIUM CHLORIDE 0.9 % IV BOLUS (SEPSIS)
500.0000 mL | Freq: Once | INTRAVENOUS | Status: AC
  Administered 2015-05-20: 500 mL via INTRAVENOUS

## 2015-05-20 MED ORDER — PIPERACILLIN-TAZOBACTAM 3.375 G IVPB
3.3750 g | Freq: Three times a day (TID) | INTRAVENOUS | Status: DC
  Administered 2015-05-20 – 2015-05-22 (×6): 3.375 g via INTRAVENOUS
  Filled 2015-05-20 (×6): qty 50

## 2015-05-20 MED ORDER — ONDANSETRON HCL 4 MG PO TABS: 8.0000 mg | ORAL_TABLET | Freq: Three times a day (TID) | ORAL | Status: DC | PRN

## 2015-05-20 MED ORDER — SODIUM CHLORIDE 0.9 % IV SOLN
1250.0000 mg | INTRAVENOUS | Status: DC
  Filled 2015-05-20: qty 1250

## 2015-05-20 MED ORDER — ENOXAPARIN SODIUM 40 MG/0.4ML ~~LOC~~ SOLN
40.0000 mg | Freq: Every day | SUBCUTANEOUS | Status: DC
  Administered 2015-05-20 – 2015-05-23 (×4): 40 mg via SUBCUTANEOUS
  Filled 2015-05-20 (×4): qty 0.4

## 2015-05-20 MED ORDER — PNEUMOCOCCAL VAC POLYVALENT 25 MCG/0.5ML IJ INJ
0.5000 mL | INJECTION | INTRAMUSCULAR | Status: AC
  Administered 2015-05-21: 0.5 mL via INTRAMUSCULAR
  Filled 2015-05-20 (×2): qty 0.5

## 2015-05-20 MED ORDER — PIPERACILLIN-TAZOBACTAM 3.375 G IVPB 30 MIN
3.3750 g | Freq: Once | INTRAVENOUS | Status: AC
  Administered 2015-05-20: 3.375 g via INTRAVENOUS
  Filled 2015-05-20: qty 50

## 2015-05-20 MED ORDER — DM-GUAIFENESIN ER 30-600 MG PO TB12: 1.0000 | ORAL_TABLET | Freq: Two times a day (BID) | ORAL | Status: DC | PRN

## 2015-05-20 MED ORDER — CARVEDILOL 25 MG PO TABS
25.0000 mg | ORAL_TABLET | Freq: Two times a day (BID) | ORAL | Status: DC
  Administered 2015-05-20 – 2015-05-22 (×5): 25 mg via ORAL
  Filled 2015-05-20 (×5): qty 1

## 2015-05-20 MED ORDER — HYDRALAZINE HCL 20 MG/ML IJ SOLN: 5.0000 mg | INTRAMUSCULAR | Status: DC | PRN

## 2015-05-20 MED ORDER — ONDANSETRON HCL 4 MG/2ML IJ SOLN
4.0000 mg | Freq: Once | INTRAMUSCULAR | Status: AC
  Administered 2015-05-20: 4 mg via INTRAVENOUS
  Filled 2015-05-20: qty 2

## 2015-05-20 MED ORDER — VANCOMYCIN HCL IN DEXTROSE 1-5 GM/200ML-% IV SOLN
1000.0000 mg | Freq: Once | INTRAVENOUS | Status: AC
  Administered 2015-05-20: 1000 mg via INTRAVENOUS
  Filled 2015-05-20: qty 200

## 2015-05-20 MED ORDER — ADULT MULTIVITAMIN W/MINERALS CH
1.0000 | ORAL_TABLET | Freq: Every day | ORAL | Status: DC
  Administered 2015-05-20 – 2015-05-24 (×5): 1 via ORAL
  Filled 2015-05-20 (×5): qty 1

## 2015-05-20 MED ORDER — PRAVASTATIN SODIUM 20 MG PO TABS
10.0000 mg | ORAL_TABLET | Freq: Every day | ORAL | Status: DC
  Administered 2015-05-20 – 2015-05-23 (×4): 10 mg via ORAL
  Filled 2015-05-20 (×4): qty 1

## 2015-05-20 MED ORDER — ACETAMINOPHEN 325 MG PO TABS
650.0000 mg | ORAL_TABLET | Freq: Four times a day (QID) | ORAL | Status: DC | PRN
  Administered 2015-05-20 – 2015-05-22 (×5): 650 mg via ORAL
  Filled 2015-05-20 (×5): qty 2

## 2015-05-20 MED ORDER — METHIMAZOLE 5 MG PO TABS
5.0000 mg | ORAL_TABLET | Freq: Every day | ORAL | Status: DC
  Administered 2015-05-20 – 2015-05-24 (×5): 5 mg via ORAL
  Filled 2015-05-20 (×5): qty 1

## 2015-05-20 MED ORDER — DIPHENHYDRAMINE HCL 25 MG PO CAPS: 25.0000 mg | ORAL_CAPSULE | ORAL | Status: DC | PRN

## 2015-05-20 MED ORDER — ACETAMINOPHEN 325 MG PO TABS
650.0000 mg | ORAL_TABLET | Freq: Once | ORAL | Status: DC
  Filled 2015-05-20: qty 2

## 2015-05-20 MED ORDER — ONDANSETRON HCL 4 MG/2ML IJ SOLN: 4.0000 mg | Freq: Four times a day (QID) | INTRAMUSCULAR | Status: DC | PRN

## 2015-05-20 MED ORDER — ONDANSETRON HCL 4 MG PO TABS
4.0000 mg | ORAL_TABLET | Freq: Four times a day (QID) | ORAL | Status: DC | PRN
  Administered 2015-05-20 – 2015-05-21 (×2): 4 mg via ORAL
  Filled 2015-05-20 (×2): qty 1

## 2015-05-20 MED ORDER — SODIUM CHLORIDE 0.9% FLUSH
3.0000 mL | Freq: Two times a day (BID) | INTRAVENOUS | Status: DC
  Administered 2015-05-20 – 2015-05-23 (×3): 3 mL via INTRAVENOUS

## 2015-05-20 NOTE — Care Management Obs Status (Signed)
[place] NOTIFICATION   Patient Details  Name: Katie Higgins MRN: [id number] Date of Birth: [birth year]   Medicare Observation Status Notification Given:  Yes    Purcell Mouton, RN 05/20/2015, 4:25 PM

## 2015-05-20 NOTE — Progress Notes (Signed)
Pharmacy Antibiotic Note  Katie Higgins is a 65 y.o. female admitted on 05/20/2015 with sepsis.  Pharmacy has been consulted for Vancomycin and Zosyn dosing.  Plan: Vancomycin 1250mg  IV every 24 hours.  Goal trough 15-20 mcg/mL. Zosyn 3.375g IV q8h (4 hour infusion).  Height: 5\' 7"  (170.2 cm) Weight: 259 lb (117.482 kg) IBW/kg (Calculated) : 61.6  Temp (24hrs), Avg:99.9 F (37.7 C), Min:99.1 F (37.3 C), Max:100.6 F (38.1 C)   Recent Labs Lab 05/20/15 0125 05/20/15 0155  WBC 7.9  --   CREATININE 1.55*  --   LATICACIDVEN  --  2.97*    Estimated Creatinine Clearance: 48 mL/min (by C-G formula based on Cr of 1.55).    Allergies  Allergen Reactions  . Amitriptyline Other (See Comments)    Leg cramps     Antimicrobials this admission: Vancomycin 05/20/2015 >> Zosyn 05/20/2015 >>     Microbiology results: Pending  Thank you for allowing pharmacy to be a part of this patient's care.  Nani Skillern Crowford 05/20/2015 6:27 AM

## 2015-05-20 NOTE — Progress Notes (Signed)
PT Cancellation Note / Screen  Patient Details Name: Katie Higgins MRN: [id number] DOB: 12-09-1950   Cancelled Treatment:    Reason Eval/Treat Not Completed: PT screened, no needs identified, will sign off Pt reports she has family that can assist at home if needed.  Pt also reports she is only able to stand less then 5 minutes due to OA of hips and knees as well as back pain.  Pt has manual w/c at home.  Pt declines PT at this time and did not feel PT needed to check back.  PT to sign off per pt request.   York Ram E 05/20/2015, 9:22 AM Carmelia Bake, PT, DPT 05/20/2015 Pager: [phone]

## 2015-05-20 NOTE — ED Notes (Signed)
Writer notified EDP of abnormal I-stat lact

## 2015-05-20 NOTE — ED Notes (Signed)
Pt presents to ED via EMS with c/o flu like symptoms chills, body ache, nausea and vomiting and fever. EMS reports fever T-102. Pt alerts and oriented x4, hard of hearing.

## 2015-05-20 NOTE — Progress Notes (Signed)
[facility] PROGRESS NOTE  Katie Higgins [id number] DOB: February 08, 1950 DOA: 05/20/2015 PCP: Gwendolyn Grant, MD  Assessment/Plan: 1. Possible viral syndrome/infectious gastroenteritis -Katie Higgins presenting with complaints of nausea, vomiting, diarrhea associated with fevers and chills. Overnight she had a MAXIMUM TEMPERATURE of 100.6. -Initial workup unremarkable. -Her diet was advanced today, which she seems to be tolerating. -Will stop broad-spectrum IV antibiotics and monitor, follow-up on blood cultures.  2.  History of diastolic congestive heart failure -Last echo performed on 09/15/2014 which showed preserved ejection fraction with grade 1 diastolic dysfunction. -She is now tolerating by mouth intake, will stop IV fluids -On exam does not appear volume overloaded.  3.  History of hypertension. -Continue Coreg 25 mg by mouth twice a day and monitor her blood pressures.  4.  Cough/shortness of breath -Initial chest x-ray did not reveal acute cardiopulmonary disease. -Flu swab negative -Plan to monitor off of broad spectrum IV antimicrobial therapy, it is possible this could be related to viral syndrome  Code Status: Full code Family Communication: Family not present Disposition Plan:    Antibiotics:  IV Zosyn stopped on 05/20/2015  IV vancomycin stopped on 05/20/2015  HPI/Subjective: Katie Higgins is a pleasant 65 year old female with a history of morbid obesity, diastolic congestive heart failure, stage III chronic kidney disease, admitted to medicine service on 05/20/2015 when she presented with complaints of fevers, chills, nausea, vomiting, diarrhea. Workup included a chest x-ray that did not reveal acute cardiopulmonary disease. Urinalysis negative for Nitrates and Leukocyte Estrace. She was started on empiric IV antibiotic therapy with vancomycin and Zosyn for possible early sepsis.  Objective: Filed Vitals:   05/20/15 0641 05/20/15 0909  BP: 95/76    Pulse: 102   Temp: 99.4 F (37.4 C) 98.3 F (36.8 C)  Resp: 16     Intake/Output Summary (Last 24 hours) at 05/20/15 1401 Last data filed at 05/20/15 1255  Gross per 24 hour  Intake    600 ml  Output      0 ml  Net    600 ml   Filed Weights   05/20/15 0106 05/20/15 0641  Weight: 117.482 kg (259 lb) 124.331 kg (274 lb 1.6 oz)    Exam:   General:  Patient is in no acute distress, sitting at bedside chair having her lunch  Cardiovascular: Regular rate and rhythm normal S1-S2  Respiratory: Overall lungs were clear to auscultation bilaterally no wheezing rhonchi or rales  Abdomen: Obese however soft nontender nondistended  Musculoskeletal: No edema  Data Reviewed: Basic Metabolic Panel:  Recent Labs Lab 05/20/15 0125 05/20/15 0805  NA 141 144  K 5.5* 4.7  CL 107 112*  CO2 22 20*  GLUCOSE 115* 143*  BUN 29* 25*  CREATININE 1.55* 1.48*  CALCIUM 8.7* 7.8*   Liver Function Tests:  Recent Labs Lab 05/20/15 0125  AST 20  ALT 13*  ALKPHOS 70  BILITOT 0.4  PROT 7.2  ALBUMIN 3.8   No results for input(s): LIPASE, AMYLASE in the last 168 hours. No results for input(s): AMMONIA in the last 168 hours. CBC:  Recent Labs Lab 05/20/15 0125  WBC 7.9  NEUTROABS 7.0  HGB 12.0  HCT 35.5*  MCV 86.0  PLT 214   Cardiac Enzymes: No results for input(s): CKTOTAL, CKMB, CKMBINDEX, TROPONINI in the last 168 hours. BNP (last 3 results)  Recent Labs  05/20/15 0554  BNP 93.1    ProBNP (last 3 results) No results for input(s): PROBNP in the last 8760 hours.  CBG: No results for input(s): GLUCAP in the last 168 hours.  No results found for this or any previous visit (from the past 240 hour(s)).   Studies: Dg Chest 2 View  05/20/2015  CLINICAL DATA:  Fever and cough for 48 hours EXAM: CHEST  2 VIEW COMPARISON:  01/14/2014 FINDINGS: The heart size and mediastinal contours are within normal limits. Both lungs are clear. The visualized skeletal structures are  unremarkable. IMPRESSION: No active cardiopulmonary disease. Electronically Signed   By: Andreas Newport M.D.   On: 05/20/2015 03:03    Scheduled Meds: . acetaminophen  650 mg Oral Once  . aspirin EC  81 mg Oral Daily  . carvedilol  25 mg Oral BID  . enoxaparin (LOVENOX) injection  40 mg Subcutaneous Daily  . methimazole  5 mg Oral Daily  . multivitamin with minerals  1 tablet Oral Daily  . piperacillin-tazobactam (ZOSYN)  IV  3.375 g Intravenous Q8H  . [START ON 05/21/2015] pneumococcal 23 valent vaccine  0.5 mL Intramuscular Tomorrow-1000  . pravastatin  10 mg Oral q1800  . sodium chloride flush  3 mL Intravenous Q12H  . vancomycin  1,250 mg Intravenous Q24H   Continuous Infusions: . sodium chloride 75 mL/hr at 05/20/15 [id number]    Principal Problem:   Sepsis ([place]) Active Problems:   Diabetes mellitus type 2, controlled, with complications ([place])   HLD (hyperlipidemia)   Essential hypertension   Morbid obesity (HCC)   Arthritis of left knee   Lumbar radiculopathy   Fever   CKD (chronic kidney disease), stage III   Hyperkalemia   Cough   Nausea vomiting and diarrhea    Time spent:     Kelvin Cellar  [facility] Pager [phone]. If 7PM-7AM, please contact night-coverage at [email or web address], password [facility] 05/20/2015, 2:01 PM

## 2015-05-20 NOTE — H&P (Signed)
[facility] History and Physical  Katie Higgins HEN:[id number] DOB: January 28, 1951 DOA: 05/20/2015  Referring physician: ED physician PCP: Gwendolyn Grant, MD  Specialists:   Chief Complaint: Fever, chills, cough, nausea, vomiting, diarrhea  HPI: Katie Higgins is a 65 y.o. female with PMH of hypertension, hyperlipidemia, morbid obesity, diet controlled diabetes mellitus, gout, obesity, diastolic congestive heart failure, hypothyroidism, chronic kidney disease-stage III, who presents with fever, cough, nausea, vomiting, diarrhea  Patient reports that he started having flulike symptoms since last night, including fever, chills, cough, nausea, vomiting, diarrhea, body aching. He coughs up clear mucus. No chest pain or shortness of breath. She had 3 bowel movements with loose stooling in early morning, but after that she did not have diarrhea. She had mild abdominal discomfort earlier, but no abdominal pain currently. She states that she passes a lot of gas. Patient denies symptoms of UTI. No unilateral weakness.  In ED, patient was found to have lactic acid 2.97, WBC 7.9, temperature 100.6, tachycardia, tachypnea, negative urinalysis, negative chest x-ray for acute abnormalities, potassium 5.5 without daily with beginning, stable renal function. Patients is admitted to inpatient for further eval and treatment and observation.  EKG: Independently reviewed. QTC 445, tachycardia, low voltage, no T-wave peaking.  Where does patient live?   At home  Can patient participate in ADLs?  some   Review of Systems:   General: has fevers, chills, no changes in body weight, has poor appetite, has fatigue HEENT: no blurry vision, hearing changes or sore throat Pulm: no dyspnea, has coughing, no wheezing CV: no chest pain, no palpitations Abd: has nausea, vomiting, diarrhea. No constipation or abdominal pain GU: no dysuria, burning on urination, increased urinary frequency, hematuria  Ext: no leg  edema Neuro: no unilateral weakness, numbness, or tingling, no vision change or hearing loss Skin: no rash MSK: No muscle spasm, no deformity, no limitation of range of movement in spin Heme: No easy bruising.  Travel history: No recent long distant travel.  Allergy:  Allergies  Allergen Reactions  . Amitriptyline Other (See Comments)    Leg cramps     Past Medical History  Diagnosis Date  . Palpitations   . Obesity   . CARDIOMYOPATHY     Nonischemic. Admitted in 3/11 with CHF exac, due to hyperthyroidism. LHC (03/11) showed clean coronaries with EF 40%. Myoview showed EF 38%. Echo was a technically difficult study with mild to moderately decreased EF, mild LVH, mild MR. Repeat echo (9/11) with EF 36-14%, mild diastolic dysfunction (grade I).  . Congestive heart failure, unspecified   . Osteoarthritis     esp L hip, low back  . ALLERGIC RHINITIS   . DIABETES MELLITUS, CONTROLLED   . DYSLIPIDEMIA   . HYPERTENSION   . HYPERTHYROIDISM   . CARPAL TUNNEL SYNDROME, LEFT, MILD   . Graves disease   . CKD (chronic kidney disease), stage III     Past Surgical History  Procedure Laterality Date  . Cholecystectomy    . Remote hernia repair    . Cardiac catheterization  04/19/09    Social History:  reports that she has never smoked. She does not have any smokeless tobacco history on file. She reports that she does not drink alcohol or use illicit drugs.  Family History:  Family History  Problem Relation Age of Onset  . Diabetes Mother   . Breast cancer Mother   . Kidney cancer Father     Kidney Cancer  . Stroke Father  CVA  . Cervical cancer Mother   . Supraventricular tachycardia Sister   . Asthma      family history     Prior to Admission medications   Medication Sig Start Date End Date Taking? Authorizing Provider  aspirin 81 MG tablet Take 81 mg by mouth daily.     Yes Historical Provider, MD  benzonatate (TESSALON) 100 MG capsule Take 1 capsule (100 mg total) by  mouth 2 (two) times daily as needed for cough. 01/15/15  Yes Hoyt Koch, MD  carvedilol (COREG) 25 MG tablet Take 1 tablet (25 mg total) by mouth 2 (two) times daily. 09/09/14  Yes Larey Dresser, MD  colchicine 0.6 MG tablet Take 1 tablet (0.6 mg total) by mouth daily. Patient taking differently: Take 0.6 mg by mouth daily as needed (gout).  02/05/14  Yes Lyndal Pulley, DO  cyclobenzaprine (FLEXERIL) 10 MG tablet Take 1 tablet (10 mg total) by mouth every 8 (eight) hours as needed for muscle spasms. 04/13/15 04/12/16 Yes Hoyt Koch, MD  dextromethorphan-guaiFENesin [facility] DM) 30-600 MG 12hr tablet Take 1 tablet by mouth 2 (two) times daily as needed for cough.   Yes Historical Provider, MD  diphenhydrAMINE (BENADRYL) 25 mg capsule Take 25 mg by mouth every 4 (four) hours as needed. For allergies    Yes Historical Provider, MD  enalapril (VASOTEC) 10 MG tablet Take 1 tablet (10 mg total) by mouth daily. 09/09/14  Yes Larey Dresser, MD  furosemide (LASIX) 20 MG tablet Take 1 tablet (20 mg total) by mouth daily. Patient taking differently: Take 20 mg by mouth daily as needed for fluid or edema.  09/09/14  Yes Larey Dresser, MD  glucose blood test strip Use as instructed with glucometer to test blood sugar at least twice a day. 09/14/14  Yes Hoyt Koch, MD  glucose monitoring kit (FREESTYLE) monitoring kit Use to test blood sugar up to twice per day. DX: E11.09 02/02/15  Yes Rowe Clack, MD  lovastatin (MEVACOR) 10 MG tablet Take 1 tablet (10 mg total) by mouth at bedtime. 09/09/14  Yes Larey Dresser, MD  meloxicam (MOBIC) 15 MG tablet Take 1 tablet (15 mg total) by mouth daily. 04/27/15  Yes Lyndal Pulley, DO  methimazole (TAPAZOLE) 5 MG tablet Take 1 tablet (5 mg total) by mouth daily. 09/14/14  Yes Renato Shin, MD  Multiple Vitamin (MULTIVITAMIN) tablet Take 1 tablet by mouth daily.     Yes Historical Provider, MD  ondansetron (ZOFRAN) 8 MG tablet Take 1 tablet (8 mg  total) by mouth every 8 (eight) hours as needed for nausea or vomiting. 04/28/15  Yes Hoyt Koch, MD  traMADol (ULTRAM) 50 MG tablet Take 1 tablet (50 mg total) by mouth every 6 (six) hours as needed. 03/01/15  Yes Hoyt Koch, MD    Physical Exam: Filed Vitals:   05/20/15 0314 05/20/15 0330 05/20/15 0400 05/20/15 0430  BP: 131/66 118/58 114/54 111/58  Pulse: 112 106 104 106  Temp:      TempSrc:      Resp: 19 18 18 15   Height:      Weight:      SpO2: 97% 97% 96% 96%   General: Not in acute distress HEENT:       Eyes: PERRL, EOMI, no scleral icterus.       ENT: No discharge from the ears and nose, no pharynx injection, no tonsillar enlargement.  Neck: No JVD, no bruit, no mass felt. Heme: No neck lymph node enlargement. Cardiac: S1/S2, RRR, tachycardia, No murmurs, No gallops or rubs. Pulm:  No rales, wheezing, rhonchi or rubs. Abd: Soft, nondistended, nontender, no rebound pain, no organomegaly, BS present. Ext: No pitting leg edema bilaterally. 2+DP/PT pulse bilaterally. Musculoskeletal: No joint deformities, No joint redness or warmth, no limitation of ROM in spin. Skin: No rashes.  Neuro: Alert, oriented X3, cranial nerves II-XII grossly intact, moves all extremities normally. Psych: Patient is not psychotic, no suicidal or hemocidal ideation.  Labs on Admission:  Basic Metabolic Panel:  Recent Labs Lab 05/20/15 0125  NA 141  K 5.5*  CL 107  CO2 22  GLUCOSE 115*  BUN 29*  CREATININE 1.55*  CALCIUM 8.7*   Liver Function Tests:  Recent Labs Lab 05/20/15 0125  AST 20  ALT 13*  ALKPHOS 70  BILITOT 0.4  PROT 7.2  ALBUMIN 3.8   No results for input(s): LIPASE, AMYLASE in the last 168 hours. No results for input(s): AMMONIA in the last 168 hours. CBC:  Recent Labs Lab 05/20/15 0125  WBC 7.9  NEUTROABS 7.0  HGB 12.0  HCT 35.5*  MCV 86.0  PLT 214   Cardiac Enzymes: No results for input(s): CKTOTAL, CKMB, CKMBINDEX, TROPONINI in  the last 168 hours.  BNP (last 3 results) No results for input(s): BNP in the last 8760 hours.  ProBNP (last 3 results) No results for input(s): PROBNP in the last 8760 hours.  CBG: No results for input(s): GLUCAP in the last 168 hours.  Radiological Exams on Admission: Dg Chest 2 View  05/20/2015  CLINICAL DATA:  Fever and cough for 48 hours EXAM: CHEST  2 VIEW COMPARISON:  01/14/2014 FINDINGS: The heart size and mediastinal contours are within normal limits. Both lungs are clear. The visualized skeletal structures are unremarkable. IMPRESSION: No active cardiopulmonary disease. Electronically Signed   By: Andreas Newport M.D.   On: 05/20/2015 03:03    Assessment/Plan Principal Problem:   Sepsis ([place]) Active Problems:   Diabetes mellitus type 2, controlled, with complications ([place])   HLD (hyperlipidemia)   Essential hypertension   Morbid obesity ([facility])   Arthritis of left knee   Lumbar radiculopathy   Fever   CKD (chronic kidney disease), stage III   Hyperkalemia   Cough   Sepsis: Patient is septic on admission with elevated lactate 2.97, tachycardia, fever and tachypnea. Hemodynamically stable. Patient has upper respiratory symptoms and GI symptoms, likely due to viral infection, but cannot completely rule out bacterial infection. Broad antibiotics of vancomycin and Zosyn were started in the emergency room.  -will admit to tele bed for observation -Continue vancomycin and Zosyn -give one dose of Tamiflu empirically 1, pending flu PCR and a respiratory virus panel -will get Procalcitonin and trend lactic acid levels per sepsis protocol. -IVF: 3.5 L of NS bolus in ED, followed by 75 cc/h -f/u Bx and Ux  Cough: CXR negative. Lung is clear. Likely due to upper respiratory virus infection -Mucinex for cough -Pending flu PCR and respiratory virus panel  Nausea, vomiting, diarrhea: Unclear etiology. Likely due to bilateral gastroenteritis. -IV fluids as above -Will check  C. difficile PCR if patient has diarrhea again (not ordered) -prn zofran for nausea  Hyperkalemia: Potassium 5.5 without EKG change. -IV fluid as above, hoping correction by IVF -hold enalapril -Repeated BMP at 10 PM  HLD: Last LDL was 52/80/2/16 -Pravastatin   HTN: -Hold enalapril due to hyperkalemia -hold lasix due  to sepsis -Continue Coreg -IV hydralazine.  CKD (chronic kidney disease), stage III: Stable. Baseline creatinine 1.2-1.6. Her creatinine is 1.55, BUN 29 on admission. -Follow-up renal function by BMP  Lumbar radiculopathy: -Tramadol  Hyperthyroidism: Last TSH was 3.13 on 06/30/08 -Continue home methimazole  Gout: -continue home Colchicine    DVT ppx: SQ Lovenox  Code Status: Full code Family Communication: None at bed side.  Disposition Plan: Admit to inpatient   Date of Service 05/20/2015    Ivor Costa [facility] Pager [phone]  If 7PM-7AM, please contact night-coverage [email or web address] Password [email or web address] 05/20/2015, 5:12 AM

## 2015-05-20 NOTE — ED Provider Notes (Signed)
CSN: [id number]     Arrival date & time 05/20/15  0059 History   By signing my name below, I, Forrestine Him, attest that this documentation has been prepared under the direction and in the presence of Orpah Greek, MD.  Electronically Signed: Forrestine Him, ED Scribe. 05/20/2015. 1:11 AM.   Chief Complaint  Patient presents with  . Chills  . Fever   The history is provided by the patient. No language interpreter was used.    HPI Comments: Katie Higgins brought in by EMS is a 65 y.o. female with a PMHx of HTN who presents to the Emergency Department here for flu like symptoms this evening. Pt reports constant, ongoing chills, myalgias, nausea, vomiting, diarrhea, abdominal pain, cough, and a fever T- 102. No aggravating or alleviating factors. No OTC medications or home remedies attempted prior to arrival. No recent chest pain or shortness of breath. No known allergies to medications.  PCP: Gwendolyn Grant, MD    Past Medical History  Diagnosis Date  . Palpitations   . Obesity   . CARDIOMYOPATHY     Nonischemic. Admitted in 3/11 with CHF exac, due to hyperthyroidism. LHC (03/11) showed clean coronaries with EF 40%. Myoview showed EF 38%. Echo was a technically difficult study with mild to moderately decreased EF, mild LVH, mild MR. Repeat echo (9/11) with EF 17-79%, mild diastolic dysfunction (grade I).  . Congestive heart failure, unspecified   . Osteoarthritis     esp L hip, low back  . ALLERGIC RHINITIS   . DIABETES MELLITUS, CONTROLLED   . DYSLIPIDEMIA   . HYPERTENSION   . HYPERTHYROIDISM   . CARPAL TUNNEL SYNDROME, LEFT, MILD   . Graves disease    Past Surgical History  Procedure Laterality Date  . Cholecystectomy    . Remote hernia repair    . Cardiac catheterization  04/19/09   Family History  Problem Relation Age of Onset  . Diabetes Mother   . Breast cancer Mother   . Kidney cancer Father     Kidney Cancer  . Stroke Father     CVA  . Cervical cancer  Mother   . Supraventricular tachycardia Sister   . Asthma      family history   Social History  Substance Use Topics  . Smoking status: Never Smoker   . Smokeless tobacco: None  . Alcohol Use: No   OB History    No data available     Review of Systems  Constitutional: Positive for fever and chills.  Respiratory: Positive for cough. Negative for shortness of breath.   Cardiovascular: Negative for chest pain.  Gastrointestinal: Positive for nausea, vomiting, abdominal pain and diarrhea.  Musculoskeletal: Positive for myalgias.  Psychiatric/Behavioral: Negative for confusion.  All other systems reviewed and are negative.     Allergies  Review of patient's allergies indicates no known allergies.  Home Medications   Prior to Admission medications   Medication Sig Start Date End Date Taking? Authorizing Provider  aspirin 81 MG tablet Take 81 mg by mouth daily.      Historical Provider, MD  benzonatate (TESSALON) 100 MG capsule Take 1 capsule (100 mg total) by mouth 2 (two) times daily as needed for cough. 01/15/15   Hoyt Koch, MD  carvedilol (COREG) 25 MG tablet Take 1 tablet (25 mg total) by mouth 2 (two) times daily. 09/09/14   Larey Dresser, MD  ciprofloxacin (CIPRO) 250 MG tablet Take 1 tablet (250 mg total) by mouth  2 (two) times daily. 09/25/14   Hoyt Koch, MD  colchicine 0.6 MG tablet Take 1 tablet (0.6 mg total) by mouth daily. 02/05/14   Lyndal Pulley, DO  cyclobenzaprine (FLEXERIL) 10 MG tablet Take 1 tablet (10 mg total) by mouth every 8 (eight) hours as needed for muscle spasms. 04/13/15 04/12/16  Hoyt Koch, MD  diphenhydrAMINE (BENADRYL) 25 mg capsule Take 25 mg by mouth every 4 (four) hours as needed. For allergies     Historical Provider, MD  enalapril (VASOTEC) 10 MG tablet Take 1 tablet (10 mg total) by mouth daily. 09/09/14   Larey Dresser, MD  furosemide (LASIX) 20 MG tablet Take 1 tablet (20 mg total) by mouth daily. 09/09/14   Larey Dresser, MD  glucose blood test strip Use as instructed with glucometer to test blood sugar at least twice a day. 09/14/14   Hoyt Koch, MD  glucose monitoring kit (FREESTYLE) monitoring kit Use to test blood sugar up to twice per day. DX: E11.09 02/02/15   Rowe Clack, MD  lovastatin (MEVACOR) 10 MG tablet Take 1 tablet (10 mg total) by mouth at bedtime. 09/09/14   Larey Dresser, MD  meloxicam (MOBIC) 15 MG tablet Take 1 tablet (15 mg total) by mouth daily. 04/27/15   Lyndal Pulley, DO  methimazole (TAPAZOLE) 5 MG tablet Take 1 tablet (5 mg total) by mouth daily. 09/14/14   Renato Shin, MD  Multiple Vitamin (MULTIVITAMIN) tablet Take 1 tablet by mouth daily.      Historical Provider, MD  ondansetron (ZOFRAN) 8 MG tablet Take 1 tablet (8 mg total) by mouth every 8 (eight) hours as needed for nausea or vomiting. 04/28/15   Hoyt Koch, MD  traMADol (ULTRAM) 50 MG tablet Take 1 tablet (50 mg total) by mouth every 6 (six) hours as needed. 03/01/15   Hoyt Koch, MD   Triage Vitals: BP 127/73 mmHg  Pulse 113  Temp(Src) 100.6 F (38.1 C) (Oral)  Resp 22  Ht 5' 7"  (1.702 m)  Wt 259 lb (117.482 kg)  BMI 40.56 kg/m2  SpO2 99%   Physical Exam  Constitutional: She is oriented to person, place, and time. She appears well-developed and well-nourished. No distress.  HENT:  Head: Normocephalic and atraumatic.  Right Ear: Hearing normal.  Left Ear: Hearing normal.  Nose: Nose normal.  Mouth/Throat: Oropharynx is clear and moist and mucous membranes are normal.  Eyes: Conjunctivae and EOM are normal. Pupils are equal, round, and reactive to light.  Neck: Normal range of motion. Neck supple.  Cardiovascular: Regular rhythm, S1 normal and S2 normal.  Tachycardia present.  Exam reveals no gallop and no friction rub.   No murmur heard. Pulmonary/Chest: Effort normal and breath sounds normal. No respiratory distress. She exhibits no tenderness.  Abdominal: Soft. Normal  appearance and bowel sounds are normal. There is no hepatosplenomegaly. There is tenderness. There is no rebound, no guarding, no tenderness at McBurney's point and negative Murphy's sign. No hernia.  Mild diffuse tenderness of the abdomen   Musculoskeletal: Normal range of motion.  Neurological: She is alert and oriented to person, place, and time. She has normal strength. No cranial nerve deficit or sensory deficit. Coordination normal. GCS eye subscore is 4. GCS verbal subscore is 5. GCS motor subscore is 6.  Skin: Skin is warm, dry and intact. No rash noted. No cyanosis.  Psychiatric: She has a normal mood and affect. Her speech is normal and  behavior is normal. Thought content normal.  Nursing note and vitals reviewed.   ED Course  Procedures (including critical care time)  DIAGNOSTIC STUDIES: Oxygen Saturation is 98% on RA, Normal by my interpretation.    COORDINATION OF CARE: 1:07 AM-Discussed treatment plan with pt at bedside and pt agreed to plan.     Labs Review Labs Reviewed - No data to display  Imaging Review No results found. I have personally reviewed and evaluated these images and lab results as part of my medical decision-making.   EKG Interpretation None      MDM   Final diagnoses:  None  Fever Lactic acidosis  Presents to the ER for evaluation of fever and chills. Patient reports that she has had a cough for several days. Patient was tachycardic and febrile at arrival. Sepsis was considered. Patient did have an elevated lactate. No source has been found, however. This could be viral, influenza is pending. Discussed with the hospitalist, Dr. Blaine Hamper. He did agree that the patient should be hospitalized for initial treatment and following cultures.  I personally performed the services described in this documentation, which was scribed in my presence. The recorded information has been reviewed and is accurate.   Orpah Greek, MD 05/20/15 [phone]

## 2015-05-20 NOTE — ED Notes (Signed)
Bed: [id number] Expected date:  Expected time:  Means of arrival:  Comments: EMS 65 yo female from home with 102 temp

## 2015-05-21 ENCOUNTER — Inpatient Hospital Stay (HOSPITAL_COMMUNITY): Payer: Medicare Other

## 2015-05-21 ENCOUNTER — Encounter (HOSPITAL_COMMUNITY): Payer: Self-pay | Admitting: Radiology

## 2015-05-21 DIAGNOSIS — A4151 Sepsis due to Escherichia coli [E. coli]: Secondary | ICD-10-CM | POA: Diagnosis not present

## 2015-05-21 DIAGNOSIS — R7881 Bacteremia: Secondary | ICD-10-CM

## 2015-05-21 DIAGNOSIS — I10 Essential (primary) hypertension: Secondary | ICD-10-CM

## 2015-05-21 DIAGNOSIS — K5792 Diverticulitis of intestine, part unspecified, without perforation or abscess without bleeding: Secondary | ICD-10-CM

## 2015-05-21 LAB — LACTIC ACID, PLASMA
Lactic Acid, Venous: 0.9 mmol/L (ref 0.5–2.0)
Lactic Acid, Venous: 0.6 mmol/L (ref 0.5–2.0)

## 2015-05-21 LAB — CBC
HEMATOCRIT: 30.9 % — AB (ref 36.0–46.0)
Hemoglobin: 10.2 g/dL — ABNORMAL LOW (ref 12.0–15.0)
MCH: 28.6 pg (ref 26.0–34.0)
MCHC: 33 g/dL (ref 30.0–36.0)
MCV: 86.6 fL (ref 78.0–100.0)
Platelets: 169 10*3/uL (ref 150–400)
RBC: 3.57 MIL/uL — ABNORMAL LOW (ref 3.87–5.11)
RDW: 14.5 % (ref 11.5–15.5)
WBC: 8 10*3/uL (ref 4.0–10.5)

## 2015-05-21 LAB — PROTIME-INR
INR: 1.26 (ref 0.00–1.49)
Prothrombin Time: 16 seconds — ABNORMAL HIGH (ref 11.6–15.2)

## 2015-05-21 LAB — C DIFFICILE QUICK SCREEN W PCR REFLEX
C DIFFICILE (CDIFF) INTERP: NEGATIVE
C DIFFICILE (CDIFF) TOXIN: NEGATIVE
C Diff antigen: NEGATIVE

## 2015-05-21 LAB — COMPREHENSIVE METABOLIC PANEL
ALBUMIN: 2.9 g/dL — AB (ref 3.5–5.0)
ALK PHOS: 57 U/L (ref 38–126)
ALT: 36 U/L (ref 14–54)
AST: 35 U/L (ref 15–41)
Anion gap: 8 (ref 5–15)
BILIRUBIN TOTAL: 1.2 mg/dL (ref 0.3–1.2)
BUN: 23 mg/dL — AB (ref 6–20)
CALCIUM: 7.6 mg/dL — AB (ref 8.9–10.3)
CO2: 19 mmol/L — ABNORMAL LOW (ref 22–32)
Chloride: 109 mmol/L (ref 101–111)
Creatinine, Ser: 1.61 mg/dL — ABNORMAL HIGH (ref 0.44–1.00)
GFR calc Af Amer: 38 mL/min — ABNORMAL LOW (ref >60)
GFR calc non Af Amer: 33 mL/min — ABNORMAL LOW (ref >60)
Glucose, Bld: 115 mg/dL — ABNORMAL HIGH (ref 65–99)
POTASSIUM: 4.3 mmol/L (ref 3.5–5.1)
Sodium: 136 mmol/L (ref 135–145)
TOTAL PROTEIN: 5.8 g/dL — AB (ref 6.5–8.1)

## 2015-05-21 LAB — URINE CULTURE

## 2015-05-21 LAB — ECHOCARDIOGRAM COMPLETE
HEIGHTINCHES: 67 in
Weight: 4385.6 oz

## 2015-05-21 LAB — APTT: APTT: 38 s — AB (ref 24–37)

## 2015-05-21 LAB — PROCALCITONIN: PROCALCITONIN: 62.73 ng/mL

## 2015-05-21 MED ORDER — DIATRIZOATE MEGLUMINE & SODIUM 66-10 % PO SOLN
15.0000 mL | Freq: Once | ORAL | Status: AC
  Administered 2015-05-21: 15 mL via ORAL

## 2015-05-21 MED ORDER — SODIUM CHLORIDE 0.9 % IV BOLUS (SEPSIS)
1000.0000 mL | Freq: Once | INTRAVENOUS | Status: AC
  Administered 2015-05-21: 1000 mL via INTRAVENOUS

## 2015-05-21 MED ORDER — SODIUM CHLORIDE 0.9 % IV SOLN
INTRAVENOUS | Status: DC
  Administered 2015-05-21 – 2015-05-22 (×4): via INTRAVENOUS

## 2015-05-21 NOTE — Progress Notes (Signed)
[facility] PROGRESS NOTE  Katie Higgins [id number] DOB: 12-20-1950 DOA: 05/20/2015 PCP: Katie Grant, MD  Assessment/Plan: 1.  Sepsis -Present on admission, evidenced by positive blood cultures growing gram-negative rods, rate of 116, temperature 101.8, acute renal failure, with possible source of infection from GI tract. She had a CT scan of abdomen and pelvis performed on 05/21/2015 which showed possible early findings of acute diverticulitis. Bacterial translocation is a possibility. -Pending transthoracic echocardiogram -Continue IV Zosyn until blood cultures are finalized -Overnight spiked a temperature 101.8. Plan to repeat blood cultures   2.  Possible early diverticulitis -As part of workup for sepsis she had a CT scan of abdomen and pelvis performed on 05/21/2015 that revealed findings I could be consistent with early acute diverticulitis. -On physical examination she did not have significant pain with palpation -She is being covered with IV Zosyn until blood cultures are resulted.  3.  Acute on chronic renal failure, history of stage III chronic kidney disease -Lab work showing an upward trend in her creatinine to 1.6, appears have baseline creatinine of 1.3-1.4. -Will IV fluid challenge with 1 L bolus of NS -Repeat labs in a.m.  4.  History of diastolic congestive heart failure -Last echo performed on 09/15/2014 which showed preserved ejection fraction with grade 1 diastolic dysfunction. -On exam does not appear volume overloaded.  5.  History of hypertension. -Continue Coreg 25 mg by mouth twice a day and monitor her blood pressures.  6.  Cough/shortness of breath -Initial chest x-ray did not reveal acute cardiopulmonary disease. -Flu swab negative  Code Status: Full code Family Communication: Family not present Disposition Plan:    Antibiotics:  IV Zosyn stopped on 05/20/2015  IV vancomycin stopped on 05/20/2015  HPI/Subjective: Katie Higgins  is a pleasant 65 year old female with a history of morbid obesity, diastolic congestive heart failure, stage III chronic kidney disease, admitted to medicine service on 05/20/2015 when she presented with complaints of fevers, chills, nausea, vomiting, diarrhea. Workup included a chest x-ray that did not reveal acute cardiopulmonary disease. Urinalysis negative for Nitrates and Leukocyte Estrace. She was started on empiric IV antibiotic therapy with vancomycin and Zosyn for possible early sepsis. Blood cultures obtained on 05/20/2015 K back positive for gram-negative rods. She was continued on IV Zosyn. A CT scan abdomen and pelvis ordered on 05/21/2015 revealed changes I could be consistent with early diverticulitis. Bacterial translocation is a possibility. Interestingly she did not have significant pain with palpation on physical exam. Blood cultures were repeated on 05/21/2015.  Objective: Filed Vitals:   05/21/15 1330 05/21/15 1533  BP: 94/50   Pulse: 78   Temp: 98.5 F (36.9 C) 99.2 F (37.3 C)  Resp: 18     Intake/Output Summary (Last 24 hours) at 05/21/15 1607 Last data filed at 05/21/15 1502  Gross per 24 hour  Intake   2190 ml  Output      3 ml  Net   2187 ml   Filed Weights   05/20/15 0106 05/20/15 0641  Weight: 117.482 kg (259 lb) 124.331 kg (274 lb 1.6 oz)    Exam:   General:  Patient is in no acute distress, sitting at bedside chair  Cardiovascular: Regular rate and rhythm normal S1-S2  Respiratory: Overall lungs were clear to auscultation bilaterally no wheezing rhonchi or rales  Abdomen: Obese however soft nontender nondistended  Musculoskeletal: No edema  Data Reviewed: Basic Metabolic Panel:  Recent Labs Lab 05/20/15 0125 05/20/15 0805 05/21/15 0441  NA 141 144  136  K 5.5* 4.7 4.3  CL 107 112* 109  CO2 22 20* 19*  GLUCOSE 115* 143* 115*  BUN 29* 25* 23*  CREATININE 1.55* 1.48* 1.61*  CALCIUM 8.7* 7.8* 7.6*   Liver Function Tests:  Recent  Labs Lab 05/20/15 0125 05/21/15 0441  AST 20 35  ALT 13* 36  ALKPHOS 70 57  BILITOT 0.4 1.2  PROT 7.2 5.8*  ALBUMIN 3.8 2.9*   No results for input(s): LIPASE, AMYLASE in the last 168 hours. No results for input(s): AMMONIA in the last 168 hours. CBC:  Recent Labs Lab 05/20/15 0125 05/21/15 0441  WBC 7.9 8.0  NEUTROABS 7.0  --   HGB 12.0 10.2*  HCT 35.5* 30.9*  MCV 86.0 86.6  PLT 214 169   Cardiac Enzymes: No results for input(s): CKTOTAL, CKMB, CKMBINDEX, TROPONINI in the last 168 hours. BNP (last 3 results)  Recent Labs  05/20/15 0554  BNP 93.1    ProBNP (last 3 results) No results for input(s): PROBNP in the last 8760 hours.  CBG: No results for input(s): GLUCAP in the last 168 hours.  Recent Results (from the past 240 hour(s))  Culture, blood (Routine X 2) w Reflex to ID Panel     Status: Abnormal (Preliminary result)   Collection Time: 05/20/15  1:24 AM  Result Value Ref Range Status   Specimen Description BLOOD RIGHT ANTECUBITAL  Final   Special Requests BOTTLES DRAWN AEROBIC AND ANAEROBIC 5CC  Final   Culture  Setup Time   Final    GRAM NEGATIVE RODS IN BOTH AEROBIC AND ANAEROBIC BOTTLES CRITICAL RESULT CALLED TO, READ BACK BY AND VERIFIED WITH: Erline Hau RN 17:50 05/20/15 ([email or web address]) Performed at [facility] (A)  Final   Report Status PENDING  Incomplete  Culture, blood (Routine X 2) w Reflex to ID Panel     Status: Abnormal (Preliminary result)   Collection Time: 05/20/15  1:32 AM  Result Value Ref Range Status   Specimen Description BLOOD LEFT ANTECUBITAL  Final   Special Requests BOTTLES DRAWN AEROBIC AND ANAEROBIC 5CC  Final   Culture  Setup Time   Final    GRAM NEGATIVE RODS ANAEROBIC BOTTLE ONLY CRITICAL RESULT CALLED TO, READ BACK BY AND VERIFIED WITH: Erline Hau RN 17:50 05/19/12 ([email or web address])    Culture ESCHERICHIA COLI (A)  Final   Report Status PENDING  Incomplete  Urine culture     Status: None    Collection Time: 05/20/15  3:08 AM  Result Value Ref Range Status   Specimen Description URINE, CLEAN CATCH  Final   Special Requests NONE  Final   Culture MULTIPLE SPECIES PRESENT, SUGGEST RECOLLECTION  Final   Report Status 05/21/2015 FINAL  Final     Studies: Ct Abdomen Pelvis Wo Contrast  05/21/2015  CLINICAL DATA:  Fever, chills, cough. EXAM: CT ABDOMEN AND PELVIS WITHOUT CONTRAST TECHNIQUE: Multidetector CT imaging of the abdomen and pelvis was performed following the standard protocol without IV contrast. COMPARISON:  01/21/2013 FINDINGS: Lower chest: Lung bases are clear. No effusions. Heart is normal size. Scattered coronary artery calcifications in the right coronary artery. Hepatobiliary: Prior cholecystectomy. Unremarkable unenhanced appearance Pancreas: Unremarkable unenhanced appearance Spleen: Unremarkable unenhanced appearance Adrenals/Urinary Tract: No adrenal abnormality. No focal renal abnormality. No stones or hydronephrosis. Urinary bladder is unremarkable. Stomach/Bowel: Sigmoid diverticulosis. Subtle haziness around areas of the sigmoid colon are noted, best seen on the coronal reconstructed images. This could reflect early changes of active  diverticulitis. Appendix is normal. Stomach and small bowel decompressed. Vascular/Lymphatic: No evidence of aneurysm or adenopathy. Reproductive: Uterus and adnexa unremarkable. No mass. Bilateral tubal ligation clips noted. Other: No free fluid or free air. Musculoskeletal: No acute bony abnormality or focal bone lesion. IMPRESSION: Sigmoid diverticulosis. Subtle haziness noted around areas of the sigmoid colon could reflect early active diverticulitis. Recommend clinical correlation. Normal appendix. Prior cholecystectomy. Coronary artery calcifications. Electronically Signed   By: Rolm Baptise M.D.   On: 05/21/2015 10:47   Dg Chest 2 View  05/20/2015  CLINICAL DATA:  Fever and cough for 48 hours EXAM: CHEST  2 VIEW COMPARISON:  01/14/2014  FINDINGS: The heart size and mediastinal contours are within normal limits. Both lungs are clear. The visualized skeletal structures are unremarkable. IMPRESSION: No active cardiopulmonary disease. Electronically Signed   By: Andreas Newport M.D.   On: 05/20/2015 03:03   Dg Chest Port 1 View  05/21/2015  CLINICAL DATA:  Sepsis with fever and chills EXAM: PORTABLE CHEST 1 VIEW COMPARISON:  05/20/2015 FINDINGS: Normal heart size and mediastinal contours. Stable prominence of basilar lung markings. No acute infiltrate or edema. No effusion or pneumothorax. No acute osseous findings. IMPRESSION: No change or convincing pneumonia. Electronically Signed   By: Monte Fantasia M.D.   On: 05/21/2015 07:54    Scheduled Meds: . acetaminophen  650 mg Oral Once  . aspirin EC  81 mg Oral Daily  . carvedilol  25 mg Oral BID  . enoxaparin (LOVENOX) injection  40 mg Subcutaneous Daily  . methimazole  5 mg Oral Daily  . multivitamin with minerals  1 tablet Oral Daily  . piperacillin-tazobactam (ZOSYN)  IV  3.375 g Intravenous Q8H  . pravastatin  10 mg Oral q1800  . sodium chloride flush  3 mL Intravenous Q12H   Continuous Infusions: . sodium chloride 75 mL/hr at 05/21/15 1502    Principal Problem:   Sepsis ([place]) Active Problems:   Diabetes mellitus type 2, controlled, with complications ([place])   HLD (hyperlipidemia)   Essential hypertension   Morbid obesity (H[facility])   Arthritis of left knee   Lumbar radiculopathy   Fever   CKD (chronic kidney disease), stage III   Hyperkalemia   Cough   Nausea vomiting and diarrhea   Viral syndrome    Time spent: 35 min    Kelvin Cellar  [facility] Pager [phone]. If 7PM-7AM, please contact night-coverage at [email or web address], password [facility] 05/21/2015, 4:07 PM  LOS: 1 day

## 2015-05-21 NOTE — Progress Notes (Signed)
*  PRELIMINARY RESULTS* Echocardiogram 2D Echocardiogram has been performed.  Leavy Cella 05/21/2015, 1:35 PM

## 2015-05-21 NOTE — Plan of Care (Signed)
Problem: Education: Goal: Knowledge of [facility] Education information/materials will improve Outcome: Progressing Education on medications Antibiotic side effects, purpose, and dose frequency. Pt verbalized understanding.

## 2015-05-21 NOTE — Progress Notes (Signed)
Pt was c/o of chills and tremors. Contacted on call MD Rogue Bussing to make aware of patient complaint. No action to take at this time. Pt is anxious and encouraged pt to try and rest warmed room provided warm blanket and pt was hemodynamically stable, remains afebrile.

## 2015-05-22 DIAGNOSIS — N183 Chronic kidney disease, stage 3 (moderate): Secondary | ICD-10-CM

## 2015-05-22 LAB — BASIC METABOLIC PANEL
ANION GAP: 8 (ref 5–15)
BUN: 20 mg/dL (ref 6–20)
CALCIUM: 8.1 mg/dL — AB (ref 8.9–10.3)
CO2: 22 mmol/L (ref 22–32)
Chloride: 111 mmol/L (ref 101–111)
Creatinine, Ser: 1.49 mg/dL — ABNORMAL HIGH (ref 0.44–1.00)
GFR, EST AFRICAN AMERICAN: 41 mL/min — AB (ref >60)
GFR, EST NON AFRICAN AMERICAN: 36 mL/min — AB (ref >60)
GLUCOSE: 92 mg/dL (ref 65–99)
Potassium: 4.5 mmol/L (ref 3.5–5.1)
SODIUM: 141 mmol/L (ref 135–145)

## 2015-05-22 LAB — CBC
HCT: 29.9 % — ABNORMAL LOW (ref 36.0–46.0)
HEMOGLOBIN: 9.8 g/dL — AB (ref 12.0–15.0)
MCH: 28.2 pg (ref 26.0–34.0)
MCHC: 32.8 g/dL (ref 30.0–36.0)
MCV: 86.2 fL (ref 78.0–100.0)
Platelets: 150 10*3/uL (ref 150–400)
RBC: 3.47 MIL/uL — ABNORMAL LOW (ref 3.87–5.11)
RDW: 14.7 % (ref 11.5–15.5)
WBC: 5.6 10*3/uL (ref 4.0–10.5)

## 2015-05-22 LAB — CULTURE, BLOOD (ROUTINE X 2)

## 2015-05-22 MED ORDER — CARVEDILOL 6.25 MG PO TABS: 6.2500 mg | ORAL_TABLET | Freq: Two times a day (BID) | ORAL | Status: DC

## 2015-05-22 MED ORDER — SODIUM CHLORIDE 0.9 % IV SOLN
1.0000 g | Freq: Four times a day (QID) | INTRAVENOUS | Status: DC
  Administered 2015-05-22 – 2015-05-24 (×7): 1 g via INTRAVENOUS
  Filled 2015-05-22 (×8): qty 1000

## 2015-05-22 NOTE — Progress Notes (Signed)
[facility] PROGRESS NOTE  VERLA BALINGIT [id number] DOB: 1950-11-22 DOA: 05/20/2015 PCP: Gwendolyn Grant, MD  Assessment/Plan: 1.  Sepsis -Present on admission, evidenced by positive blood cultures growing gram-negative rods, rate of 116, temperature 101.8, acute renal failure, with possible source of infection from GI tract. She had a CT scan of abdomen and pelvis performed on 05/21/2015 which showed possible early findings of acute diverticulitis. Bacterial translocation is a possibility. -Transthoracic echocardiogram performed 05/20/2068 did not show evidence of endocarditis -Microbiology reporting Escherichia coli growing from blood cultures. Organism is pan susceptible. -Plan to narrow antimicrobial therapy to ampicillin -She remained afebrile overnight -Repeat blood cultures obtained on 05/21/2015 showing no growth after overnight incubation  2.  Possible early diverticulitis -As part of workup for sepsis she had a CT scan of abdomen and pelvis performed on 05/21/2015 that revealed findings I could be consistent with early acute diverticulitis. -On physical examination she did not have significant pain with palpation -Change to IV ampicillin on 05/22/2015  3.  Acute on chronic renal failure, history of stage III chronic kidney disease -Lab work showing an upward trend in her creatinine to 1.6, appears have baseline creatinine of 1.3-1.4. -On 05/22/2015 lab work showing stable creatinine of 1.49  4.  History of diastolic congestive heart failure -Last echo performed on 09/15/2014 which showed preserved ejection fraction with grade 1 diastolic dysfunction. -On exam does not appear volume overloaded.  5.  History of hypertension. -Blood pressures in the low normal range, will hold her Coreg for now  6.  Cough/shortness of breath -Initial chest x-ray did not reveal acute cardiopulmonary disease. -Flu swab negative  Code Status: Full code Family Communication: Family  not present Disposition Plan:    Antibiotics:  IV Zosyn stopped on 05/22/2015  IV vancomycin stopped on 05/20/2015  Ampicillin started on 05/22/2015  HPI/Subjective: Mrs Dunaway is a pleasant 65 year old female with a history of morbid obesity, diastolic congestive heart failure, stage III chronic kidney disease, admitted to medicine service on 05/20/2015 when she presented with complaints of fevers, chills, nausea, vomiting, diarrhea. Workup included a chest x-ray that did not reveal acute cardiopulmonary disease. Urinalysis negative for Nitrates and Leukocyte Estrace. She was started on empiric IV antibiotic therapy with vancomycin and Zosyn for possible early sepsis. Blood cultures obtained on 05/20/2015 K back positive for gram-negative rods. She was continued on IV Zosyn. A CT scan abdomen and pelvis ordered on 05/21/2015 revealed changes I could be consistent with early diverticulitis. Bacterial translocation is a possibility. Interestingly she did not have significant pain with palpation on physical exam. Blood cultures were repeated on 05/21/2015.  Objective: Filed Vitals:   05/22/15 1312 05/22/15 1436  BP:  107/61  Pulse:  47  Temp: 98.5 F (36.9 C) 98.9 F (37.2 C)  Resp:  20    Intake/Output Summary (Last 24 hours) at 05/22/15 1533 Last data filed at 05/22/15 1437  Gross per 24 hour  Intake   3250 ml  Output    526 ml  Net   2724 ml   Filed Weights   05/20/15 0106 05/20/15 0641 05/22/15 0719  Weight: 117.482 kg (259 lb) 124.331 kg (274 lb 1.6 oz) 123.4 kg (272 lb 0.8 oz)    Exam:   General:  Patient is in no acute distress, sitting at bedside chair  Cardiovascular: Regular rate and rhythm normal S1-S2  Respiratory: Overall lungs were clear to auscultation bilaterally no wheezing rhonchi or rales  Abdomen: Obese however soft nontender nondistended  Musculoskeletal:  No edema  Data Reviewed: Basic Metabolic Panel:  Recent Labs Lab 05/20/15 0125  05/20/15 0805 05/21/15 0441 05/22/15 0432  NA 141 144 136 141  K 5.5* 4.7 4.3 4.5  CL 107 112* 109 111  CO2 22 20* 19* 22  GLUCOSE 115* 143* 115* 92  BUN 29* 25* 23* 20  CREATININE 1.55* 1.48* 1.61* 1.49*  CALCIUM 8.7* 7.8* 7.6* 8.1*   Liver Function Tests:  Recent Labs Lab 05/20/15 0125 05/21/15 0441  AST 20 35  ALT 13* 36  ALKPHOS 70 57  BILITOT 0.4 1.2  PROT 7.2 5.8*  ALBUMIN 3.8 2.9*   No results for input(s): LIPASE, AMYLASE in the last 168 hours. No results for input(s): AMMONIA in the last 168 hours. CBC:  Recent Labs Lab 05/20/15 0125 05/21/15 0441 05/22/15 0432  WBC 7.9 8.0 5.6  NEUTROABS 7.0  --   --   HGB 12.0 10.2* 9.8*  HCT 35.5* 30.9* 29.9*  MCV 86.0 86.6 86.2  PLT 214 169 150   Cardiac Enzymes: No results for input(s): CKTOTAL, CKMB, CKMBINDEX, TROPONINI in the last 168 hours. BNP (last 3 results)  Recent Labs  05/20/15 0554  BNP 93.1    ProBNP (last 3 results) No results for input(s): PROBNP in the last 8760 hours.  CBG: No results for input(s): GLUCAP in the last 168 hours.  Recent Results (from the past 240 hour(s))  Culture, blood (Routine X 2) w Reflex to ID Panel     Status: Abnormal   Collection Time: 05/20/15  1:24 AM  Result Value Ref Range Status   Specimen Description BLOOD RIGHT ANTECUBITAL  Final   Special Requests BOTTLES DRAWN AEROBIC AND ANAEROBIC 5CC  Final   Culture  Setup Time   Final    GRAM NEGATIVE RODS IN BOTH AEROBIC AND ANAEROBIC BOTTLES CRITICAL RESULT CALLED TO, READ BACK BY AND VERIFIED WITH: Erline Hau RN 17:50 05/20/15 ([email or web address]) Performed at [facility] (A)  Final   Report Status 05/22/2015 FINAL  Final   Organism ID, Bacteria ESCHERICHIA COLI  Final      Susceptibility   Escherichia coli - MIC*    AMPICILLIN <=2 SENSITIVE Sensitive     CEFAZOLIN <=4 SENSITIVE Sensitive     CEFEPIME <=1 SENSITIVE Sensitive     CEFTAZIDIME <=1 SENSITIVE Sensitive     CEFTRIAXONE  <=1 SENSITIVE Sensitive     CIPROFLOXACIN <=0.25 SENSITIVE Sensitive     GENTAMICIN <=1 SENSITIVE Sensitive     IMIPENEM <=0.25 SENSITIVE Sensitive     TRIMETH/SULFA <=20 SENSITIVE Sensitive     AMPICILLIN/SULBACTAM <=2 SENSITIVE Sensitive     PIP/TAZO <=4 SENSITIVE Sensitive     * ESCHERICHIA COLI  Culture, blood (Routine X 2) w Reflex to ID Panel     Status: Abnormal (Preliminary result)   Collection Time: 05/20/15  1:32 AM  Result Value Ref Range Status   Specimen Description BLOOD LEFT ANTECUBITAL  Final   Special Requests BOTTLES DRAWN AEROBIC AND ANAEROBIC 5CC  Final   Culture  Setup Time   Final    GRAM NEGATIVE RODS ANAEROBIC BOTTLE ONLY CRITICAL RESULT CALLED TO, READ BACK BY AND VERIFIED WITH: Erline Hau RN 17:50 05/19/12 ([email or web address])    Culture (A)  Final    ESCHERICHIA COLI SUSCEPTIBILITIES PERFORMED ON PREVIOUS CULTURE WITHIN THE LAST 5 DAYS. Performed at [facility]    Report Status PENDING  Incomplete  Urine culture     Status: None  Collection Time: 05/20/15  3:08 AM  Result Value Ref Range Status   Specimen Description URINE, CLEAN CATCH  Final   Special Requests NONE  Final   Culture MULTIPLE SPECIES PRESENT, SUGGEST RECOLLECTION  Final   Report Status 05/21/2015 FINAL  Final  C difficile quick scan w PCR reflex     Status: None   Collection Time: 05/21/15  5:00 PM  Result Value Ref Range Status   C Diff antigen NEGATIVE NEGATIVE Final   C Diff toxin NEGATIVE NEGATIVE Final   C Diff interpretation Negative for toxigenic C. difficile  Final     Studies: Ct Abdomen Pelvis Wo Contrast  05/21/2015  CLINICAL DATA:  Fever, chills, cough. EXAM: CT ABDOMEN AND PELVIS WITHOUT CONTRAST TECHNIQUE: Multidetector CT imaging of the abdomen and pelvis was performed following the standard protocol without IV contrast. COMPARISON:  01/21/2013 FINDINGS: Lower chest: Lung bases are clear. No effusions. Heart is normal size. Scattered coronary artery calcifications in the  right coronary artery. Hepatobiliary: Prior cholecystectomy. Unremarkable unenhanced appearance Pancreas: Unremarkable unenhanced appearance Spleen: Unremarkable unenhanced appearance Adrenals/Urinary Tract: No adrenal abnormality. No focal renal abnormality. No stones or hydronephrosis. Urinary bladder is unremarkable. Stomach/Bowel: Sigmoid diverticulosis. Subtle haziness around areas of the sigmoid colon are noted, best seen on the coronal reconstructed images. This could reflect early changes of active diverticulitis. Appendix is normal. Stomach and small bowel decompressed. Vascular/Lymphatic: No evidence of aneurysm or adenopathy. Reproductive: Uterus and adnexa unremarkable. No mass. Bilateral tubal ligation clips noted. Other: No free fluid or free air. Musculoskeletal: No acute bony abnormality or focal bone lesion. IMPRESSION: Sigmoid diverticulosis. Subtle haziness noted around areas of the sigmoid colon could reflect early active diverticulitis. Recommend clinical correlation. Normal appendix. Prior cholecystectomy. Coronary artery calcifications. Electronically Signed   By: Rolm Baptise M.D.   On: 05/21/2015 10:47   Dg Chest Port 1 View  05/21/2015  CLINICAL DATA:  Sepsis with fever and chills EXAM: PORTABLE CHEST 1 VIEW COMPARISON:  05/20/2015 FINDINGS: Normal heart size and mediastinal contours. Stable prominence of basilar lung markings. No acute infiltrate or edema. No effusion or pneumothorax. No acute osseous findings. IMPRESSION: No change or convincing pneumonia. Electronically Signed   By: Monte Fantasia M.D.   On: 05/21/2015 07:54    Scheduled Meds: . acetaminophen  650 mg Oral Once  . aspirin EC  81 mg Oral Daily  . carvedilol  25 mg Oral BID  . enoxaparin (LOVENOX) injection  40 mg Subcutaneous Daily  . methimazole  5 mg Oral Daily  . multivitamin with minerals  1 tablet Oral Daily  . piperacillin-tazobactam (ZOSYN)  IV  3.375 g Intravenous Q8H  . pravastatin  10 mg Oral q1800   . sodium chloride flush  3 mL Intravenous Q12H   Continuous Infusions: . sodium chloride 75 mL/hr at 05/22/15 1313    Principal Problem:   Sepsis ([place]) Active Problems:   Diabetes mellitus type 2, controlled, with complications ([place])   HLD (hyperlipidemia)   Essential hypertension   Morbid obesity (HCC)   Arthritis of left knee   Lumbar radiculopathy   Fever   CKD (chronic kidney disease), stage III   Hyperkalemia   Cough   Nausea vomiting and diarrhea   Viral syndrome    Time spent: 35 min    Kelvin Cellar  [facility] Pager [phone]. If 7PM-7AM, please contact night-coverage at [email or web address], password [facility] 05/22/2015, 3:33 PM  LOS: 2 days

## 2015-05-23 LAB — CULTURE, BLOOD (ROUTINE X 2)

## 2015-05-23 NOTE — Progress Notes (Signed)
[facility] PROGRESS NOTE  Katie Higgins [id number] DOB: 1950/03/30 DOA: 05/20/2015 PCP: Gwendolyn Grant, MD  Assessment/Plan: 1.  Sepsis -Present on admission, evidenced by positive blood cultures growing gram-negative rods, rate of 116, temperature 101.8, acute renal failure, with possible source of infection from GI tract. She had a CT scan of abdomen and pelvis performed on 05/21/2015 which showed possible early findings of acute diverticulitis. Bacterial translocation is a possibility. -Transthoracic echocardiogram performed 05/20/2068 did not show evidence of endocarditis -Microbiology reporting Escherichia coli growing from blood cultures. Organism is pan susceptible. -Narrowed antimicrobial therapy to ampicillin -She remained afebrile overnight -Repeat blood cultures obtained on 05/21/2015 showing no growth after overnight incubation -Will watch for the next 24 hours and if repeat blood cultures remain negative and she is afebrile will discharge home.  2.  Possible early diverticulitis -As part of workup for sepsis she had a CT scan of abdomen and pelvis performed on 05/21/2015 that revealed findings I could be consistent with early acute diverticulitis. -On physical examination she did not have significant pain with palpation -Change to IV ampicillin on 05/22/2015  3.  Acute on chronic renal failure, history of stage III chronic kidney disease -Lab work showing an upward trend in her creatinine to 1.6, appears have baseline creatinine of 1.3-1.4. -On 05/22/2015 lab work showing stable creatinine of 1.49  4.  History of diastolic congestive heart failure -Last echo performed on 09/15/2014 which showed preserved ejection fraction with grade 1 diastolic dysfunction. -On exam does not appear volume overloaded.  5.  History of hypertension. -Blood pressures in the low normal range for which Coreg was held  6.  Cough/shortness of breath -Initial chest x-ray did not  reveal acute cardiopulmonary disease. -Flu swab negative  Code Status: Full code Family Communication: Family not present Disposition Plan: Plan to discharge patient to the next 24 hours patient remains afebrile and repeat blood cultures remain negative.   Antibiotics:  IV Zosyn stopped on 05/22/2015  IV vancomycin stopped on 05/20/2015  Ampicillin started on 05/22/2015  HPI/Subjective: Katie Higgins is a pleasant 65 year old female with a history of morbid obesity, diastolic congestive heart failure, stage III chronic kidney disease, admitted to medicine service on 05/20/2015 when she presented with complaints of fevers, chills, nausea, vomiting, diarrhea. Workup included a chest x-ray that did not reveal acute cardiopulmonary disease. Urinalysis negative for Nitrates and Leukocyte Estrace. She was started on empiric IV antibiotic therapy with vancomycin and Zosyn for possible early sepsis. Blood cultures obtained on 05/20/2015 K back positive for gram-negative rods. She was continued on IV Zosyn. A CT scan abdomen and pelvis ordered on 05/21/2015 revealed changes I could be consistent with early diverticulitis. Bacterial translocation is a possibility. Interestingly she did not have significant pain with palpation on physical exam. Blood cultures were repeated on 05/21/2015.  Objective: Filed Vitals:   05/22/15 1956 05/23/15 0552  BP: 113/63 114/70  Pulse: 80 63  Temp: 98.4 F (36.9 C) 98 F (36.7 C)  Resp: 18 20    Intake/Output Summary (Last 24 hours) at 05/23/15 1514 Last data filed at 05/23/15 0552  Gross per 24 hour  Intake   1500 ml  Output    200 ml  Net   1300 ml   Filed Weights   05/20/15 0641 05/22/15 0719 05/23/15 0552  Weight: 124.331 kg (274 lb 1.6 oz) 123.4 kg (272 lb 0.8 oz) 126.644 kg (279 lb 3.2 oz)    Exam:   General:  Patient is in no  acute distress  Cardiovascular: Regular rate and rhythm normal S1-S2  Respiratory: Overall lungs were clear to  auscultation bilaterally no wheezing rhonchi or rales  Abdomen: Obese however soft nontender nondistended  Musculoskeletal: No edema  Data Reviewed: Basic Metabolic Panel:  Recent Labs Lab 05/20/15 0125 05/20/15 0805 05/21/15 0441 05/22/15 0432  NA 141 144 136 141  K 5.5* 4.7 4.3 4.5  CL 107 112* 109 111  CO2 22 20* 19* 22  GLUCOSE 115* 143* 115* 92  BUN 29* 25* 23* 20  CREATININE 1.55* 1.48* 1.61* 1.49*  CALCIUM 8.7* 7.8* 7.6* 8.1*   Liver Function Tests:  Recent Labs Lab 05/20/15 0125 05/21/15 0441  AST 20 35  ALT 13* 36  ALKPHOS 70 57  BILITOT 0.4 1.2  PROT 7.2 5.8*  ALBUMIN 3.8 2.9*   No results for input(s): LIPASE, AMYLASE in the last 168 hours. No results for input(s): AMMONIA in the last 168 hours. CBC:  Recent Labs Lab 05/20/15 0125 05/21/15 0441 05/22/15 0432  WBC 7.9 8.0 5.6  NEUTROABS 7.0  --   --   HGB 12.0 10.2* 9.8*  HCT 35.5* 30.9* 29.9*  MCV 86.0 86.6 86.2  PLT 214 169 150   Cardiac Enzymes: No results for input(s): CKTOTAL, CKMB, CKMBINDEX, TROPONINI in the last 168 hours. BNP (last 3 results)  Recent Labs  05/20/15 0554  BNP 93.1    ProBNP (last 3 results) No results for input(s): PROBNP in the last 8760 hours.  CBG: No results for input(s): GLUCAP in the last 168 hours.  Recent Results (from the past 240 hour(s))  Culture, blood (Routine X 2) w Reflex to ID Panel     Status: Abnormal   Collection Time: 05/20/15  1:24 AM  Result Value Ref Range Status   Specimen Description BLOOD RIGHT ANTECUBITAL  Final   Special Requests BOTTLES DRAWN AEROBIC AND ANAEROBIC 5CC  Final   Culture  Setup Time   Final    GRAM NEGATIVE RODS IN BOTH AEROBIC AND ANAEROBIC BOTTLES CRITICAL RESULT CALLED TO, READ BACK BY AND VERIFIED WITH: Erline Hau RN 17:50 05/20/15 ([email or web address]) Performed at [facility] (A)  Final   Report Status 05/22/2015 FINAL  Final   Organism ID, Bacteria ESCHERICHIA COLI  Final       Susceptibility   Escherichia coli - MIC*    AMPICILLIN <=2 SENSITIVE Sensitive     CEFAZOLIN <=4 SENSITIVE Sensitive     CEFEPIME <=1 SENSITIVE Sensitive     CEFTAZIDIME <=1 SENSITIVE Sensitive     CEFTRIAXONE <=1 SENSITIVE Sensitive     CIPROFLOXACIN <=0.25 SENSITIVE Sensitive     GENTAMICIN <=1 SENSITIVE Sensitive     IMIPENEM <=0.25 SENSITIVE Sensitive     TRIMETH/SULFA <=20 SENSITIVE Sensitive     AMPICILLIN/SULBACTAM <=2 SENSITIVE Sensitive     PIP/TAZO <=4 SENSITIVE Sensitive     * ESCHERICHIA COLI  Culture, blood (Routine X 2) w Reflex to ID Panel     Status: Abnormal   Collection Time: 05/20/15  1:32 AM  Result Value Ref Range Status   Specimen Description BLOOD LEFT ANTECUBITAL  Final   Special Requests BOTTLES DRAWN AEROBIC AND ANAEROBIC 5CC  Final   Culture  Setup Time   Final    GRAM NEGATIVE RODS ANAEROBIC BOTTLE ONLY CRITICAL RESULT CALLED TO, READ BACK BY AND VERIFIED WITH: Erline Hau RN 17:50 05/19/12 ([email or web address])    Culture (A)  Final    ESCHERICHIA COLI SUSCEPTIBILITIES  PERFORMED ON PREVIOUS CULTURE WITHIN THE LAST 5 DAYS. Performed at [facility]    Report Status 05/23/2015 FINAL  Final  Urine culture     Status: None   Collection Time: 05/20/15  3:08 AM  Result Value Ref Range Status   Specimen Description URINE, CLEAN CATCH  Final   Special Requests NONE  Final   Culture MULTIPLE SPECIES PRESENT, SUGGEST RECOLLECTION  Final   Report Status 05/21/2015 FINAL  Final  Culture, blood (Routine X 2) w Reflex to ID Panel     Status: None (Preliminary result)   Collection Time: 05/21/15  4:31 PM  Result Value Ref Range Status   Specimen Description BLOOD RIGHT ANTECUBITAL  Final   Special Requests BOTTLES DRAWN AEROBIC AND ANAEROBIC 5 CC EA  Final   Culture   Final    NO GROWTH < 24 HOURS Performed at [facility]    Report Status PENDING  Incomplete  Culture, blood (Routine X 2) w Reflex to ID Panel     Status: None (Preliminary result)    Collection Time: 05/21/15  4:31 PM  Result Value Ref Range Status   Specimen Description BLOOD RIGHT HAND  Final   Special Requests IN PEDIATRIC BOTTLE 1.5 CC  Final   Culture   Final    NO GROWTH < 24 HOURS Performed at [facility]    Report Status PENDING  Incomplete  C difficile quick scan w PCR reflex     Status: None   Collection Time: 05/21/15  5:00 PM  Result Value Ref Range Status   C Diff antigen NEGATIVE NEGATIVE Final   C Diff toxin NEGATIVE NEGATIVE Final   C Diff interpretation Negative for toxigenic C. difficile  Final     Studies: No results found.  Scheduled Meds: . acetaminophen  650 mg Oral Once  . ampicillin (OMNIPEN) IV  1 g Intravenous 4 times per day  . aspirin EC  81 mg Oral Daily  . enoxaparin (LOVENOX) injection  40 mg Subcutaneous Daily  . methimazole  5 mg Oral Daily  . multivitamin with minerals  1 tablet Oral Daily  . pravastatin  10 mg Oral q1800  . sodium chloride flush  3 mL Intravenous Q12H   Continuous Infusions:    Principal Problem:   Sepsis ([place]) Active Problems:   Diabetes mellitus type 2, controlled, with complications ([place])   HLD (hyperlipidemia)   Essential hypertension   Morbid obesity ([facility])   Arthritis of left knee   Lumbar radiculopathy   Fever   CKD (chronic kidney disease), stage III   Hyperkalemia   Cough   Nausea vomiting and diarrhea   Viral syndrome    Time spent: 15 min    Kelvin Cellar  [facility] Pager [phone]. If 7PM-7AM, please contact night-coverage at [email or web address], password [facility] 05/23/2015, 3:14 PM  LOS: 3 days

## 2015-05-24 ENCOUNTER — Ambulatory Visit: Payer: Medicare Other | Admitting: Endocrinology

## 2015-05-24 ENCOUNTER — Telehealth: Payer: Self-pay | Admitting: Endocrinology

## 2015-05-24 MED ORDER — AMOXICILLIN-POT CLAVULANATE 875-125 MG PO TABS: 1.0000 | ORAL_TABLET | Freq: Two times a day (BID) | ORAL | Status: DC

## 2015-05-24 MED ORDER — CARVEDILOL 12.5 MG PO TABS: 12.5000 mg | ORAL_TABLET | Freq: Two times a day (BID) | ORAL | Status: DC

## 2015-05-24 NOTE — Telephone Encounter (Signed)
Patient has been admitted to the hospital need to cancel appt for Monday.

## 2015-05-24 NOTE — Telephone Encounter (Signed)
Noted  

## 2015-05-25 ENCOUNTER — Telehealth: Payer: Self-pay

## 2015-05-25 NOTE — Telephone Encounter (Signed)
Transition Care Management Follow-up Telephone Call   Date discharged? 05/24/15  How have you been since you were released from the hospital? Feeling a little tired but better  Do you understand why you were in the hospital? YES  Do you understand the discharge instructions? YES  Where were you discharged to? HOME  Items Reviewed:  Medications reviewed: YES  Allergies reviewed: YES  Dietary changes reviewed: YES  Referrals reviewed: YES  Functional Questionnaire:   Activities of Daily Living (ADLs):   States they are independent in the following: She is able to perform all ADL's States they require assistance with the following: NONE  Any transportation issues/concerns?: NO  Any patient concerns? NO  Confirmed importance and date/time of follow-up visits scheduled YES  Provider Appointment booked with Terri Piedra, FNP for May 1st, 2017  Confirmed with patient if condition begins to worsen call PCP or go to the ER.  Patient was given the office number and encouraged to call back with question or concerns: YES

## 2015-05-25 NOTE — Discharge Summary (Signed)
Physician Discharge Summary  Katie Higgins ZOX:[id number] DOB: 09/29/1950 DOA: 05/20/2015  PCP: Hoyt Koch, MD  Admit date: 05/20/2015 Discharge date: 05/25/2015  Time spent: 35 minutes  Recommendations for Outpatient Follow-up:  1. Please follow up on blood pressures, her Coreg dose was reduced to 12.5 mg from 25 mg twice a day due to low blood pressures during this hospitalization. 2. Blood cultures were positive for Escherichia coli, she was discharged on Augmentin therapy, plan for 2 week course of affective antimicrobial therapy.   Discharge Diagnoses:  Principal Problem:   Sepsis ([place]) Active Problems:   Diabetes mellitus type 2, controlled, with complications ([place])   HLD (hyperlipidemia)   Essential hypertension   Morbid obesity ([facility])   Arthritis of left knee   Lumbar radiculopathy   Fever   CKD (chronic kidney disease), stage III   Hyperkalemia   Cough   Nausea vomiting and diarrhea   Viral syndrome   Discharge Condition: Stable  Diet recommendation: Heart healthy diet  Filed Weights   05/23/15 0552 05/23/15 1919 05/24/15 0500  Weight: 126.644 kg (279 lb 3.2 oz) 124.921 kg (275 lb 6.4 oz) 124.921 kg (275 lb 6.4 oz)    History of present illness:  Katie Higgins is a 65 y.o. female with PMH of hypertension, hyperlipidemia, morbid obesity, diet controlled diabetes mellitus, gout, obesity, diastolic congestive heart failure, hypothyroidism, chronic kidney disease-stage III, who presents with fever, cough, nausea, vomiting, diarrhea  Patient reports that he started having flulike symptoms since last night, including fever, chills, cough, nausea, vomiting, diarrhea, body aching. He coughs up clear mucus. No chest pain or shortness of breath. She had 3 bowel movements with loose stooling in early morning, but after that she did not have diarrhea. She had mild abdominal discomfort earlier, but no abdominal pain currently. She states that she passes a lot of  gas. Patient denies symptoms of UTI. No unilateral weakness.  In ED, patient was found to have lactic acid 2.97, WBC 7.9, temperature 100.6, tachycardia, tachypnea, negative urinalysis, negative chest x-ray for acute abnormalities, potassium 5.5 without daily with beginning, stable renal function. Patients is admitted to inpatient for further eval and treatment and observation.  Hospital Course:  [name] is a pleasant 65 year old female with a history of morbid obesity, diastolic congestive heart failure, stage III chronic kidney disease, admitted to medicine service on 05/20/2015 when she presented with complaints of fevers, chills, nausea, vomiting, diarrhea. Workup included a chest x-ray that did not reveal acute cardiopulmonary disease. Urinalysis negative for Nitrates and Leukocyte Estrace. She was started on empiric IV antibiotic therapy with vancomycin and Zosyn for possible early sepsis. Blood cultures obtained on 05/20/2015 K back positive for gram-negative rods. She was continued on IV Zosyn. A CT scan abdomen and pelvis ordered on 05/21/2015 revealed changes I could be consistent with early diverticulitis. Bacterial translocation is a possibility. Interestingly she did not have significant pain with palpation on physical exam. Blood cultures were repeated on 05/21/2015.  1. Sepsis -Present on admission, evidenced by positive blood cultures growing gram-negative rods, rate of 116, temperature 101.8, acute renal failure, with possible source of infection from GI tract. She had a CT scan of abdomen and pelvis performed on 05/21/2015 which showed possible early findings of acute diverticulitis. Bacterial translocation is a possibility. -Transthoracic echocardiogram performed 05/20/2068 did not show evidence of endocarditis -Microbiology reporting Escherichia coli growing from blood cultures. Organism is pan susceptible. -Narrowed antimicrobial therapy to ampicillin -She remained afebrile  overnight -Repeat  blood cultures obtained on 05/21/2015 showing no growth after overnight incubation -She was discharged on Augmentin  2. Possible early diverticulitis -As part of workup for sepsis she had a CT scan of abdomen and pelvis performed on 05/21/2015 that revealed findings I could be consistent with early acute diverticulitis. -On physical examination she did not have significant pain with palpation -Change to IV ampicillin on 05/22/2015 -Discharged on Augmentin  3. Acute on chronic renal failure, history of stage III chronic kidney disease -Lab work showing an upward trend in her creatinine to 1.6, appears have baseline creatinine of 1.3-1.4. -On 05/22/2015 lab work showing stable creatinine of 1.49  4. History of diastolic congestive heart failure -Last echo performed on 09/15/2014 which showed preserved ejection fraction with grade 1 diastolic dysfunction. -On exam does not appear volume overloaded.  5. History of hypertension. -Blood pressures in the low normal range for which Coreg was held  6. Cough/shortness of breath -Initial chest x-ray did not reveal acute cardiopulmonary disease. -Flu swab negative   Discharge Exam: Filed Vitals:   05/23/15 2125 05/24/15 0552  BP: 142/69 139/66  Pulse: 73 77  Temp: 98.4 F (36.9 C) 98 F (36.7 C)  Resp: 20 20    General: Nontoxic appearing, awake and alert, anxious to go on today. Cardiovascular: Regular rate and rhythm normal S1-S2 Respiratory: Normal respiratory effort lungs are clear to auscultation bilaterally Abdomen: Soft nontender nondistended Extremities: No edema   Discharge Instructions   Discharge Instructions    Call MD for:  difficulty breathing, headache or visual disturbances    Complete by:  As directed      Call MD for:  extreme fatigue    Complete by:  As directed      Call MD for:  hives    Complete by:  As directed      Call MD for:  persistant dizziness or light-headedness     Complete by:  As directed      Call MD for:  persistant nausea and vomiting    Complete by:  As directed      Call MD for:  redness, tenderness, or signs of infection (pain, swelling, redness, odor or green/yellow discharge around incision site)    Complete by:  As directed      Call MD for:  severe uncontrolled pain    Complete by:  As directed      Call MD for:  temperature >100.4    Complete by:  As directed      Call MD for:    Complete by:  As directed      Diet - low sodium heart healthy    Complete by:  As directed      Increase activity slowly    Complete by:  As directed           Discharge Medication List as of 05/24/2015 11:01 AM    START taking these medications   Details  amoxicillin-clavulanate (AUGMENTIN) 875-125 MG tablet Take 1 tablet by mouth 2 (two) times daily., Starting 05/24/2015, Until Discontinued, Print      CONTINUE these medications which have CHANGED   Details  carvedilol (COREG) 12.5 MG tablet Take 1 tablet (12.5 mg total) by mouth 2 (two) times daily with a meal., Starting 05/24/2015, Until Discontinued, Print      CONTINUE these medications which have NOT CHANGED   Details  aspirin 81 MG tablet Take 81 mg by mouth daily.  , Until Discontinued, Historical Med  benzonatate (TESSALON) 100 MG capsule Take 1 capsule (100 mg total) by mouth 2 (two) times daily as needed for cough., Starting 01/15/2015, Until Discontinued, Normal    colchicine 0.6 MG tablet Take 1 tablet (0.6 mg total) by mouth daily., Starting 02/05/2014, Until Discontinued, Fax    cyclobenzaprine (FLEXERIL) 10 MG tablet Take 1 tablet (10 mg total) by mouth every 8 (eight) hours as needed for muscle spasms., Starting 04/13/2015, Until Wed 04/12/16, Normal    dextromethorphan-guaiFENesin (MUCINEX DM) 30-600 MG 12hr tablet Take 1 tablet by mouth 2 (two) times daily as needed for cough., Until Discontinued, Historical Med    furosemide (LASIX) 20 MG tablet Take 1 tablet (20 mg total) by  mouth daily., Starting 09/09/2014, Until Discontinued, Normal    lovastatin (MEVACOR) 10 MG tablet Take 1 tablet (10 mg total) by mouth at bedtime., Starting 09/09/2014, Until Discontinued, Normal    methimazole (TAPAZOLE) 5 MG tablet Take 1 tablet (5 mg total) by mouth daily., Starting 09/14/2014, Until Discontinued, Normal    Multiple Vitamin (MULTIVITAMIN) tablet Take 1 tablet by mouth daily.  , Until Discontinued, Historical Med    ondansetron (ZOFRAN) 8 MG tablet Take 1 tablet (8 mg total) by mouth every 8 (eight) hours as needed for nausea or vomiting., Starting 04/28/2015, Until Discontinued, Normal    traMADol (ULTRAM) 50 MG tablet Take 1 tablet (50 mg total) by mouth every 6 (six) hours as needed., Starting 03/01/2015, Until Discontinued, Print      STOP taking these medications     diphenhydrAMINE (BENADRYL) 25 mg capsule      enalapril (VASOTEC) 10 MG tablet      glucose blood test strip      glucose monitoring kit (FREESTYLE) monitoring kit      meloxicam (MOBIC) 15 MG tablet        Allergies  Allergen Reactions  . Amitriptyline Other (See Comments)    Leg cramps    Follow-up Information    Follow up with Hoyt Koch, MD In 1 week.   Specialty:  Internal Medicine   Contact information:   [place] [id number] [phone]       Follow up with Loralie Champagne, MD In 2 weeks.   Specialty:  Cardiology   Contact information:   [address] [place] [address] [phone]        The results of significant diagnostics from this hospitalization (including imaging, microbiology, ancillary and laboratory) are listed below for reference.    Significant Diagnostic Studies: Ct Abdomen Pelvis Wo Contrast  05/21/2015  CLINICAL DATA:  Fever, chills, cough. EXAM: CT ABDOMEN AND PELVIS WITHOUT CONTRAST TECHNIQUE: Multidetector CT imaging of the abdomen and pelvis was performed following the standard protocol without IV contrast.  COMPARISON:  01/21/2013 FINDINGS: Lower chest: Lung bases are clear. No effusions. Heart is normal size. Scattered coronary artery calcifications in the right coronary artery. Hepatobiliary: Prior cholecystectomy. Unremarkable unenhanced appearance Pancreas: Unremarkable unenhanced appearance Spleen: Unremarkable unenhanced appearance Adrenals/Urinary Tract: No adrenal abnormality. No focal renal abnormality. No stones or hydronephrosis. Urinary bladder is unremarkable. Stomach/Bowel: Sigmoid diverticulosis. Subtle haziness around areas of the sigmoid colon are noted, best seen on the coronal reconstructed images. This could reflect early changes of active diverticulitis. Appendix is normal. Stomach and small bowel decompressed. Vascular/Lymphatic: No evidence of aneurysm or adenopathy. Reproductive: Uterus and adnexa unremarkable. No mass. Bilateral tubal ligation clips noted. Other: No free fluid or free air. Musculoskeletal: No acute bony abnormality or  focal bone lesion. IMPRESSION: Sigmoid diverticulosis. Subtle haziness noted around areas of the sigmoid colon could reflect early active diverticulitis. Recommend clinical correlation. Normal appendix. Prior cholecystectomy. Coronary artery calcifications. Electronically Signed   By: Rolm Baptise M.D.   On: 05/21/2015 10:47   Dg Chest 2 View  05/20/2015  CLINICAL DATA:  Fever and cough for 48 hours EXAM: CHEST  2 VIEW COMPARISON:  01/14/2014 FINDINGS: The heart size and mediastinal contours are within normal limits. Both lungs are clear. The visualized skeletal structures are unremarkable. IMPRESSION: No active cardiopulmonary disease. Electronically Signed   By: Andreas Newport M.D.   On: 05/20/2015 03:03   Dg Chest Port 1 View  05/21/2015  CLINICAL DATA:  Sepsis with fever and chills EXAM: PORTABLE CHEST 1 VIEW COMPARISON:  05/20/2015 FINDINGS: Normal heart size and mediastinal contours. Stable prominence of basilar lung markings. No acute infiltrate or  edema. No effusion or pneumothorax. No acute osseous findings. IMPRESSION: No change or convincing pneumonia. Electronically Signed   By: Monte Fantasia M.D.   On: 05/21/2015 07:54    Microbiology: Recent Results (from the past 240 hour(s))  Culture, blood (Routine X 2) w Reflex to ID Panel     Status: Abnormal   Collection Time: 05/20/15  1:24 AM  Result Value Ref Range Status   Specimen Description BLOOD RIGHT ANTECUBITAL  Final   Special Requests BOTTLES DRAWN AEROBIC AND ANAEROBIC 5CC  Final   Culture  Setup Time   Final    GRAM NEGATIVE RODS IN BOTH AEROBIC AND ANAEROBIC BOTTLES CRITICAL RESULT CALLED TO, READ BACK BY AND VERIFIED WITH: Erline Hau RN 17:50 05/20/15 ([email or web address]) Performed at [facility] (A)  Final   Report Status 05/22/2015 FINAL  Final   Organism ID, Bacteria ESCHERICHIA COLI  Final      Susceptibility   Escherichia coli - MIC*    AMPICILLIN <=2 SENSITIVE Sensitive     CEFAZOLIN <=4 SENSITIVE Sensitive     CEFEPIME <=1 SENSITIVE Sensitive     CEFTAZIDIME <=1 SENSITIVE Sensitive     CEFTRIAXONE <=1 SENSITIVE Sensitive     CIPROFLOXACIN <=0.25 SENSITIVE Sensitive     GENTAMICIN <=1 SENSITIVE Sensitive     IMIPENEM <=0.25 SENSITIVE Sensitive     TRIMETH/SULFA <=20 SENSITIVE Sensitive     AMPICILLIN/SULBACTAM <=2 SENSITIVE Sensitive     PIP/TAZO <=4 SENSITIVE Sensitive     * ESCHERICHIA COLI  Culture, blood (Routine X 2) w Reflex to ID Panel     Status: Abnormal   Collection Time: 05/20/15  1:32 AM  Result Value Ref Range Status   Specimen Description BLOOD LEFT ANTECUBITAL  Final   Special Requests BOTTLES DRAWN AEROBIC AND ANAEROBIC 5CC  Final   Culture  Setup Time   Final    GRAM NEGATIVE RODS ANAEROBIC BOTTLE ONLY CRITICAL RESULT CALLED TO, READ BACK BY AND VERIFIED WITH: Erline Hau RN 17:50 05/19/12 ([email or web address])    Culture (A)  Final    ESCHERICHIA COLI SUSCEPTIBILITIES PERFORMED ON PREVIOUS CULTURE WITHIN THE LAST 5  DAYS. Performed at [facility]    Report Status 05/23/2015 FINAL  Final  Urine culture     Status: None   Collection Time: 05/20/15  3:08 AM  Result Value Ref Range Status   Specimen Description URINE, CLEAN CATCH  Final   Special Requests NONE  Final   Culture MULTIPLE SPECIES PRESENT, SUGGEST RECOLLECTION  Final   Report Status 05/21/2015 FINAL  Final  Culture, blood (Routine X 2) w Reflex to ID Panel     Status: None (Preliminary result)   Collection Time: 05/21/15  4:31 PM  Result Value Ref Range Status   Specimen Description BLOOD RIGHT ANTECUBITAL  Final   Special Requests BOTTLES DRAWN AEROBIC AND ANAEROBIC 5 CC EA  Final   Culture   Final    NO GROWTH 4 DAYS Performed at [facility]    Report Status PENDING  Incomplete  Culture, blood (Routine X 2) w Reflex to ID Panel     Status: None (Preliminary result)   Collection Time: 05/21/15  4:31 PM  Result Value Ref Range Status   Specimen Description BLOOD RIGHT HAND  Final   Special Requests IN PEDIATRIC BOTTLE 1.5 CC  Final   Culture   Final    NO GROWTH 4 DAYS Performed at [facility]    Report Status PENDING  Incomplete  C difficile quick scan w PCR reflex     Status: None   Collection Time: 05/21/15  5:00 PM  Result Value Ref Range Status   C Diff antigen NEGATIVE NEGATIVE Final   C Diff toxin NEGATIVE NEGATIVE Final   C Diff interpretation Negative for toxigenic C. difficile  Final     Labs: Basic Metabolic Panel:  Recent Labs Lab 05/20/15 0125 05/20/15 0805 05/21/15 0441 05/22/15 0432  NA 141 144 136 141  K 5.5* 4.7 4.3 4.5  CL 107 112* 109 111  CO2 22 20* 19* 22  GLUCOSE 115* 143* 115* 92  BUN 29* 25* 23* 20  CREATININE 1.55* 1.48* 1.61* 1.49*  CALCIUM 8.7* 7.8* 7.6* 8.1*   Liver Function Tests:  Recent Labs Lab 05/20/15 0125 05/21/15 0441  AST 20 35  ALT 13* 36  ALKPHOS 70 57  BILITOT 0.4 1.2  PROT 7.2 5.8*  ALBUMIN 3.8 2.9*   No results for input(s): LIPASE,  AMYLASE in the last 168 hours. No results for input(s): AMMONIA in the last 168 hours. CBC:  Recent Labs Lab 05/20/15 0125 05/21/15 0441 05/22/15 0432  WBC 7.9 8.0 5.6  NEUTROABS 7.0  --   --   HGB 12.0 10.2* 9.8*  HCT 35.5* 30.9* 29.9*  MCV 86.0 86.6 86.2  PLT 214 169 150   Cardiac Enzymes: No results for input(s): CKTOTAL, CKMB, CKMBINDEX, TROPONINI in the last 168 hours. BNP: BNP (last 3 results)  Recent Labs  05/20/15 0554  BNP 93.1    ProBNP (last 3 results) No results for input(s): PROBNP in the last 8760 hours.  CBG: No results for input(s): GLUCAP in the last 168 hours.     Signed:  Kelvin Cellar MD.  [facility] 05/25/2015, 5:54 PM

## 2015-05-26 LAB — CULTURE, BLOOD (ROUTINE X 2)
CULTURE: NO GROWTH
Culture: NO GROWTH

## 2015-06-07 ENCOUNTER — Ambulatory Visit (INDEPENDENT_AMBULATORY_CARE_PROVIDER_SITE_OTHER): Payer: Medicare Other | Admitting: Family

## 2015-06-07 ENCOUNTER — Encounter: Payer: Self-pay | Admitting: Family

## 2015-06-07 VITALS — BP 110/82 | HR 88 | Temp 98.0°F | Resp 16 | Ht 62.0 in

## 2015-06-07 DIAGNOSIS — A4151 Sepsis due to Escherichia coli [E. coli]: Secondary | ICD-10-CM

## 2015-06-07 DIAGNOSIS — N183 Chronic kidney disease, stage 3 unspecified: Secondary | ICD-10-CM

## 2015-06-07 DIAGNOSIS — I10 Essential (primary) hypertension: Secondary | ICD-10-CM | POA: Diagnosis not present

## 2015-06-07 DIAGNOSIS — M2550 Pain in unspecified joint: Secondary | ICD-10-CM | POA: Diagnosis not present

## 2015-06-07 MED ORDER — TRAMADOL HCL 50 MG PO TABS: 50.0000 mg | ORAL_TABLET | Freq: Four times a day (QID) | ORAL | Status: DC | PRN

## 2015-06-07 NOTE — Assessment & Plan Note (Signed)
Previous blood work with chronic kidney disease stage III. Continue to monitor at this time with goal of ensuring blood pressure control and good blood sugar control. Follow-up with PCP as scheduled.

## 2015-06-07 NOTE — Progress Notes (Signed)
Subjective:    Patient ID: Katie Higgins, female    DOB: December 10, 1950, 65 y.o.   MRN: [id number]  Chief Complaint  Patient presents with  . Hospitalization Follow-up    no concerns, she feels better since being out of the hospital    HPI:  Katie Higgins is a 65 y.o. female who  has a past medical history of Palpitations; Obesity; CARDIOMYOPATHY; Congestive heart failure, unspecified; Osteoarthritis; ALLERGIC RHINITIS; DIABETES MELLITUS, CONTROLLED; DYSLIPIDEMIA; HYPERTENSION; HYPERTHYROIDISM; CARPAL TUNNEL SYNDROME, LEFT, MILD; Graves disease; and CKD (chronic kidney disease), stage III. and presents today for a hospitalization follow up.  Recently evaluated in the emergency department and admitted to the hospital for flulike symptoms including constant and ongoing chills, myalgias, nausea, vomiting, diarrhea, abdominal pain, cough, and fever with a temperature maximum of 102. Physical exam with tachycardia and tenderness of the abdomen with no rebound, guarding, McBurney's point, or Murphy sign. No source was identified for potential sepsis but she was treated for sepsis. She was found to have a lactic acid of 2.97, white blood cell count of 7.9, and temperature 100.6. Urinalysis was negative; chest x-ray was negative for acute abnormalities; and potassium was 5.5. She was started on her IV antibiotic therapy with vancomycin and Zosyn for possible early sepsis. CT scan of the abdomen and pelvis revealed changes that could be consistent with early diverticulitis but no particular infection. She did not have any significant pain with palpation of physical exam. Transthoracic echocardiogram showed no evidence of endocarditis. Blood culture was positive for Escherichia coli with pan susceptibility. She was discharged on Augmentin. She was also noted to have elevated creatinine which appears baseline around 1.3-1.4. Blood pressures were low in the hospital and her Coreg was adjusted to 12.5 from 25.  Hospitalist recommends follow-up with blood pressure and completion of antibiotic therapy. All hospital records, labs, and imaging reviewed in detail.  Since leaving the hospital she reports that she is feeling better. She has completed the dose of Augmentin without any difficulties or side effects. She has returned to her normal activities of daily living. Endorses since stopping the Coreg her hands and feet do not feel as cold as they did prior. Home blood pressures have been stable and below goal. No adverse side effects. No nausea, vomiting, diarrhea, or constipation.    Allergies  Allergen Reactions  . Amitriptyline Other (See Comments)    Leg cramps      Current Outpatient Prescriptions on File Prior to Visit  Medication Sig Dispense Refill  . aspirin 81 MG tablet Take 81 mg by mouth daily.      . carvedilol (COREG) 12.5 MG tablet Take 1 tablet (12.5 mg total) by mouth 2 (two) times daily with a meal. 30 tablet 2  . colchicine 0.6 MG tablet Take 1 tablet (0.6 mg total) by mouth daily. (Patient taking differently: Take 0.6 mg by mouth daily as needed (gout). ) 90 tablet 0  . cyclobenzaprine (FLEXERIL) 10 MG tablet Take 1 tablet (10 mg total) by mouth every 8 (eight) hours as needed for muscle spasms. 90 tablet 0  . dextromethorphan-guaiFENesin (MUCINEX DM) 30-600 MG 12hr tablet Take 1 tablet by mouth 2 (two) times daily as needed for cough.    . furosemide (LASIX) 20 MG tablet Take 1 tablet (20 mg total) by mouth daily. (Patient taking differently: Take 20 mg by mouth daily as needed for fluid or edema. ) 90 tablet 3  . lovastatin (MEVACOR) 10 MG tablet Take 1  tablet (10 mg total) by mouth at bedtime. 90 tablet 2  . methimazole (TAPAZOLE) 5 MG tablet Take 1 tablet (5 mg total) by mouth daily. 90 tablet 2  . Multiple Vitamin (MULTIVITAMIN) tablet Take 1 tablet by mouth daily.      . ondansetron (ZOFRAN) 8 MG tablet Take 1 tablet (8 mg total) by mouth every 8 (eight) hours as needed for  nausea or vomiting. 90 tablet 3   No current facility-administered medications on file prior to visit.     Past Surgical History  Procedure Laterality Date  . Cholecystectomy    . Remote hernia repair    . Cardiac catheterization  04/19/09    Past Medical History  Diagnosis Date  . Palpitations   . Obesity   . CARDIOMYOPATHY     Nonischemic. Admitted in 3/11 with CHF exac, due to hyperthyroidism. LHC (03/11) showed clean coronaries with EF 40%. Myoview showed EF 38%. Echo was a technically difficult study with mild to moderately decreased EF, mild LVH, mild MR. Repeat echo (9/11) with EF 0000000, mild diastolic dysfunction (grade I).  . Congestive heart failure, unspecified   . Osteoarthritis     esp L hip, low back  . ALLERGIC RHINITIS   . DIABETES MELLITUS, CONTROLLED   . DYSLIPIDEMIA   . HYPERTENSION   . HYPERTHYROIDISM   . CARPAL TUNNEL SYNDROME, LEFT, MILD   . Graves disease   . CKD (chronic kidney disease), stage III      Review of Systems  Constitutional: Negative for fever and chills.  Respiratory: Negative for chest tightness and shortness of breath.   Cardiovascular: Negative for chest pain, palpitations and leg swelling.  Gastrointestinal: Negative for nausea, vomiting, abdominal pain, diarrhea, constipation, blood in stool and abdominal distention.  Neurological: Negative for headaches.      Objective:    BP 110/82 mmHg  Pulse 88  Temp(Src) 98 F (36.7 C) (Oral)  Resp 16  Ht 5\' 2"  (1.575 m)  Wt   SpO2 96% Nursing note and vital signs reviewed.  Physical Exam  Constitutional: She is oriented to person, place, and time. She appears well-developed and well-nourished. No distress.  Cardiovascular: Normal rate, regular rhythm, normal heart sounds and intact distal pulses.  Exam reveals no gallop and no friction rub.   No murmur heard. Pulmonary/Chest: Effort normal and breath sounds normal. No respiratory distress. She has no wheezes. She has no rales.  She exhibits no tenderness.  Abdominal: Soft. Bowel sounds are normal. She exhibits no distension and no mass. There is no tenderness. There is no rebound and no guarding.  Neurological: She is alert and oriented to person, place, and time.  Skin: Skin is warm and dry.  Psychiatric: She has a normal mood and affect. Her behavior is normal. Judgment and thought content normal.       Assessment & Plan:   Problem List Items Addressed This Visit      Cardiovascular and Mediastinum   Essential hypertension     Genitourinary   CKD (chronic kidney disease), stage III    Previous blood work with chronic kidney disease stage III. Continue to monitor at this time with goal of ensuring blood pressure control and good blood sugar control. Follow-up with PCP as scheduled.        Other   Sepsis ([place])    Symptoms of sepsis appear resolved with no further symptoms of chills, fever, myalgias, or abdominal pain. Patient is return to baseline with no adverse effects.  No further treatment is necessary at this time. Follow-up if symptoms return.       Other Visit Diagnoses    Arthralgia of multiple joints    -  Primary    Relevant Medications    traMADol (ULTRAM) 50 MG tablet        I have discontinued Ms. Holbein's benzonatate and amoxicillin-clavulanate. I am also having her maintain her aspirin, multivitamin, colchicine, lovastatin, furosemide, methimazole, cyclobenzaprine, ondansetron, dextromethorphan-guaiFENesin, carvedilol, and traMADol.   Meds ordered this encounter  Medications  . traMADol (ULTRAM) 50 MG tablet    Sig: Take 1 tablet (50 mg total) by mouth every 6 (six) hours as needed.    Dispense:  180 tablet    Refill:  0    Order Specific Question:  Supervising Provider    Answer:  Pricilla Holm A [id number]     Follow-up: Return if symptoms worsen or fail to improve.  Mauricio Po, FNP

## 2015-06-07 NOTE — Progress Notes (Signed)
Pre visit review using our clinic review tool, if applicable. No additional management support is needed unless otherwise documented below in the visit note. 

## 2015-06-07 NOTE — Assessment & Plan Note (Signed)
Symptoms of sepsis appear resolved with no further symptoms of chills, fever, myalgias, or abdominal pain. Patient is return to baseline with no adverse effects. No further treatment is necessary at this time. Follow-up if symptoms return.

## 2015-06-07 NOTE — Patient Instructions (Addendum)
Thank you for choosing [facility].  Summary/Instructions:  Continue to take your medications as prescribed.   If your symptoms worsen or fail to improve, please contact our office for further instruction, or in case of emergency go directly to the emergency room at the closest medical facility.     

## 2015-06-14 ENCOUNTER — Ambulatory Visit: Payer: Medicare Other | Admitting: Cardiology

## 2015-06-14 ENCOUNTER — Encounter: Payer: Self-pay | Admitting: Cardiology

## 2015-06-15 ENCOUNTER — Encounter: Payer: Self-pay | Admitting: Cardiology

## 2015-06-15 ENCOUNTER — Encounter: Payer: Medicare Other | Admitting: Endocrinology

## 2015-06-17 ENCOUNTER — Other Ambulatory Visit: Payer: Self-pay | Admitting: *Deleted

## 2015-06-17 ENCOUNTER — Encounter: Payer: Self-pay | Admitting: Family

## 2015-06-17 DIAGNOSIS — M2550 Pain in unspecified joint: Secondary | ICD-10-CM

## 2015-06-17 DIAGNOSIS — I5032 Chronic diastolic (congestive) heart failure: Secondary | ICD-10-CM

## 2015-06-17 DIAGNOSIS — R0602 Shortness of breath: Secondary | ICD-10-CM

## 2015-06-17 MED ORDER — LOVASTATIN 10 MG PO TABS: 10.0000 mg | ORAL_TABLET | Freq: Every day | ORAL | Status: DC

## 2015-06-18 MED ORDER — TRAMADOL HCL 50 MG PO TABS: 50.0000 mg | ORAL_TABLET | Freq: Four times a day (QID) | ORAL | Status: DC | PRN

## 2015-07-14 ENCOUNTER — Encounter: Payer: Self-pay | Admitting: Endocrinology

## 2015-07-14 ENCOUNTER — Ambulatory Visit (INDEPENDENT_AMBULATORY_CARE_PROVIDER_SITE_OTHER): Payer: Medicare Other | Admitting: Endocrinology

## 2015-07-14 VITALS — BP 134/62 | HR 65 | Temp 98.1°F | Ht 62.0 in | Wt 270.0 lb

## 2015-07-14 DIAGNOSIS — E0580 Other thyrotoxicosis without thyrotoxic crisis or storm: Secondary | ICD-10-CM | POA: Diagnosis not present

## 2015-07-14 LAB — T4, FREE: Free T4: 0.86 ng/dL (ref 0.60–1.60)

## 2015-07-14 LAB — TSH: TSH: 2.52 u[IU]/mL (ref 0.35–4.50)

## 2015-07-14 NOTE — Progress Notes (Signed)
Subjective:    Patient ID: Katie Higgins, female    DOB: 09-18-1950, 65 y.o.   MRN: [id number]  HPI Pt returns for f/u of hyperthyroidism (due to [place] dz; dx'ed 2010; US showed heterogeneity of texture and one small nodule; she has been unable to d/c tapazole for RAI rx, due to CHF).  pt states she feels well in general.  She takes tapazole as rx'ed.   Past Medical History  Diagnosis Date  . Palpitations   . Obesity   . CARDIOMYOPATHY     Nonischemic. Admitted in 3/11 with CHF exac, due to hyperthyroidism. LHC (03/11) showed clean coronaries with EF 40%. Myoview showed EF 38%. Echo was a technically difficult study with mild to moderately decreased EF, mild LVH, mild MR. Repeat echo (9/11) with EF 0000000, mild diastolic dysfunction (grade I).  . Congestive heart failure, unspecified   . Osteoarthritis     esp L hip, low back  . ALLERGIC RHINITIS   . DIABETES MELLITUS, CONTROLLED   . DYSLIPIDEMIA   . HYPERTENSION   . HYPERTHYROIDISM   . CARPAL TUNNEL SYNDROME, LEFT, MILD   . Graves disease   . CKD (chronic kidney disease), stage III     Past Surgical History  Procedure Laterality Date  . Cholecystectomy    . Remote hernia repair    . Cardiac catheterization  04/19/09    Social History   Social History  . Marital Status: Widowed    Spouse Name: N/A  . Number of Children: N/A  . Years of Education: N/A   Occupational History  . Not on file.   Social History Main Topics  . Smoking status: Never Smoker   . Smokeless tobacco: Not on file  . Alcohol Use: No  . Drug Use: No  . Sexual Activity: Not on file   Other Topics Concern  . Not on file   Social History Narrative   Lives in [place] with her son.   Widowed since 1992.   She is very sedentary, uses a wheelchair when out of the house.   Ambulatory in home with cane.    Current Outpatient Prescriptions on File Prior to Visit  Medication Sig Dispense Refill  . aspirin 81 MG tablet Take 81 mg by mouth  daily.      . carvedilol (COREG) 12.5 MG tablet Take 1 tablet (12.5 mg total) by mouth 2 (two) times daily with a meal. 30 tablet 2  . colchicine 0.6 MG tablet Take 1 tablet (0.6 mg total) by mouth daily. (Patient taking differently: Take 0.6 mg by mouth daily as needed (gout). ) 90 tablet 0  . cyclobenzaprine (FLEXERIL) 10 MG tablet Take 1 tablet (10 mg total) by mouth every 8 (eight) hours as needed for muscle spasms. 90 tablet 0  . dextromethorphan-guaiFENesin (MUCINEX DM) 30-600 MG 12hr tablet Take 1 tablet by mouth 2 (two) times daily as needed for cough.    . furosemide (LASIX) 20 MG tablet Take 1 tablet (20 mg total) by mouth daily. (Patient taking differently: Take 20 mg by mouth daily as needed for fluid or edema. ) 90 tablet 3  . lovastatin (MEVACOR) 10 MG tablet Take 1 tablet (10 mg total) by mouth at bedtime. 90 tablet 0  . methimazole (TAPAZOLE) 5 MG tablet Take 1 tablet (5 mg total) by mouth daily. 90 tablet 2  . Multiple Vitamin (MULTIVITAMIN) tablet Take 1 tablet by mouth daily.      . ondansetron (ZOFRAN) 8 MG tablet  Take 1 tablet (8 mg total) by mouth every 8 (eight) hours as needed for nausea or vomiting. 90 tablet 3  . traMADol (ULTRAM) 50 MG tablet Take 1 tablet (50 mg total) by mouth every 6 (six) hours as needed. 180 tablet 0   No current facility-administered medications on file prior to visit.    Allergies  Allergen Reactions  . Amitriptyline Other (See Comments)    Leg cramps     Family History  Problem Relation Age of Onset  . Diabetes Mother   . Breast cancer Mother   . Kidney cancer Father     Kidney Cancer  . Stroke Father     CVA  . Cervical cancer Mother   . Supraventricular tachycardia Sister   . Asthma      family history    BP 134/62 mmHg  Pulse 65  Temp(Src) 98.1 F (36.7 C) (Oral)  Ht 5\' 2"  (1.575 m)  Wt 270 lb (122.471 kg)  BMI 49.37 kg/m2  SpO2 96%  Review of Systems Denies fever    Objective:   Physical Exam VITAL SIGNS:  See vs  page GENERAL: no distress.  In wheelchair.   NECK: There is no palpable thyroid enlargement.  No thyroid nodule is palpable.  No palpable lymphadenopathy at the anterior neck.  Lab Results  Component Value Date   TSH 3.13 07/01/2014      Assessment & Plan:  Hyperthyroidism: well-controlled.  Please continue the same medication.   Patient is advised the following: Patient Instructions  blood tests are requested for you today.  We'll let you know about the results.  Please come back for a follow-up appointment in 6 months.  if ever you have fever while taking methimazole, stop it and call us, because of the risk of a rare side-effect.     Renato Shin, MD

## 2015-07-14 NOTE — Patient Instructions (Signed)
blood tests are requested for you today.  We'll let you know about the results.  Please come back for a follow-up appointment in 6 months.  if ever you have fever while taking methimazole, stop it and call us, because of the risk of a rare side-effect.    

## 2015-08-17 ENCOUNTER — Ambulatory Visit (INDEPENDENT_AMBULATORY_CARE_PROVIDER_SITE_OTHER): Payer: Medicare Other | Admitting: Family Medicine

## 2015-08-17 ENCOUNTER — Encounter: Payer: Self-pay | Admitting: Cardiology

## 2015-08-17 ENCOUNTER — Telehealth: Payer: Self-pay | Admitting: Cardiology

## 2015-08-17 ENCOUNTER — Encounter: Payer: Self-pay | Admitting: Family Medicine

## 2015-08-17 VITALS — BP 130/70 | HR 73 | Temp 97.8°F | Resp 12 | Ht 62.0 in

## 2015-08-17 DIAGNOSIS — R001 Bradycardia, unspecified: Secondary | ICD-10-CM

## 2015-08-17 DIAGNOSIS — I499 Cardiac arrhythmia, unspecified: Secondary | ICD-10-CM

## 2015-08-17 DIAGNOSIS — J069 Acute upper respiratory infection, unspecified: Secondary | ICD-10-CM | POA: Diagnosis not present

## 2015-08-17 LAB — BASIC METABOLIC PANEL
BUN: 25 mg/dL — AB (ref 6–23)
CHLORIDE: 106 meq/L (ref 96–112)
CO2: 26 meq/L (ref 19–32)
Calcium: 9.3 mg/dL (ref 8.4–10.5)
Creatinine, Ser: 1.44 mg/dL — ABNORMAL HIGH (ref 0.40–1.20)
GFR: 38.72 mL/min — AB (ref >60.00)
GLUCOSE: 114 mg/dL — AB (ref 70–99)
POTASSIUM: 3.9 meq/L (ref 3.5–5.1)
SODIUM: 140 meq/L (ref 135–145)

## 2015-08-17 NOTE — Telephone Encounter (Signed)
Pt states she took her coreg and lovastatin at 10 PM last night.  Pt states she woke up with tremor/shaking all over about 5:30 AM. Pt states she was restless during the night and was up and down all night. Pt states heart rate was 43 and BP was 132/83. Pt states at 8:15AM HR and BP 133/96 75.  Pt states her coreg 25mg  was decreased in March to 1/2 bid when she was hospitalized with sepsis.   Pt states about 2 weeks after hospitalization in March she increased coreg back to 1 tablet two times a day on her own.  Pt states she has not checked HR and BP before this morning. Pt states she is asymptomatic at this time except for congested nose. Pt states it is time for her to take coreg and she is asking if she should take it.

## 2015-08-17 NOTE — Telephone Encounter (Signed)
Reviewed with Richardson Dopp, PA-  Per Nicki Reaper-  See Primary Care today Hold coreg today Decrease coreg to 12.5mg  bid tomorrow 48 hour Holter later this week Appt here next week

## 2015-08-17 NOTE — Progress Notes (Signed)
Pre visit review using our clinic review tool, if applicable. No additional management support is needed unless otherwise documented below in the visit note. 

## 2015-08-17 NOTE — Telephone Encounter (Signed)
New message   Pt verbalized that she is following up on her email   Pt c/o BP issue: STAT if pt c/o blurred vision, one-sided weakness or slurred speech  1. What are your last 5 BP readings? 132/83 Hr 43  2. Are you having any other symptoms (ex. Dizziness, headache, blurred vision, passed out)? no  3. What is your BP issue? high

## 2015-08-17 NOTE — Patient Instructions (Addendum)
A few things to remember from today's visit:   [name] I have seen you today for an acute visit because your primary care provider was not available. Monitor for signs of worsening symptoms and seek immediate medical attention if any concerning/warning symptom as we discussed.   Irregular heart rate - Plan: Basic metabolic panel  Bradycardia - Plan: EKG 12-Lead  EKG done today I don't see atrial fibrillation, a few extra heartbeats. As instructed resume carvedilol tomorrow but a lower dose. Avoid cold medications. Keep appointment with cardiologist 08/25/15 and pick up  Holter monitor on 08/19/2015 as instructed.  If the chest pain, clammy sensation, difficulty breathing, or other worrisome symptoms to seek immediate medical attention.  For running nose you can use nasal saline and over-the-counter Rhinocort or Flonase.

## 2015-08-17 NOTE — Progress Notes (Addendum)
HPI:  ACUTE VISIT:  Chief Complaint  Patient presents with  . Fatigue    Pt states that she hasn't slept since yesterday. She takes her coreg & lovastatin at night like usual. Daughter in law checked her bp last night, it was 132/93 and her pulse was 43 in one arm & 45 in the other. She does have an irregular heartbeat and she was hospitalized over easter.     Katie Higgins is a 65 y.o. female, who is here today with her daughter complaining of feeling "weak" and "shaking on the inside". She could not sleep last night because these symptoms and around 5 AM this morning she got up.  According to her daughter, this morning around 5 AM, she checked her pulse and he was in the low 40's, skipping beats. She denies any prior history of bradycardia, she denies any palpitations or chest pain. Hx of CHF, HLD, and DM II.   She already contacted her cardiologist, Dr. Benjamine Mola, she was instructed to hold on her Carvedilol today and resume it tomorrow but 1/2 of the dose.  She states that for the past 2 days she has been "feeling off", fatigue, like she is going to get a cold.  She has had nasal congestion, rhinorrhea, and mild dysphagia.  She has had nonproductive cough but according to her daughter she has had cough for "long time", she coughs "continuesly."  "Slight headache". No sick contact or recent travel.  She has a Holter monitor already ordered, she is supposed to pick it up on 08/19/2015, she has an appointment with cardiology's PA on 08/25/2015, and she has an appointment with Dr. Benjamine Mola on 09/10/2015.   She has history of generalized osteoarthritis, so she stays in a wheelchair most of the day. She denies any chest pain, dyspnea, wheezing, diaphoresis, orthopnea,PND, or LE edema.  CXR 05/21/15: No change or convincing pneumonia.  Chest angio CT 04/2009: 1.  Negative for pulmonary embolism.  Image quality slightly degraded by respiratory motion/body habitus. 2.  Cardiomegaly and pulmonary vascular congestion.   Echo: 05/2015  mild LVH. Systolic function was normal. The estimated ejection fraction was in the range of 60% to 65% and grade 2 diastolic dysfunction.   Lab Results  Component Value Date   TSH 2.52 07/14/2015   Lab Results  Component Value Date   CREATININE 1.49* 05/22/2015   BUN 20 05/22/2015   NA 141 05/22/2015   K 4.5 05/22/2015   CL 111 05/22/2015   CO2 22 05/22/2015    Review of Systems  Constitutional: Positive for fatigue. Negative for fever, activity change and appetite change.  HENT: Positive for congestion, postnasal drip and rhinorrhea. Negative for ear pain, mouth sores, sinus pressure, sneezing, sore throat, trouble swallowing and voice change.   Eyes: Negative for discharge, redness and itching.  Respiratory: Positive for cough. Negative for shortness of breath and wheezing.   Cardiovascular: Negative.   Gastrointestinal: Negative for nausea, vomiting, abdominal pain and diarrhea.  Musculoskeletal: Positive for arthralgias. Negative for back pain and neck pain.  Skin: Negative for rash and wound.  Allergic/Immunologic: Negative for environmental allergies.  Neurological: Negative for syncope, weakness, numbness and headaches.  Psychiatric/Behavioral: Positive for sleep disturbance. Negative for hallucinations and confusion.      Current Outpatient Prescriptions on File Prior to Visit  Medication Sig Dispense Refill  . aspirin 81 MG tablet Take 81 mg by mouth daily.      . carvedilol (COREG) 12.5 MG tablet  Take 1 tablet (12.5 mg total) by mouth 2 (two) times daily with a meal. 30 tablet 2  . colchicine 0.6 MG tablet Take 1 tablet (0.6 mg total) by mouth daily. (Patient taking differently: Take 0.6 mg by mouth daily as needed (gout). ) 90 tablet 0  . cyclobenzaprine (FLEXERIL) 10 MG tablet Take 1 tablet (10 mg total) by mouth every 8 (eight) hours as needed for muscle spasms. 90 tablet 0  .  dextromethorphan-guaiFENesin (MUCINEX DM) 30-600 MG 12hr tablet Take 1 tablet by mouth 2 (two) times daily as needed for cough.    . furosemide (LASIX) 20 MG tablet Take 1 tablet (20 mg total) by mouth daily. (Patient taking differently: Take 20 mg by mouth daily as needed for fluid or edema. ) 90 tablet 3  . lovastatin (MEVACOR) 10 MG tablet Take 1 tablet (10 mg total) by mouth at bedtime. 90 tablet 0  . methimazole (TAPAZOLE) 5 MG tablet Take 1 tablet (5 mg total) by mouth daily. 90 tablet 2  . Multiple Vitamin (MULTIVITAMIN) tablet Take 1 tablet by mouth daily.      . ondansetron (ZOFRAN) 8 MG tablet Take 1 tablet (8 mg total) by mouth every 8 (eight) hours as needed for nausea or vomiting. 90 tablet 3  . traMADol (ULTRAM) 50 MG tablet Take 1 tablet (50 mg total) by mouth every 6 (six) hours as needed. 180 tablet 0   No current facility-administered medications on file prior to visit.     Past Medical History  Diagnosis Date  . Palpitations   . Obesity   . CARDIOMYOPATHY     Nonischemic. Admitted in 3/11 with CHF exac, due to hyperthyroidism. LHC (03/11) showed clean coronaries with EF 40%. Myoview showed EF 38%. Echo was a technically difficult study with mild to moderately decreased EF, mild LVH, mild MR. Repeat echo (9/11) with EF 0000000, mild diastolic dysfunction (grade I).  . Congestive heart failure, unspecified   . Osteoarthritis     esp L hip, low back  . ALLERGIC RHINITIS   . DIABETES MELLITUS, CONTROLLED   . DYSLIPIDEMIA   . HYPERTENSION   . HYPERTHYROIDISM   . CARPAL TUNNEL SYNDROME, LEFT, MILD   . Graves disease   . CKD (chronic kidney disease), stage III    Allergies  Allergen Reactions  . Amitriptyline Other (See Comments)    Leg cramps     Social History   Social History  . Marital Status: Widowed    Spouse Name: N/A  . Number of Children: N/A  . Years of Education: N/A   Social History Main Topics  . Smoking status: Never Smoker   . Smokeless tobacco:  None  . Alcohol Use: No  . Drug Use: No  . Sexual Activity: Not Asked   Other Topics Concern  . None   Social History Narrative   Lives in [place] with her son.   Widowed since 1992.   She is very sedentary, uses a wheelchair when out of the house.   Ambulatory in home with cane.    Filed Vitals:   08/17/15 1314  BP: 130/70  Pulse: 73  Temp: 97.8 F (36.6 C)  Resp: 12   There is no weight on file to calculate BMI.  SpO2 Readings from Last 3 Encounters:  08/17/15 96%  07/14/15 96%  06/07/15 96%   SpO2 Readings from Last 3 Encounters:  08/17/15 96%  07/14/15 96%  06/07/15 96%        Physical  Exam  Nursing note and vitals reviewed. Constitutional: She is oriented to person, place, and time. She appears well-developed. No distress.  HENT:  Head: Atraumatic.  Nose: Rhinorrhea present. Right sinus exhibits no maxillary sinus tenderness and no frontal sinus tenderness. Left sinus exhibits no maxillary sinus tenderness and no frontal sinus tenderness.  Mouth/Throat: Oropharynx is clear and moist and mucous membranes are normal.  Mild dysphonia. Postnasal drainage.  Eyes: Conjunctivae and EOM are normal. Pupils are equal, round, and reactive to light.  Cardiovascular: Normal rate.  An irregular rhythm present.  No murmur heard. Pulses:      Dorsalis pedis pulses are 2+ on the right side, and 2+ on the left side.  Respiratory: Effort normal and breath sounds normal. No respiratory distress.  GI: Soft. There is no tenderness.  Musculoskeletal: She exhibits no edema.  Lymphadenopathy:    She has no cervical adenopathy.  Neurological: She is alert and oriented to person, place, and time. She has normal strength. Coordination normal.  In a wheel chair  Skin: Skin is warm. No erythema.  Psychiatric: She has a normal mood and affect.  Well groomed, good eye contact.      ASSESSMENT AND PLAN:    Katie Higgins was seen today for fatigue.  Diagnoses and all orders  for this visit:  Bradycardia  Resolved. Decrease carvedilol as instructed by cardiologist. Continue monitoring heart rate, instructed about warning signs.  Irregular heart rate  EKG today SR, + PVC's, LAD, ? LAHB. EKG 05/2015 PVC's and LAD now present.  AVR +, so lead may not be put on correctly but it was hard to obtain EKG due to unable to get on table and body habits.  Pick up Holter as instructed. Instructed about warning signs.  -     EKG 12-Lead -     Basic metabolic panel  URI, acute  Respiratory symptoms reported today could be related with mild viral URI. Monitor for new symptoms and fever. I do not recommend cold medications over-the-counter. She could use over-the-counter Flonase or Rhinocort nasal spray as far as there is not a history of glaucoma. Saline nasal solution may help.  Cough seems to be chronic, according to information provided by daughter. ? GERD,allergies among some. Follow-up in 3-4 weeks with PCP, before if needed.      -Katie Higgins advised to return or notify a doctor immediately if symptoms worsen or persist or new concerns arise, she voices understanding.       Katie Stawicki G. [place], MD  [facility]. [place] office.

## 2015-08-17 NOTE — Telephone Encounter (Signed)
Pt has been scheduled with Dr Betty [place] [facility] today at 1:30PM, pt advised.   Pt aware to hold coreg today, start 1/2 of a 25mg  coreg bid tomorrow. Pt advised she should be contacted by our office to schedule 48 hour holter monitor later this week. Pt advised she has been scheduled to see Truitt Merle, NP here 08/25/15 at 11:30AM.  Pt advised to keep BP and HR log and bring it to appt with Cecille Rubin next week.   Pt verbalized understanding.

## 2015-08-17 NOTE — Telephone Encounter (Signed)
Pt called, see phone note 08/17/15

## 2015-08-18 ENCOUNTER — Encounter: Payer: Self-pay | Admitting: Family Medicine

## 2015-08-18 ENCOUNTER — Other Ambulatory Visit: Payer: Self-pay | Admitting: Physician Assistant

## 2015-08-18 DIAGNOSIS — R001 Bradycardia, unspecified: Secondary | ICD-10-CM

## 2015-08-18 DIAGNOSIS — I499 Cardiac arrhythmia, unspecified: Secondary | ICD-10-CM

## 2015-08-19 ENCOUNTER — Ambulatory Visit (INDEPENDENT_AMBULATORY_CARE_PROVIDER_SITE_OTHER): Payer: Medicare Other

## 2015-08-19 DIAGNOSIS — R001 Bradycardia, unspecified: Secondary | ICD-10-CM | POA: Diagnosis not present

## 2015-08-19 DIAGNOSIS — I499 Cardiac arrhythmia, unspecified: Secondary | ICD-10-CM | POA: Diagnosis not present

## 2015-08-24 ENCOUNTER — Encounter: Payer: Self-pay | Admitting: *Deleted

## 2015-08-25 ENCOUNTER — Encounter: Payer: Self-pay | Admitting: Nurse Practitioner

## 2015-08-25 ENCOUNTER — Ambulatory Visit (INDEPENDENT_AMBULATORY_CARE_PROVIDER_SITE_OTHER): Payer: Medicare Other | Admitting: Nurse Practitioner

## 2015-08-25 VITALS — BP 128/70 | HR 84 | Ht 62.0 in | Wt 262.0 lb

## 2015-08-25 DIAGNOSIS — I493 Ventricular premature depolarization: Secondary | ICD-10-CM

## 2015-08-25 LAB — BASIC METABOLIC PANEL
BUN: 23 mg/dL (ref 7–25)
CO2: 24 mmol/L (ref 20–31)
Calcium: 8.6 mg/dL (ref 8.6–10.4)
Chloride: 106 mmol/L (ref 98–110)
Creat: 1.44 mg/dL — ABNORMAL HIGH (ref 0.50–0.99)
Glucose, Bld: 119 mg/dL — ABNORMAL HIGH (ref 65–99)
Potassium: 4.5 mmol/L (ref 3.5–5.3)
Sodium: 143 mmol/L (ref 135–146)

## 2015-08-25 LAB — MAGNESIUM: Magnesium: 1.9 mg/dL (ref 1.5–2.5)

## 2015-08-25 MED ORDER — CARVEDILOL 25 MG PO TABS: 25.0000 mg | ORAL_TABLET | Freq: Two times a day (BID) | ORAL | Status: DC

## 2015-08-25 NOTE — Patient Instructions (Addendum)
We will be checking the following labs today - BMET & Mg  Medication Instructions:    Continue with your current medicines. BUT  I am increasing the Coreg back to 25 mg twice a day    Testing/Procedures To Be Arranged:  N/A  Follow-Up:   See Dr. [name] as planned.     Other Special Instructions:   Monitor the heart rate by actually checking the heart beat for one whole minute    If you need a refill on your cardiac medications before your next appointment, please call your pharmacy.   Call the [place] office at [phone] if you have any questions, problems or concerns.

## 2015-08-25 NOTE — Progress Notes (Signed)
CARDIOLOGY OFFICE NOTE  Date:  08/25/2015    Katie Higgins Date of Birth: 09-25-1950 Medical Record [id number]  PCP:  Hoyt Koch, MD  Cardiologist:  Annabell Howells    Chief Complaint  Patient presents with  . Palpitations    Work in visit - seen for Dr. [name]    History of Present Illness: Katie Higgins is a 65 y.o. female who presents today for a work in visit. Seen for Dr. [name].   She was admitted to [facility] in 3/11 with shortness of breath. She was found to have a CHF exacerbation. TSH was noted to be suppressed and free T4 was very high. Patient was begun on methimazole and diuresed. Echo was a difficult study but showed mild to moderately decreased EF. Myoview showed EF 38% with multiple fixed deficits. Left heart cath showed no significant coronary disease with EF 40%. It was thought that her cardiomyopathy may be the result of hyperthyroidism. Hyperthyroidism was treated by Dr. [name]. Repeat echo in 9/11 showed EF 55-60%.   She was last seen a year ago. Very limited by her arthritis. Cardiac status seemed ok.   Phone call last week -  Pt has been scheduled with Dr Betty Martinique [facility] today at 1:30PM, pt advised.   Pt aware to hold coreg today, start 1/2 of a 25mg  coreg bid tomorrow. Pt advised she should be contacted by our office to schedule 48 hour holter monitor later this week. Pt advised she has been scheduled to see Truitt Merle, NP here 08/25/15 at 11:30AM.  Pt advised to keep BP and HR log and bring it to appt with Cecille Rubin next week.   Pt verbalized understanding.             Katrine Coho, RN at 08/17/2015 9:02 AM     Status: Signed       Expand All Collapse All   Reviewed with Richardson Dopp, PA-  Per Nicki Reaper-  See Primary Care today Hold coreg today Decrease coreg to 12.5mg  bid tomorrow 48 hour Holter later this week Appt here next week             Katrine Coho, RN at 08/17/2015  8:28 AM     Status: Signed       Expand All Collapse All   Pt states she took her coreg and lovastatin at 10 PM last night.  Pt states she woke up with tremor/shaking all over about 5:30 AM. Pt states she was restless during the night and was up and down all night. Pt states heart rate was 43 and BP was 132/83. Pt states at 8:15AM HR and BP 133/96 75.  Pt states her coreg 25mg  was decreased in March to 1/2 bid when she was hospitalized with sepsis.   Pt states about 2 weeks after hospitalization in March she increased coreg back to 1 tablet two times a day on her own.  Pt states she has not checked HR and BP before this morning. Pt states she is asymptomatic at this time except for congested nose. Pt states it is time for her to take coreg and she is asking if she should take it.       Thus added to my schedule.  Comes in today. Here with family member. She says she is doing ok. Family says she woke up last Thursday and complained of feeling "trembly" in side. This precipitated checking BP and HR. Has  noted low HR's in the 40's and irregularity. Thus the Coreg was cut back and Holter placed a day or so later. Holter did not show bradycardia. Average HR 86. She was tachycardic. Had 30% PVC burden. Family feels like she has had some dizzy spells. BP all over the place as well as the HR. No apical pulses obtained. Not really short of breath. She remains wheelchair bound. She continues to lose weight. She was admitted back in April with sepsis and recovered. Recent labs noted. TSH now ok.   Past Medical History:  1. DM: diet-controlled  2. HYPERTENSION  3. HYPERTHYROIDISM ( 4. CARDIOMYOPATHY: Nonischemic. Admitted in 3/11 with CHF exac, due to hyperthyroidism. LHC (3/11) showed clean coronaries with EF 40%. Myoview showed EF 38%. Echo was a technically difficult study with mild to moderately decreased EF, mild LVH, mild MR. Repeat echo (9/11) with EF 0000000, mild diastolic dysfunction  (grade I).  5. Osteoarthritis - esp L hip, low back  6. Obesity  7. s/p CCY  8. CKD  Past Medical History  Diagnosis Date  . Palpitations   . Obesity   . CARDIOMYOPATHY     Nonischemic. Admitted in 3/11 with CHF exac, due to hyperthyroidism. LHC (03/11) showed clean coronaries with EF 40%. Myoview showed EF 38%. Echo was a technically difficult study with mild to moderately decreased EF, mild LVH, mild MR. Repeat echo (9/11) with EF 0000000, mild diastolic dysfunction (grade I).  . Congestive heart failure, unspecified   . Osteoarthritis     esp L hip, low back  . ALLERGIC RHINITIS   . DIABETES MELLITUS, CONTROLLED   . DYSLIPIDEMIA   . HYPERTENSION   . HYPERTHYROIDISM   . CARPAL TUNNEL SYNDROME, LEFT, MILD   . Graves disease   . CKD (chronic kidney disease), stage III     Past Surgical History  Procedure Laterality Date  . Cholecystectomy    . Remote hernia repair    . Cardiac catheterization  04/19/09     Medications: Current Outpatient Prescriptions  Medication Sig Dispense Refill  . aspirin 81 MG tablet Take 81 mg by mouth daily.      . colchicine 0.6 MG tablet Take 1 tablet (0.6 mg total) by mouth daily. (Patient taking differently: Take 0.6 mg by mouth daily as needed (gout). ) 90 tablet 0  . cyclobenzaprine (FLEXERIL) 10 MG tablet Take 1 tablet (10 mg total) by mouth every 8 (eight) hours as needed for muscle spasms. 90 tablet 0  . furosemide (LASIX) 20 MG tablet Take 1 tablet (20 mg total) by mouth daily. (Patient taking differently: Take 20 mg by mouth daily as needed for fluid or edema. ) 90 tablet 3  . lovastatin (MEVACOR) 10 MG tablet Take 1 tablet (10 mg total) by mouth at bedtime. 90 tablet 0  . methimazole (TAPAZOLE) 5 MG tablet Take 1 tablet (5 mg total) by mouth daily. 90 tablet 2  . Multiple Vitamin (MULTIVITAMIN) tablet Take 1 tablet by mouth daily.      . ondansetron (ZOFRAN) 8 MG tablet Take 1 tablet (8 mg total) by mouth every 8 (eight) hours as  needed for nausea or vomiting. 90 tablet 3  . traMADol (ULTRAM) 50 MG tablet Take 1 tablet (50 mg total) by mouth every 6 (six) hours as needed. 180 tablet 0  . carvedilol (COREG) 25 MG tablet Take 1 tablet (25 mg total) by mouth 2 (two) times daily. 180 tablet 3   No current facility-administered medications for this  visit.    Allergies: Allergies  Allergen Reactions  . Amitriptyline Other (See Comments)    Leg cramps     Social History: The patient  reports that she has never smoked. She does not have any smokeless tobacco history on file. She reports that she does not drink alcohol or use illicit drugs.   Family History: The patient's family history includes Breast cancer in her mother; Cervical cancer in her mother; Diabetes in her mother; Kidney cancer in her father; Stroke in her father; Supraventricular tachycardia in her sister.   Review of Systems: Please see the history of present illness.   Otherwise, the review of systems is positive for none.   All other systems are reviewed and negative.   Physical Exam: VS:  BP 128/70 mmHg  Pulse 84  Ht 5\' 2"  (1.575 m)  Wt 262 lb (118.842 kg)  BMI 47.91 kg/m2 .  BMI Body mass index is 47.91 kg/(m^2).  Wt Readings from Last 3 Encounters:  08/25/15 262 lb (118.842 kg)  07/14/15 270 lb (122.471 kg)  05/24/15 275 lb 6.4 oz (124.921 kg)    General: Pleasant. Obese. Looks chronically ill but alert and in no acute distress.  HEENT: Normal but with poor dentition. Neck: Supple, no JVD, carotid bruits, or masses noted.  Cardiac: Regular rate and rhythm. Frequent ectopics noted.  No edema.  Respiratory:  Lungs are clear to auscultation bilaterally with normal work of breathing.  GI: Soft and nontender.  MS: No deformity or atrophy. Gait not tested. She is wheelchair bound Skin: Warm and dry. Color is somewhat sallow Neuro:  Strength and sensation are intact and no gross focal deficits noted.  Psych: Alert, appropriate and with normal  affect.   LABORATORY DATA:  EKG:  EKG is ordered today. This demonstrates NSR with PVC - look to be unifocal.  Lab Results  Component Value Date   WBC 5.6 05/22/2015   HGB 9.8* 05/22/2015   HCT 29.9* 05/22/2015   PLT 150 05/22/2015   GLUCOSE 114* 08/17/2015   CHOL 124 09/08/2014   TRIG 92.0 09/08/2014   HDL 53.50 09/08/2014   LDLCALC 52 09/08/2014   ALT 36 05/21/2015   AST 35 05/21/2015   NA 140 08/17/2015   K 3.9 08/17/2015   CL 106 08/17/2015   CREATININE 1.44* 08/17/2015   BUN 25* 08/17/2015   CO2 26 08/17/2015   TSH 2.52 07/14/2015   INR 1.26 05/21/2015   HGBA1C 5.4 09/08/2014    BNP (last 3 results)  Recent Labs  05/20/15 0554  BNP 93.1    ProBNP (last 3 results) No results for input(s): PROBNP in the last 8760 hours.   Other Studies Reviewed Today: Abnormal 48 hour holter monitor results available for your review.  PVC burden 30.0%.  1 run SVT, 26 beats long, 147 bpm.   Patient has appointment with Truitt Merle, NP, 08/25/15, 11:30 AM.  Patient has appointment with Dr. [name], 09/10/15, 3:00 PM.    Thank you, Shelly    Echo Study Conclusions from 05/2015  - Left ventricle: The cavity size was normal. Wall thickness was  increased in a pattern of mild LVH. Systolic function was normal.  The estimated ejection fraction was in the range of 60% to 65%.  Wall motion was normal; there were no regional wall motion  abnormalities. Features are consistent with a pseudonormal left  ventricular filling pattern, with concomitant abnormal relaxation  and increased filling pressure (grade 2 diastolic dysfunction). - Mitral valve: Mildly  calcified annulus. - Right atrium: The atrium was mildly dilated.   Assessment/Plan: 1. Frequent PVCs/reported bradycardia - Holter shows 30% PVC burden but no bradycardia. Average HR 86 with max 156. Would increase the Coreg back to 25 mg BID. May need to re holter her to reevaluate her PVC burden on full dose Coreg.  Echo from April with normal EF. Recheck her chemistries today.   2. Morbid obesity: This is her single biggest problem at this time. She is continuing to lose weight.  3. Cardiomyopathy: Resolved after treatment of hyperthyroidism. Stable exertional symptoms (mostly limited by orthopedic problems). She looks near-euvolemic taking Lasix three times a week. Recent echo remains with normal EF.  4. CKD: Creatinine has been stable.   Current medicines are reviewed with the patient today.  The patient does not have concerns regarding medicines other than what has been noted above.  The following changes have been made:  See above.  Labs/ tests ordered today include:    Orders Placed This Encounter  Procedures  . Basic metabolic panel  . Magnesium     Disposition:   FU with Dr. [name] as planned in early August. Defer repeat Holter to him.  Patient is agreeable to this plan and will call if any problems develop in the interim.   Signed: Burtis Junes, RN, ANP-C 08/25/2015 12:19 PM  [facility] [address] [place], [place]  [address] Phone: [phone] Fax: [phone]

## 2015-08-31 ENCOUNTER — Other Ambulatory Visit: Payer: Self-pay | Admitting: *Deleted

## 2015-08-31 DIAGNOSIS — I493 Ventricular premature depolarization: Secondary | ICD-10-CM

## 2015-09-06 NOTE — Addendum Note (Signed)
Addended by: Freada Bergeron on: 09/06/2015 05:48 PM   Modules accepted: Orders

## 2015-09-08 ENCOUNTER — Ambulatory Visit (INDEPENDENT_AMBULATORY_CARE_PROVIDER_SITE_OTHER): Payer: Medicare Other

## 2015-09-08 DIAGNOSIS — I493 Ventricular premature depolarization: Secondary | ICD-10-CM | POA: Diagnosis not present

## 2015-09-09 ENCOUNTER — Encounter: Admitting: Internal Medicine

## 2015-09-10 ENCOUNTER — Ambulatory Visit (INDEPENDENT_AMBULATORY_CARE_PROVIDER_SITE_OTHER): Payer: Medicare Other | Admitting: Cardiology

## 2015-09-10 ENCOUNTER — Encounter: Payer: Self-pay | Admitting: *Deleted

## 2015-09-10 VITALS — BP 128/76 | HR 71 | Ht 62.0 in | Wt 271.0 lb

## 2015-09-10 DIAGNOSIS — I493 Ventricular premature depolarization: Secondary | ICD-10-CM | POA: Diagnosis not present

## 2015-09-10 DIAGNOSIS — I5032 Chronic diastolic (congestive) heart failure: Secondary | ICD-10-CM | POA: Diagnosis not present

## 2015-09-10 MED ORDER — CARVEDILOL 25 MG PO TABS: 25.0000 mg | ORAL_TABLET | Freq: Two times a day (BID) | ORAL | 3 refills | Status: DC

## 2015-09-10 NOTE — Patient Instructions (Signed)
Medication Instructions:  Your physician recommends that you continue on your current medications as directed. Please refer to the Current Medication list given to you today.   Labwork: none  Testing/Procedures: Your physician has requested that you have a lexiscan myoview. For further information please visit [email or web address]. Please follow instruction sheet, as given.    Follow-Up: Your physician recommends that you schedule a follow-up appointment in: 2 months with Dr [name].        If you need a refill on your cardiac medications before your next appointment, please call your pharmacy.

## 2015-09-12 ENCOUNTER — Encounter: Payer: Self-pay | Admitting: Cardiology

## 2015-09-12 DIAGNOSIS — I493 Ventricular premature depolarization: Secondary | ICD-10-CM | POA: Insufficient documentation

## 2015-09-12 NOTE — Progress Notes (Signed)
Patient ID: Katie Higgins, female   DOB: 1950/10/29, 65 y.o.   MRN: [id number] PCP: Dr. Martinique  65 yo was admitted to [facility] in 3/11 with shortness of breath. She was found to have a CHF exacerbation. TSH was noted to be suppressed and free T4 was very high. Patient was begun on methimazole and diuresed. Echo was a difficult study but showed mild to moderately decreased EF. Myoview showed EF 38% with multiple fixed deficits. Left heart cath showed no significant coronary disease with EF 40%. It was thought that her cardiomyopathy may be the result of hyperthyroidism. Hyperthyroidism is now being treated with methimazole. Repeat echo in 9/11 showed EF 55-60%.  Last echo in 4/17 showed EF 60-65% with grade II diastolic dysfunction.    She has been in a wheelchair when outside the house for a long time due to low back, knee, and hip arthritis. She has refused total hip replacement in the past. She is able to walk short distances in her house without dyspnea with her walker. Some dyspnea with housework, but this is stable.  No chest pain. She is taking Lasix three times a week. She had been feeling weak/dizzy about a month ago and saw Truitt Merle.  Holter monitor was done, showing 30% PVCs. Her Coreg was increased to 25 mg bid, and holter was repeated => now she has a PVC burden of 36% of total beats.  She currently does not feel weakness/dizziness/palpitations.   Labs (3/11): K 4, creatinine 0.7, BNP 603=>230, free T4 3.2 (high), free T3 8.9 (high), TSH 0.008, LDL 49, HDL 45, HCT 33.5  Labs (4/11): k 3.8, creatinine 0.8, BNP 47  Labs (8/11): creatinine 1.3  Labs (8/12): K 4.8, creatinine 1.13 Labs (12/12): TSH normal Labs (4/13): LDL 59, HDL 43 Labs (10/13): K 5.5, creatinine 1.4 Labs (1/14): TSH normal Labs (4/14): LDL 60, HDL 38 Labs (1/15): TSH normal, K 4.5, creatinine 1.6 Labs (5/16): TSH normal Labs (8/16): LDL 52, HDL 53, K 5, creatinine 1.27 Labs (6/17): TSH normal Labs (7/17): K 4.8,  creatinine 1.44  ECG: NSR, normal  Allergies (verified):  1) ! * Hydrocodone/apap   Past Medical History:  1. DM: diet-controlled  2. HYPERTENSION   3. HYPERTHYROIDISM  4. CARDIOMYOPATHY: Nonischemic. Admitted in 3/11 with CHF exac, due to hyperthyroidism. LHC (3/11) showed clean coronaries with EF 40%. Myoview showed EF 38%. Echo was a technically difficult study with mild to moderately decreased EF, mild LVH, mild MR. Repeat echo (9/11) with EF 0000000, mild diastolic dysfunction (grade I).  5. Osteoarthritis - esp L hip, low back  6. Obesity  7. s/p CCY  8. CKD 9. PVCs: Holter in 7/17 with 30% PVCs and short run of SVT.  Holter in 8/17 on increased Coreg with 36% PVCs.    Family History:  No premature CAD  mom - expired age 66y - DM, breast cancer, cervical cancer  dad - expired age 28y - CVA, kidney cancer   Social History:  Lives in [place] with her son.  widowed since 1992  She is very sedentary, uses a wheelchair when out of the house.  ambulatory in home with cane  No smoking or ETOH.   ROS: All systems reviewed and negative except as per HPI.   Current Outpatient Prescriptions  Medication Sig Dispense Refill  . aspirin 81 MG tablet Take 81 mg by mouth daily.      . carvedilol (COREG) 25 MG tablet Take 1 tablet (25 mg total)  by mouth 2 (two) times daily. 180 tablet 3  . colchicine 0.6 MG tablet Take 0.6 mg by mouth daily as needed.    . cyclobenzaprine (FLEXERIL) 10 MG tablet Take 1 tablet (10 mg total) by mouth every 8 (eight) hours as needed for muscle spasms. 90 tablet 0  . furosemide (LASIX) 20 MG tablet Take 20 mg by mouth 3 (three) times a week.    . lovastatin (MEVACOR) 10 MG tablet Take 1 tablet (10 mg total) by mouth at bedtime. 90 tablet 0  . methimazole (TAPAZOLE) 5 MG tablet Take 1 tablet (5 mg total) by mouth daily. 90 tablet 2  . Multiple Vitamin (MULTIVITAMIN) tablet Take 1 tablet by mouth daily.      . ondansetron (ZOFRAN) 8 MG tablet Take 1 tablet  (8 mg total) by mouth every 8 (eight) hours as needed for nausea or vomiting. 90 tablet 3  . traMADol (ULTRAM) 50 MG tablet Take 1 tablet (50 mg total) by mouth every 6 (six) hours as needed. 180 tablet 0   No current facility-administered medications for this visit.     BP 128/76   Pulse 71   Ht 5\' 2"  (1.575 m)   Wt 271 lb (122.9 kg)   BMI 49.57 kg/m  General: NAD, morbidly obese.  Neck: No JVD, no thyromegaly or thyroid nodule.  Lungs: Clear to auscultation bilaterally with normal respiratory effort. CV: Nondisplaced PMI.  Heart regular S1/S2, no S3/S4, no murmur.  No edema.  No carotid bruit.  Normal pedal pulses.  Abdomen: Soft, nontender, no hepatosplenomegaly, no distention.   Neurologic: Alert and oriented x 3.  Psych: Normal affect. Extremities: No clubbing or cyanosis.   Assessment/Plan:  1. Morbid obesity: She is limited orthopedically, so I am not sure she is going to be able to increase her exercise level.  She has worked on changing her diet and has lost some weight though she has a ways to go. 2. Cardiomyopathy: Resolved after treatment of hyperthyroidism.  Stable exertional symptoms (mostly limited by orthopedic problems).  She looks euvolemic taking Lasix three times a week. Echo in 4/17 showed that EF remains normal. 3. Hyperlipidemia: Good lipids 8/16.  4. CKD: Creatinine has been stable.  5. PVCs: > 30% PVC burden on recent holter.  I'm not sure that she feels much different symptomatically. EF 60-65% on recent echo.  Increasing Coreg recently to 25 mg bid does not appear to have had any effect on her PVCs. TSH was normal in 6/17.  - Lexiscan Cardiolite to rule out ischemia.  - If Cardiolite is normal, she will need referral to EP.   Loralie Champagne 09/12/2015

## 2015-09-14 ENCOUNTER — Telehealth (HOSPITAL_COMMUNITY): Payer: Self-pay | Admitting: *Deleted

## 2015-09-14 NOTE — Telephone Encounter (Signed)
Left message on voicemail in reference to upcoming appointment scheduled for 09/15/15. Phone number given for a call back so details instructions can be given. Trevar Boehringer W   

## 2015-09-15 ENCOUNTER — Ambulatory Visit (HOSPITAL_COMMUNITY): Payer: Medicare Other | Attending: Cardiology

## 2015-09-15 DIAGNOSIS — R0609 Other forms of dyspnea: Secondary | ICD-10-CM | POA: Insufficient documentation

## 2015-09-15 DIAGNOSIS — I493 Ventricular premature depolarization: Secondary | ICD-10-CM | POA: Diagnosis not present

## 2015-09-15 DIAGNOSIS — R9439 Abnormal result of other cardiovascular function study: Secondary | ICD-10-CM | POA: Diagnosis not present

## 2015-09-15 DIAGNOSIS — E119 Type 2 diabetes mellitus without complications: Secondary | ICD-10-CM | POA: Insufficient documentation

## 2015-09-15 DIAGNOSIS — I110 Hypertensive heart disease with heart failure: Secondary | ICD-10-CM | POA: Insufficient documentation

## 2015-09-15 DIAGNOSIS — I5032 Chronic diastolic (congestive) heart failure: Secondary | ICD-10-CM

## 2015-09-15 MED ORDER — TECHNETIUM TC 99M TETROFOSMIN IV KIT
31.9000 | PACK | Freq: Once | INTRAVENOUS | Status: AC | PRN
  Administered 2015-09-15: 31.9 via INTRAVENOUS
  Filled 2015-09-15: qty 32

## 2015-09-15 MED ORDER — REGADENOSON 0.4 MG/5ML IV SOLN
0.4000 mg | Freq: Once | INTRAVENOUS | Status: AC
  Administered 2015-09-15: 0.4 mg via INTRAVENOUS

## 2015-09-16 ENCOUNTER — Ambulatory Visit (HOSPITAL_COMMUNITY): Payer: Medicare Other | Attending: Cardiovascular Disease

## 2015-09-16 LAB — MYOCARDIAL PERFUSION IMAGING
CHL CUP NUCLEAR SSS: 6
CHL CUP RESTING HR STRESS: 76 {beats}/min
LHR: 0.28
LVDIAVOL: 93 mL (ref 46–106)
LVSYSVOL: 46 mL
Peak HR: 103 {beats}/min
SDS: 6
SRS: 0
TID: 0.74

## 2015-09-16 MED ORDER — TECHNETIUM TC 99M TETROFOSMIN IV KIT
32.6000 | PACK | Freq: Once | INTRAVENOUS | Status: AC | PRN
  Administered 2015-09-16: 33 via INTRAVENOUS
  Filled 2015-09-16: qty 33

## 2015-09-20 ENCOUNTER — Telehealth: Payer: Self-pay | Admitting: Cardiology

## 2015-09-20 ENCOUNTER — Encounter: Payer: Self-pay | Admitting: *Deleted

## 2015-09-20 DIAGNOSIS — R0602 Shortness of breath: Secondary | ICD-10-CM

## 2015-09-20 DIAGNOSIS — I493 Ventricular premature depolarization: Secondary | ICD-10-CM

## 2015-09-20 NOTE — Telephone Encounter (Signed)
NEW MESSAGE   Pt is calling to schedule her procedure with Dr.McLean  Pt verbalized that rn is aware and she is returning missed call

## 2015-09-20 NOTE — Telephone Encounter (Signed)
Pt advised LHC has been scheduled ([facility] lab) for 09/30/15 10:30AM, arrive 8:30AM Dr [name]. Instructions for LHC reviewed with pt over the telephone, copy left at front desk for pt to pick up when she comes for lab 09/24/15. InBasket message to pre-cert.

## 2015-09-21 ENCOUNTER — Encounter (HOSPITAL_COMMUNITY): Payer: Self-pay | Admitting: *Deleted

## 2015-09-21 ENCOUNTER — Ambulatory Visit (HOSPITAL_COMMUNITY)
Admission: EM | Admit: 2015-09-21 | Discharge: 2015-09-21 | Disposition: A | Discharge status: Home / Self Care | Payer: Medicare Other | Attending: Family Medicine | Admitting: Family Medicine

## 2015-09-21 DIAGNOSIS — Z23 Encounter for immunization: Secondary | ICD-10-CM | POA: Diagnosis not present

## 2015-09-21 DIAGNOSIS — S60222A Contusion of left hand, initial encounter: Secondary | ICD-10-CM

## 2015-09-21 MED ORDER — TETANUS-DIPHTH-ACELL PERTUSSIS 5-2.5-18.5 LF-MCG/0.5 IM SUSP
INTRAMUSCULAR | Status: AC
  Filled 2015-09-21: qty 0.5

## 2015-09-21 MED ORDER — TETANUS-DIPHTH-ACELL PERTUSSIS 5-2.5-18.5 LF-MCG/0.5 IM SUSP
0.5000 mL | Freq: Once | INTRAMUSCULAR | Status: AC
  Administered 2015-09-21: 0.5 mL via INTRAMUSCULAR

## 2015-09-21 NOTE — ED Triage Notes (Signed)
Pt  Reports    She  Injured  Her l   Arm  On  W/c   sev  Days   Ago   And  Has  Some  Swelling present    And  She  Noticed  Some  Bruising  And  Swelling  To  The  Top  Of his  l  Hand     Today

## 2015-09-21 NOTE — ED Provider Notes (Signed)
[facility]    CSN: [id number] Arrival date & time: 09/21/15  [id number]  First Provider Contact:  First MD Initiated Contact with Patient 09/21/15 1925        History   Chief Complaint Chief Complaint  Patient presents with  . Arm Injury    HPI Katie Higgins is a 65 y.o. female.   The history is provided by the patient.  Arm Injury  Location:  Hand Hand location:  [place] of L hand Injury: no   Pain details:    Quality:  Sharp   Radiates to:  Does not radiate   Severity:  Mild   Onset quality:  Sudden   Progression:  Unchanged Dislocation: no   Foreign body present:  No foreign bodies Tetanus status:  Out of date Prior injury to area:  Yes (contusion left forearm 2d ago. spont hematoma to hand today.)   Past Medical History:  Diagnosis Date  . ALLERGIC RHINITIS   . CARDIOMYOPATHY    Nonischemic. Admitted in 3/11 with CHF exac, due to hyperthyroidism. LHC (03/11) showed clean coronaries with EF 40%. Myoview showed EF 38%. Echo was a technically difficult study with mild to moderately decreased EF, mild LVH, mild MR. Repeat echo (9/11) with EF 0000000, mild diastolic dysfunction (grade I).  . CARPAL TUNNEL SYNDROME, LEFT, MILD   . CKD (chronic kidney disease), stage III   . Congestive heart failure, unspecified   . DIABETES MELLITUS, CONTROLLED   . DYSLIPIDEMIA   . Graves disease   . HYPERTENSION   . HYPERTHYROIDISM   . Obesity   . Osteoarthritis    esp L hip, low back  . Palpitations     Patient Active Problem List   Diagnosis Date Noted  . PVC's (premature ventricular contractions) 09/12/2015  . Sepsis ([place]) 05/20/2015  . Hyperkalemia 05/20/2015  . Cough 05/20/2015  . Nausea vomiting and diarrhea 05/20/2015  . CKD (chronic kidney disease), stage III   . Viral syndrome   . CKD (chronic kidney disease) stage 3, GFR 30-59 ml/min 09/08/2014  . Routine general medical examination at a health care facility 09/08/2014  . Generalized pain 02/04/2014    . Ganglion cyst of wrist 02/04/2014  . Fever 01/14/2014  . Lumbar radiculopathy 12/24/2013  . Osteoarthritis of right knee 12/17/2012  . Pain in lateral portion of right knee 11/20/2012  . Arthritis of left knee 11/20/2012  . Insomnia   . Thyroid nodule 02/20/2012  . Morbid obesity ([place]) 05/07/2011  . Diabetes mellitus type 2, controlled, with complications ([place]) 99991111  . HLD (hyperlipidemia) 06/04/2009  . ARTHRITIS, LEFT HIP 06/04/2009  . PALPITATIONS 06/02/2009  . Thyrotoxicosis 05/17/2009  . Essential hypertension 05/17/2009  . CARDIOMYOPATHY 05/17/2009  . Congestive heart failure ([place]) 05/17/2009    Past Surgical History:  Procedure Laterality Date  . CARDIAC CATHETERIZATION  04/19/09  . CHOLECYSTECTOMY    . remote hernia repair      OB History    No data available       Home Medications    Prior to Admission medications   Medication Sig Start Date End Date Taking? Authorizing Provider  aspirin 81 MG tablet Take 81 mg by mouth daily.      Historical Provider, MD  carvedilol (COREG) 25 MG tablet Take 1 tablet (25 mg total) by mouth 2 (two) times daily. 09/10/15   Larey Dresser, MD  colchicine 0.6 MG tablet Take 0.6 mg by mouth daily as needed.    Historical Provider,  MD  cyclobenzaprine (FLEXERIL) 10 MG tablet Take 1 tablet (10 mg total) by mouth every 8 (eight) hours as needed for muscle spasms. 04/13/15 04/12/16  Hoyt Koch, MD  furosemide (LASIX) 20 MG tablet Take 20 mg by mouth 3 (three) times a week.    Historical Provider, MD  lovastatin (MEVACOR) 10 MG tablet Take 1 tablet (10 mg total) by mouth at bedtime. 06/17/15   Larey Dresser, MD  methimazole (TAPAZOLE) 5 MG tablet Take 1 tablet (5 mg total) by mouth daily. 09/14/14   Renato Shin, MD  Multiple Vitamin (MULTIVITAMIN) tablet Take 1 tablet by mouth daily.      Historical Provider, MD  ondansetron (ZOFRAN) 8 MG tablet Take 1 tablet (8 mg total) by mouth every 8 (eight) hours as needed for nausea or  vomiting. 04/28/15   Hoyt Koch, MD  traMADol (ULTRAM) 50 MG tablet Take 1 tablet (50 mg total) by mouth every 6 (six) hours as needed. 06/18/15   [place], FNP    Family History Family History  Problem Relation Age of Onset  . Diabetes Mother   . Breast cancer Mother   . Cervical cancer Mother   . Kidney cancer Father     Kidney Cancer  . Stroke Father     CVA  . Supraventricular tachycardia Sister   . Asthma      family history    Social History Social History  Substance Use Topics  . Smoking status: Never Smoker  . Smokeless tobacco: Not on file  . Alcohol use No     Allergies   Amitriptyline   Review of Systems Review of Systems  Constitutional: Negative.   Musculoskeletal: Negative.   Skin: Positive for wound.  All other systems reviewed and are negative.    Physical Exam Triage Vital Signs ED Triage Vitals  Enc Vitals Group     BP 09/21/15 1905 153/70     Pulse Rate 09/21/15 1905 67     Resp 09/21/15 1905 12     Temp 09/21/15 1905 98.2 F (36.8 C)     Temp src --      SpO2 09/21/15 1905 100 %     Weight --      Height --      Head Circumference --      Peak Flow --      Pain Score 09/21/15 1926 2     Pain Loc --      Pain Edu? --      Excl. in [place]? --    No data found.   Updated Vital Signs BP 153/70   Pulse 67   Temp 98.2 F (36.8 C)   Resp 12   SpO2 100%   Visual Acuity Right Eye Distance:   Left Eye Distance:   Bilateral Distance:    Right Eye Near:   Left Eye Near:    Bilateral Near:     Physical Exam  Constitutional: She appears well-developed and well-nourished. She appears distressed.  Skin: Skin is warm and dry.  Benign hematoma to dorsum of left hand, mult abrasions and hematomas to forearm from injury.  Nursing note and vitals reviewed.    UC Treatments / Results  Labs (all labs ordered are listed, but only abnormal results are displayed) Labs Reviewed - No data to display  EKG  EKG  Interpretation None       Radiology No results found.  Procedures Procedures (including critical care time)  Medications  Ordered in UC Medications  Tdap (BOOSTRIX) injection 0.5 mL (not administered)     Initial Impression / Assessment and Plan / UC Course  I have reviewed the triage vital signs and the nursing notes.  Pertinent labs & imaging results that were available during my care of the patient were reviewed by me and considered in my medical decision making (see chart for details).  Clinical Course      Final Clinical Impressions(s) / UC Diagnoses   Final diagnoses:  Traumatic hematoma of left hand, initial encounter    New Prescriptions New Prescriptions   No medications on file     Billy Fischer, MD 09/21/15 [phone]

## 2015-09-24 ENCOUNTER — Other Ambulatory Visit: Payer: Self-pay | Admitting: Family

## 2015-09-24 ENCOUNTER — Other Ambulatory Visit: Payer: Self-pay | Admitting: Internal Medicine

## 2015-09-24 ENCOUNTER — Other Ambulatory Visit: Payer: Medicare Other

## 2015-09-24 ENCOUNTER — Ambulatory Visit: Payer: Medicare Other | Admitting: Cardiology

## 2015-09-24 DIAGNOSIS — M2550 Pain in unspecified joint: Secondary | ICD-10-CM

## 2015-09-27 ENCOUNTER — Other Ambulatory Visit: Payer: Medicare Other | Admitting: *Deleted

## 2015-09-27 ENCOUNTER — Encounter: Payer: Self-pay | Admitting: Cardiology

## 2015-09-27 ENCOUNTER — Encounter: Payer: Self-pay | Admitting: Endocrinology

## 2015-09-27 ENCOUNTER — Other Ambulatory Visit: Payer: Self-pay | Admitting: *Deleted

## 2015-09-27 DIAGNOSIS — R0602 Shortness of breath: Secondary | ICD-10-CM

## 2015-09-27 DIAGNOSIS — I5032 Chronic diastolic (congestive) heart failure: Secondary | ICD-10-CM

## 2015-09-27 DIAGNOSIS — I493 Ventricular premature depolarization: Secondary | ICD-10-CM | POA: Diagnosis not present

## 2015-09-27 LAB — BASIC METABOLIC PANEL
BUN: 22 mg/dL (ref 7–25)
CO2: 24 mmol/L (ref 20–31)
CREATININE: 1.37 mg/dL — AB (ref 0.50–0.99)
Calcium: 8.4 mg/dL — ABNORMAL LOW (ref 8.6–10.4)
Chloride: 107 mmol/L (ref 98–110)
Glucose, Bld: 111 mg/dL — ABNORMAL HIGH (ref 65–99)
Potassium: 5 mmol/L (ref 3.5–5.3)
Sodium: 141 mmol/L (ref 135–146)

## 2015-09-27 LAB — CBC WITH DIFFERENTIAL/PLATELET
BASOS PCT: 1 %
Basophils Absolute: 87 cells/uL (ref 0–200)
EOS PCT: 7 %
Eosinophils Absolute: 609 cells/uL — ABNORMAL HIGH (ref 15–500)
HCT: 37.2 % (ref 35.0–45.0)
Hemoglobin: 12 g/dL (ref 11.7–15.5)
LYMPHS PCT: 35 %
Lymphs Abs: 3045 cells/uL (ref 850–3900)
MCH: 27.9 pg (ref 27.0–33.0)
MCHC: 32.3 g/dL (ref 32.0–36.0)
MCV: 86.5 fL (ref 80.0–100.0)
MONOS PCT: 8 %
MPV: 10.3 fL (ref 7.5–12.5)
Monocytes Absolute: 696 cells/uL (ref 200–950)
Neutro Abs: 4263 cells/uL (ref 1500–7800)
Neutrophils Relative %: 49 %
PLATELETS: 303 10*3/uL (ref 140–400)
RBC: 4.3 MIL/uL (ref 3.80–5.10)
RDW: 15.5 % — AB (ref 11.0–15.0)
WBC: 8.7 10*3/uL (ref 3.8–10.8)

## 2015-09-27 LAB — PROTIME-INR
INR: 1
PROTHROMBIN TIME: 10.4 s (ref 9.0–11.5)

## 2015-09-27 MED ORDER — TRAMADOL HCL 50 MG PO TABS: 50.0000 mg | ORAL_TABLET | Freq: Two times a day (BID) | ORAL | 0 refills | Status: DC | PRN

## 2015-09-27 MED ORDER — CYCLOBENZAPRINE HCL 10 MG PO TABS: 10.0000 mg | ORAL_TABLET | Freq: Three times a day (TID) | ORAL | 0 refills | Status: DC | PRN

## 2015-09-27 MED ORDER — LOVASTATIN 10 MG PO TABS: 10.0000 mg | ORAL_TABLET | Freq: Every day | ORAL | 1 refills | Status: DC

## 2015-09-27 NOTE — Telephone Encounter (Signed)
Faxed script for Tramadol to [facility]...Katie Higgins

## 2015-09-28 ENCOUNTER — Other Ambulatory Visit: Payer: Self-pay

## 2015-09-28 ENCOUNTER — Encounter: Payer: Self-pay | Admitting: Internal Medicine

## 2015-09-28 ENCOUNTER — Telehealth: Payer: Self-pay | Admitting: *Deleted

## 2015-09-28 DIAGNOSIS — I493 Ventricular premature depolarization: Secondary | ICD-10-CM

## 2015-09-28 DIAGNOSIS — I5032 Chronic diastolic (congestive) heart failure: Secondary | ICD-10-CM

## 2015-09-28 MED ORDER — METHIMAZOLE 5 MG PO TABS: 5.0000 mg | ORAL_TABLET | Freq: Every day | ORAL | 2 refills | Status: DC

## 2015-09-28 MED ORDER — ENALAPRIL MALEATE 10 MG PO TABS: ORAL_TABLET | ORAL | 0 refills | Status: DC

## 2015-09-28 NOTE — Telephone Encounter (Signed)
If she has been taking it consistently, she can continue it.     ----- Message -----  From: Katrine Coho, RN  Sent: 09/28/2015  7:33 AM  To: Larey Dresser, MD  Subject: FW: Non-Urgent Medical Question           Dr [name] -she has been taking enalapril 10mg  daily-there are notes that she was to stop it when she was in the hospital in April 2017 but she said she never stopped taking it.   She is requesting a refill.  Do you want me to refill?

## 2015-09-30 ENCOUNTER — Ambulatory Visit (HOSPITAL_COMMUNITY)
Admission: RE | Admit: 2015-09-30 | Discharge: 2015-09-30 | Disposition: A | Discharge status: Home / Self Care | Payer: Medicare Other | Source: Ambulatory Visit | Attending: Cardiology | Admitting: Cardiology

## 2015-09-30 ENCOUNTER — Telehealth: Payer: Self-pay | Admitting: *Deleted

## 2015-09-30 ENCOUNTER — Encounter (HOSPITAL_COMMUNITY): Payer: Self-pay | Admitting: Cardiology

## 2015-09-30 ENCOUNTER — Encounter (HOSPITAL_COMMUNITY)
Admission: RE | Disposition: A | Discharge status: Home / Self Care | Payer: Self-pay | Source: Ambulatory Visit | Attending: Cardiology

## 2015-09-30 DIAGNOSIS — E785 Hyperlipidemia, unspecified: Secondary | ICD-10-CM | POA: Insufficient documentation

## 2015-09-30 DIAGNOSIS — Z8051 Family history of malignant neoplasm of kidney: Secondary | ICD-10-CM | POA: Diagnosis not present

## 2015-09-30 DIAGNOSIS — M179 Osteoarthritis of knee, unspecified: Secondary | ICD-10-CM | POA: Insufficient documentation

## 2015-09-30 DIAGNOSIS — N189 Chronic kidney disease, unspecified: Secondary | ICD-10-CM | POA: Diagnosis not present

## 2015-09-30 DIAGNOSIS — Z823 Family history of stroke: Secondary | ICD-10-CM | POA: Insufficient documentation

## 2015-09-30 DIAGNOSIS — I429 Cardiomyopathy, unspecified: Secondary | ICD-10-CM | POA: Insufficient documentation

## 2015-09-30 DIAGNOSIS — I509 Heart failure, unspecified: Secondary | ICD-10-CM | POA: Diagnosis not present

## 2015-09-30 DIAGNOSIS — E0590 Thyrotoxicosis, unspecified without thyrotoxic crisis or storm: Secondary | ICD-10-CM | POA: Insufficient documentation

## 2015-09-30 DIAGNOSIS — M1610 Unilateral primary osteoarthritis, unspecified hip: Secondary | ICD-10-CM | POA: Diagnosis not present

## 2015-09-30 DIAGNOSIS — Z833 Family history of diabetes mellitus: Secondary | ICD-10-CM | POA: Diagnosis not present

## 2015-09-30 DIAGNOSIS — I130 Hypertensive heart and chronic kidney disease with heart failure and stage 1 through stage 4 chronic kidney disease, or unspecified chronic kidney disease: Secondary | ICD-10-CM | POA: Diagnosis not present

## 2015-09-30 DIAGNOSIS — Z7982 Long term (current) use of aspirin: Secondary | ICD-10-CM | POA: Diagnosis not present

## 2015-09-30 DIAGNOSIS — Z803 Family history of malignant neoplasm of breast: Secondary | ICD-10-CM | POA: Insufficient documentation

## 2015-09-30 DIAGNOSIS — I493 Ventricular premature depolarization: Secondary | ICD-10-CM

## 2015-09-30 DIAGNOSIS — Z6841 Body Mass Index (BMI) 40.0 and over, adult: Secondary | ICD-10-CM | POA: Insufficient documentation

## 2015-09-30 DIAGNOSIS — R9439 Abnormal result of other cardiovascular function study: Secondary | ICD-10-CM | POA: Diagnosis not present

## 2015-09-30 DIAGNOSIS — E1122 Type 2 diabetes mellitus with diabetic chronic kidney disease: Secondary | ICD-10-CM | POA: Diagnosis not present

## 2015-09-30 DIAGNOSIS — M479 Spondylosis, unspecified: Secondary | ICD-10-CM | POA: Insufficient documentation

## 2015-09-30 HISTORY — PX: CARDIAC CATHETERIZATION: SHX172

## 2015-09-30 SURGERY — LEFT HEART CATH AND CORONARY ANGIOGRAPHY
Anesthesia: LOCAL

## 2015-09-30 MED ORDER — FENTANYL CITRATE (PF) 100 MCG/2ML IJ SOLN
INTRAMUSCULAR | Status: AC
  Filled 2015-09-30: qty 2

## 2015-09-30 MED ORDER — SODIUM CHLORIDE 0.9% FLUSH: 3.0000 mL | Freq: Two times a day (BID) | INTRAVENOUS | Status: DC

## 2015-09-30 MED ORDER — SODIUM CHLORIDE 0.9% FLUSH: 3.0000 mL | INTRAVENOUS | Status: DC | PRN

## 2015-09-30 MED ORDER — ACETAMINOPHEN 325 MG PO TABS: 650.0000 mg | ORAL_TABLET | ORAL | Status: DC | PRN

## 2015-09-30 MED ORDER — SODIUM CHLORIDE 0.9 % WEIGHT BASED INFUSION: 1.0000 mL/kg/h | INTRAVENOUS | Status: DC

## 2015-09-30 MED ORDER — HEPARIN SODIUM (PORCINE) 1000 UNIT/ML IJ SOLN
INTRAMUSCULAR | Status: AC
  Filled 2015-09-30: qty 1

## 2015-09-30 MED ORDER — IOPAMIDOL (ISOVUE-370) INJECTION 76%
INTRAVENOUS | Status: DC | PRN
  Administered 2015-09-30: 50 mL via INTRA_ARTERIAL

## 2015-09-30 MED ORDER — VERAPAMIL HCL 2.5 MG/ML IV SOLN
INTRAVENOUS | Status: DC | PRN
  Administered 2015-09-30: 10 mL via INTRA_ARTERIAL

## 2015-09-30 MED ORDER — HEPARIN (PORCINE) IN NACL 2-0.9 UNIT/ML-% IJ SOLN
INTRAMUSCULAR | Status: DC | PRN
  Administered 2015-09-30: 1000 mL

## 2015-09-30 MED ORDER — IOPAMIDOL (ISOVUE-370) INJECTION 76%
INTRAVENOUS | Status: AC
  Filled 2015-09-30: qty 100

## 2015-09-30 MED ORDER — LIDOCAINE HCL (PF) 1 % IJ SOLN
INTRAMUSCULAR | Status: DC | PRN
Start: 2015-09-30 — End: 2015-09-30
  Administered 2015-09-30: 2 mL via INTRADERMAL

## 2015-09-30 MED ORDER — SODIUM CHLORIDE 0.9 % IV SOLN: 250.0000 mL | INTRAVENOUS | Status: DC | PRN

## 2015-09-30 MED ORDER — SODIUM CHLORIDE 0.9 % WEIGHT BASED INFUSION
3.0000 mL/kg/h | INTRAVENOUS | Status: AC
  Administered 2015-09-30: 3 mL/kg/h via INTRAVENOUS

## 2015-09-30 MED ORDER — HEPARIN SODIUM (PORCINE) 1000 UNIT/ML IJ SOLN
INTRAMUSCULAR | Status: DC | PRN
  Administered 2015-09-30: 5500 [IU] via INTRAVENOUS

## 2015-09-30 MED ORDER — ONDANSETRON HCL 4 MG/2ML IJ SOLN: 4.0000 mg | Freq: Four times a day (QID) | INTRAMUSCULAR | Status: DC | PRN

## 2015-09-30 MED ORDER — FENTANYL CITRATE (PF) 100 MCG/2ML IJ SOLN
INTRAMUSCULAR | Status: DC | PRN
  Administered 2015-09-30 (×3): 25 ug via INTRAVENOUS

## 2015-09-30 MED ORDER — MIDAZOLAM HCL 2 MG/2ML IJ SOLN
INTRAMUSCULAR | Status: AC
  Filled 2015-09-30: qty 2

## 2015-09-30 MED ORDER — MIDAZOLAM HCL 2 MG/2ML IJ SOLN
INTRAMUSCULAR | Status: DC | PRN
  Administered 2015-09-30 (×2): 1 mg via INTRAVENOUS

## 2015-09-30 MED ORDER — HEPARIN (PORCINE) IN NACL 2-0.9 UNIT/ML-% IJ SOLN
INTRAMUSCULAR | Status: AC
  Filled 2015-09-30: qty 1000

## 2015-09-30 MED ORDER — VERAPAMIL HCL 2.5 MG/ML IV SOLN
INTRAVENOUS | Status: AC
  Filled 2015-09-30: qty 2

## 2015-09-30 MED ORDER — LIDOCAINE HCL (PF) 1 % IJ SOLN
INTRAMUSCULAR | Status: AC
  Filled 2015-09-30: qty 30

## 2015-09-30 MED ORDER — ASPIRIN 81 MG PO CHEW: 81.0000 mg | CHEWABLE_TABLET | ORAL | Status: DC

## 2015-09-30 SURGICAL SUPPLY — 10 items
CATH IMPULSE 5F ANG/FL3.5 (CATHETERS) ×1 IMPLANT
DEVICE RAD COMP TR BAND LRG (VASCULAR PRODUCTS) ×1 IMPLANT
GLIDESHEATH SLEND SS 6F .021 (SHEATH) ×1 IMPLANT
HOVERMATT SINGLE USE (MISCELLANEOUS) ×1 IMPLANT
KIT HEART LEFT (KITS) ×2 IMPLANT
PACK CARDIAC CATHETERIZATION (CUSTOM PROCEDURE TRAY) ×2 IMPLANT
TRANSDUCER W/STOPCOCK (MISCELLANEOUS) ×2 IMPLANT
TUBING CIL FLEX 10 FLL-RA (TUBING) ×2 IMPLANT
WIRE HI TORQ VERSACORE-J 145CM (WIRE) ×1 IMPLANT
WIRE SAFE-T 1.5MM-J .035X260CM (WIRE) ×1 IMPLANT

## 2015-09-30 NOTE — Discharge Instructions (Signed)
Radial Site Care °Refer to this sheet in the next few weeks. These instructions provide you with information about caring for yourself after your procedure. Your health care provider may also give you more specific instructions. Your treatment has been planned according to current medical practices, but problems sometimes occur. Call your health care provider if you have any problems or questions after your procedure. °WHAT TO EXPECT AFTER THE PROCEDURE °After your procedure, it is typical to have the following: °· Bruising at the radial site that usually fades within 1-2 weeks. °· Blood collecting in the tissue (hematoma) that may be painful to the touch. It should usually decrease in size and tenderness within 1-2 weeks. °HOME CARE INSTRUCTIONS °· Take medicines only as directed by your health care provider. °· You may shower 24-48 hours after the procedure or as directed by your health care provider. Remove the bandage (dressing) and gently wash the site with plain soap and water. Pat the area dry with a clean towel. Do not rub the site, because this may cause bleeding. °· Do not take baths, swim, or use a hot tub until your health care provider approves. °· Check your insertion site every day for redness, swelling, or drainage. °· Do not apply powder or lotion to the site. °· Do not flex or bend the affected arm for 24 hours or as directed by your health care provider. °· Do not push or pull heavy objects with the affected arm for 24 hours or as directed by your health care provider. °· Do not lift over 10 lb (4.5 kg) for 5 days after your procedure or as directed by your health care provider. °· Ask your health care provider when it is okay to: °¨ Return to work or school. °¨ Resume usual physical activities or sports. °¨ Resume sexual activity. °· Do not drive home if you are discharged the same day as the procedure. Have someone else drive you. °· You may drive 24 hours after the procedure unless otherwise  instructed by your health care provider. °· Do not operate machinery or power tools for 24 hours after the procedure. °· If your procedure was done as an outpatient procedure, which means that you went home the same day as your procedure, a responsible adult should be with you for the first 24 hours after you arrive home. °· Keep all follow-up visits as directed by your health care provider. This is important. °SEEK MEDICAL CARE IF: °· You have a fever. °· You have chills. °· You have increased bleeding from the radial site. Hold pressure on the site. CALL 911 °SEEK IMMEDIATE MEDICAL CARE IF: °· You have unusual pain at the radial site. °· You have redness, warmth, or swelling at the radial site. °· You have drainage (other than a small amount of blood on the dressing) from the radial site. °· The radial site is bleeding, and the bleeding does not stop after 30 minutes of holding steady pressure on the site. °· Your arm or hand becomes pale, cool, tingly, or numb. °  °This information is not intended to replace advice given to you by your health care provider. Make sure you discuss any questions you have with your health care provider. °  °Document Released: 02/25/2010 Document Revised: 02/13/2014 Document Reviewed: 08/11/2013 °Elsevier Interactive Patient Education ©2016 [employer] ° °

## 2015-09-30 NOTE — H&P (View-Only) (Signed)
Patient ID: Katie Higgins, female   DOB: July 24, 1950, 65 y.o.   MRN: [id number] PCP: Dr. Martinique  65 yo was admitted to [facility] in 3/11 with shortness of breath. She was found to have a CHF exacerbation. TSH was noted to be suppressed and free T4 was very high. Patient was begun on methimazole and diuresed. Echo was a difficult study but showed mild to moderately decreased EF. Myoview showed EF 38% with multiple fixed deficits. Left heart cath showed no significant coronary disease with EF 40%. It was thought that her cardiomyopathy may be the result of hyperthyroidism. Hyperthyroidism is now being treated with methimazole. Repeat echo in 9/11 showed EF 55-60%.  Last echo in 4/17 showed EF 60-65% with grade II diastolic dysfunction.    She has been in a wheelchair when outside the house for a long time due to low back, knee, and hip arthritis. She has refused total hip replacement in the past. She is able to walk short distances in her house without dyspnea with her walker. Some dyspnea with housework, but this is stable.  No chest pain. She is taking Lasix three times a week. She had been feeling weak/dizzy about a month ago and saw Truitt Merle.  Holter monitor was done, showing 30% PVCs. Her Coreg was increased to 25 mg bid, and holter was repeated => now she has a PVC burden of 36% of total beats.  She currently does not feel weakness/dizziness/palpitations.   Labs (3/11): K 4, creatinine 0.7, BNP 603=>230, free T4 3.2 (high), free T3 8.9 (high), TSH 0.008, LDL 49, HDL 45, HCT 33.5  Labs (4/11): k 3.8, creatinine 0.8, BNP 47  Labs (8/11): creatinine 1.3  Labs (8/12): K 4.8, creatinine 1.13 Labs (12/12): TSH normal Labs (4/13): LDL 59, HDL 43 Labs (10/13): K 5.5, creatinine 1.4 Labs (1/14): TSH normal Labs (4/14): LDL 60, HDL 38 Labs (1/15): TSH normal, K 4.5, creatinine 1.6 Labs (5/16): TSH normal Labs (8/16): LDL 52, HDL 53, K 5, creatinine 1.27 Labs (6/17): TSH normal Labs (7/17): K 4.8,  creatinine 1.44  ECG: NSR, normal  Allergies (verified):  1) ! * Hydrocodone/apap   Past Medical History:  1. DM: diet-controlled  2. HYPERTENSION   3. HYPERTHYROIDISM  4. CARDIOMYOPATHY: Nonischemic. Admitted in 3/11 with CHF exac, due to hyperthyroidism. LHC (3/11) showed clean coronaries with EF 40%. Myoview showed EF 38%. Echo was a technically difficult study with mild to moderately decreased EF, mild LVH, mild MR. Repeat echo (9/11) with EF 0000000, mild diastolic dysfunction (grade I).  5. Osteoarthritis - esp L hip, low back  6. Obesity  7. s/p CCY  8. CKD 9. PVCs: Holter in 7/17 with 30% PVCs and short run of SVT.  Holter in 8/17 on increased Coreg with 36% PVCs.    Family History:  No premature CAD  mom - expired age 19y - DM, breast cancer, cervical cancer  dad - expired age 5y - CVA, kidney cancer   Social History:  Lives in [place] with her son.  widowed since 1992  She is very sedentary, uses a wheelchair when out of the house.  ambulatory in home with cane  No smoking or ETOH.   ROS: All systems reviewed and negative except as per HPI.   Current Outpatient Prescriptions  Medication Sig Dispense Refill  . aspirin 81 MG tablet Take 81 mg by mouth daily.      . carvedilol (COREG) 25 MG tablet Take 1 tablet (25 mg total)  by mouth 2 (two) times daily. 180 tablet 3  . colchicine 0.6 MG tablet Take 0.6 mg by mouth daily as needed.    . cyclobenzaprine (FLEXERIL) 10 MG tablet Take 1 tablet (10 mg total) by mouth every 8 (eight) hours as needed for muscle spasms. 90 tablet 0  . furosemide (LASIX) 20 MG tablet Take 20 mg by mouth 3 (three) times a week.    . lovastatin (MEVACOR) 10 MG tablet Take 1 tablet (10 mg total) by mouth at bedtime. 90 tablet 0  . methimazole (TAPAZOLE) 5 MG tablet Take 1 tablet (5 mg total) by mouth daily. 90 tablet 2  . Multiple Vitamin (MULTIVITAMIN) tablet Take 1 tablet by mouth daily.      . ondansetron (ZOFRAN) 8 MG tablet Take 1 tablet  (8 mg total) by mouth every 8 (eight) hours as needed for nausea or vomiting. 90 tablet 3  . traMADol (ULTRAM) 50 MG tablet Take 1 tablet (50 mg total) by mouth every 6 (six) hours as needed. 180 tablet 0   No current facility-administered medications for this visit.     BP 128/76   Pulse 71   Ht 5\' 2"  (1.575 m)   Wt 271 lb (122.9 kg)   BMI 49.57 kg/m  General: NAD, morbidly obese.  Neck: No JVD, no thyromegaly or thyroid nodule.  Lungs: Clear to auscultation bilaterally with normal respiratory effort. CV: Nondisplaced PMI.  Heart regular S1/S2, no S3/S4, no murmur.  No edema.  No carotid bruit.  Normal pedal pulses.  Abdomen: Soft, nontender, no hepatosplenomegaly, no distention.   Neurologic: Alert and oriented x 3.  Psych: Normal affect. Extremities: No clubbing or cyanosis.   Assessment/Plan:  1. Morbid obesity: She is limited orthopedically, so I am not sure she is going to be able to increase her exercise level.  She has worked on changing her diet and has lost some weight though she has a ways to go. 2. Cardiomyopathy: Resolved after treatment of hyperthyroidism.  Stable exertional symptoms (mostly limited by orthopedic problems).  She looks euvolemic taking Lasix three times a week. Echo in 4/17 showed that EF remains normal. 3. Hyperlipidemia: Good lipids 8/16.  4. CKD: Creatinine has been stable.  5. PVCs: > 30% PVC burden on recent holter.  I'm not sure that she feels much different symptomatically. EF 60-65% on recent echo.  Increasing Coreg recently to 25 mg bid does not appear to have had any effect on her PVCs. TSH was normal in 6/17.  - Lexiscan Cardiolite to rule out ischemia.  - If Cardiolite is normal, she will need referral to EP.   Loralie Champagne 09/12/2015

## 2015-09-30 NOTE — Interval H&P Note (Signed)
Cath Lab Visit (complete for each Cath Lab visit)  Clinical Evaluation Leading to the Procedure:   ACS: No.  Non-ACS:    Anginal Classification: CCS III  Anti-ischemic medical therapy: Minimal Therapy (1 class of medications)  Non-Invasive Test Results: Intermediate-risk stress test findings: cardiac mortality 1-3%/year  Prior CABG: No previous CABG      History and Physical Interval Note:  09/30/2015 10:08 AM  Wray Kearns  has presented today for surgery, with the diagnosis of sob, PVC, abnormal myoview  The various methods of treatment have been discussed with the patient and family. After consideration of risks, benefits and other options for treatment, the patient has consented to  Procedure(s): Left Heart Cath and Coronary Angiography (N/A) as a surgical intervention .  The patient's history has been reviewed, patient examined, no change in status, stable for surgery.  I have reviewed the patient's chart and labs.  Questions were answered to the patient's satisfaction.     Shelbi Vaccaro [employer]

## 2015-09-30 NOTE — Telephone Encounter (Signed)
-----   Message from Candis Schatz sent at 09/30/2015 11:50 AM EDT ----- Regarding: Cardiac MRI Dr [name] wants pt to have a cardiac MRI before he sees Dr [name] on 9/13. Needs to be with contrast. DX is PVC's.   Thanks Trisha

## 2015-10-01 ENCOUNTER — Encounter: Payer: Self-pay | Admitting: Cardiology

## 2015-10-01 ENCOUNTER — Encounter: Payer: Self-pay | Admitting: Endocrinology

## 2015-10-02 ENCOUNTER — Encounter: Payer: Self-pay | Admitting: Cardiology

## 2015-10-05 ENCOUNTER — Telehealth: Payer: Self-pay | Admitting: Cardiology

## 2015-10-05 ENCOUNTER — Telehealth: Payer: Self-pay | Admitting: *Deleted

## 2015-10-05 ENCOUNTER — Encounter: Payer: Self-pay | Admitting: Internal Medicine

## 2015-10-05 MED ORDER — DIAZEPAM 2 MG PO TABS: ORAL_TABLET | ORAL | 0 refills | Status: DC

## 2015-10-05 NOTE — Telephone Encounter (Signed)
Pt advised prescription for valium 2 mg x1 to take 60 minutes prior to MRI has been sent to [facility]

## 2015-10-05 NOTE — Telephone Encounter (Signed)
Called patient and left a voicemail for her cardiac MRI scheduled for 10-12-15 at 3 p.m.

## 2015-10-05 NOTE — Telephone Encounter (Signed)
-----   Message from Larey Dresser, MD sent at 10/04/2015 10:54 PM EDT ----- Webb Silversmith, please arrange for Mrs Oppermann to get Valium 2.5 mg po x 1 to take 60 minutes prior to her MRI.

## 2015-10-08 ENCOUNTER — Encounter: Payer: Self-pay | Admitting: Internal Medicine

## 2015-10-08 ENCOUNTER — Ambulatory Visit (INDEPENDENT_AMBULATORY_CARE_PROVIDER_SITE_OTHER): Payer: Medicare Other | Admitting: Internal Medicine

## 2015-10-08 VITALS — BP 130/60 | HR 62 | Temp 97.9°F | Resp 18 | Ht 67.0 in | Wt 274.0 lb

## 2015-10-08 DIAGNOSIS — M2550 Pain in unspecified joint: Secondary | ICD-10-CM | POA: Diagnosis not present

## 2015-10-08 DIAGNOSIS — Z23 Encounter for immunization: Secondary | ICD-10-CM | POA: Diagnosis not present

## 2015-10-08 DIAGNOSIS — E118 Type 2 diabetes mellitus with unspecified complications: Secondary | ICD-10-CM | POA: Diagnosis not present

## 2015-10-08 DIAGNOSIS — M1712 Unilateral primary osteoarthritis, left knee: Secondary | ICD-10-CM

## 2015-10-08 DIAGNOSIS — I10 Essential (primary) hypertension: Secondary | ICD-10-CM

## 2015-10-08 DIAGNOSIS — M129 Arthropathy, unspecified: Secondary | ICD-10-CM | POA: Diagnosis not present

## 2015-10-08 LAB — POCT GLYCOSYLATED HEMOGLOBIN (HGB A1C): HEMOGLOBIN A1C: 5.5

## 2015-10-08 MED ORDER — TRAMADOL HCL 50 MG PO TABS: 50.0000 mg | ORAL_TABLET | Freq: Two times a day (BID) | ORAL | 0 refills | Status: DC | PRN

## 2015-10-08 MED ORDER — CYCLOBENZAPRINE HCL 10 MG PO TABS: 10.0000 mg | ORAL_TABLET | Freq: Three times a day (TID) | ORAL | 6 refills | Status: DC | PRN

## 2015-10-08 MED ORDER — MELOXICAM 15 MG PO TABS: 15.0000 mg | ORAL_TABLET | Freq: Every day | ORAL | 3 refills | Status: DC

## 2015-10-08 NOTE — Assessment & Plan Note (Addendum)
Checking fingerstick HgA1c (5.5) in office. Not on meds but has complications from this. On ACE-I. Given flu shot today and needs prevnar 13 next April and discussed this with her.

## 2015-10-08 NOTE — Assessment & Plan Note (Signed)
She is reminded that she needs to make an effort to lose some weight to help with her health by cutting portions.

## 2015-10-08 NOTE — Progress Notes (Signed)
   Subjective:    Patient ID: Katie Higgins, female    DOB: 1950/10/21, 65 y.o.   MRN: [id number]  HPI The patient is a 65 YO female coming in for follow up of her sugars (hx diabetes but has hearing loss and neuropathy from it, not taking meds currently, on ACE-I) and her fibromyalgia (takes flexeril rarely and tramadol rarely and meloxicam daily, needs refills) and her blood pressure (she takes coreg, lasix, enalapril and BP at goal, complicated by CKD stage 3). No new concerns today but she is concerned about her recent heart problems and is awaiting an MRI heart.   Review of Systems  Constitutional: Positive for activity change. Negative for appetite change, fatigue, fever and unexpected weight change.  HENT: Negative.   Eyes: Negative.   Respiratory: Negative for cough, chest tightness, shortness of breath and wheezing.   Cardiovascular: Negative for chest pain, palpitations and leg swelling.  Gastrointestinal: Negative for abdominal distention, abdominal pain, constipation, diarrhea and nausea.  Musculoskeletal: Positive for arthralgias and gait problem. Negative for back pain and myalgias.  Skin: Negative.   Neurological: Positive for numbness. Negative for dizziness, syncope, weakness and headaches.  Psychiatric/Behavioral: Negative.       Objective:   Physical Exam  Constitutional: She is oriented to person, place, and time. She appears well-developed and well-nourished.  Morbidly overweight  HENT:  Head: Normocephalic and atraumatic.  Right Ear: External ear normal.  Left Ear: External ear normal.  Eyes: EOM are normal.  Neck: Normal range of motion.  Cardiovascular: Normal rate and regular rhythm.   Pulmonary/Chest: Effort normal and breath sounds normal.  Sounds diminished with habitus  Abdominal: Soft. She exhibits no distension. There is no tenderness. There is no rebound.  Neurological: She is alert and oriented to person, place, and time. Coordination abnormal.    Uses wheelchair, able to stand for 2 minutes and to transfer.   Skin: Skin is warm and dry.  See foot exam  Psychiatric: She has a normal mood and affect.   Vitals:   10/08/15 1543  BP: 130/60  Pulse: 62  Resp: 18  Temp: 97.9 F (36.6 C)  TempSrc: Oral  SpO2: 99%  Weight: 274 lb (124.3 kg)  Height: 5\' 7"  (1.702 m)      Assessment & Plan:  Flu shot given at visit.

## 2015-10-08 NOTE — Patient Instructions (Signed)
We have checked the sugar with a fingerstick today to save you getting your blood drawn.  We have refilled the medicines and given you the flu shot.  Make sure to get the eyes checked.  Diabetes and Standards of Medical Care Diabetes is complicated. You may find that your diabetes team includes a dietitian, nurse, diabetes educator, eye doctor, and more. To help everyone know what is going on and to help you get the care you deserve, the following schedule of care was developed to help keep you on track. Below are the tests, exams, vaccines, medicines, education, and plans you will need. HbA1c test This test shows how well you have controlled your glucose over the past 2-3 months. It is used to see if your diabetes management plan needs to be adjusted.   It is performed at least 2 times a year if you are meeting treatment goals.  It is performed 4 times a year if therapy has changed or if you are not meeting treatment goals. Blood pressure test  This test is performed at every routine medical visit. The goal is less than 140/90 mm Hg for most people, but 130/80 mm Hg in some cases. Ask your health care provider about your goal. Dental exam  Follow up with the dentist regularly. Eye exam  If you are diagnosed with type 1 diabetes as a child, get an exam upon reaching the age of 37 years or older and having had diabetes for 3-5 years. Yearly eye exams are recommended after that initial eye exam.  If you are diagnosed with type 1 diabetes as an adult, get an exam within 5 years of diagnosis and then yearly.  If you are diagnosed with type 2 diabetes, get an exam as soon as possible after the diagnosis and then yearly. Foot care exam  Visual foot exams are performed at every routine medical visit. The exams check for cuts, injuries, or other problems with the feet.  You should have a complete foot exam performed every year. This exam includes an inspection of the structure and skin of your  feet, a check of the pulses in your feet, and a check of the sensation in your feet.  Type 1 diabetes: The first exam is performed 5 years after diagnosis.  Type 2 diabetes: The first exam is performed at the time of diagnosis.  Check your feet nightly for cuts, injuries, or other problems with your feet. Tell your health care provider if anything is not healing. Kidney function test (urine microalbumin)  This test is performed once a year.  Type 1 diabetes: The first test is performed 5 years after diagnosis.  Type 2 diabetes: The first test is performed at the time of diagnosis.  A serum creatinine and estimated glomerular filtration rate (eGFR) test is done once a year to assess the level of chronic kidney disease (CKD), if present. Lipid profile (cholesterol, HDL, LDL, triglycerides)  Performed every 5 years for most people.  The goal for LDL is less than 100 mg/dL. If you are at high risk, the goal is less than 70 mg/dL.  The goal for HDL is 40 mg/dL-50 mg/dL for men and 50 mg/dL-60 mg/dL for women. An HDL cholesterol of 60 mg/dL or higher gives some protection against heart disease.  The goal for triglycerides is less than 150 mg/dL. Immunizations  The flu (influenza) vaccine is recommended yearly for every person 4 months of age or older who has diabetes.  The pneumonia (pneumococcal) vaccine  is recommended for every person 63 years of age or older who has diabetes. Adults 42 years of age or older may receive the pneumonia vaccine as a series of two separate shots.  The hepatitis B vaccine is recommended for adults shortly after they have been diagnosed with diabetes.  The Tdap (tetanus, diphtheria, and pertussis) vaccine should be given:  According to normal childhood vaccination schedules, for children.  Every 10 years, for adults who have diabetes. Diabetes self-management education  Education is recommended at diagnosis and ongoing as needed. Treatment plan  Your  treatment plan is reviewed at every medical visit.   This information is not intended to replace advice given to you by your health care provider. Make sure you discuss any questions you have with your health care provider.   Document Released: 11/20/2008 Document Revised: 02/13/2014 Document Reviewed: 06/25/2012 Elsevier Interactive Patient Education [employer].

## 2015-10-08 NOTE — Assessment & Plan Note (Signed)
Refill of meloxicam and flexeril and tramadol at today's visit. She is stable at this time and uses prn meds only when needed.

## 2015-10-08 NOTE — Assessment & Plan Note (Signed)
BP at goal on her coreg, enalapril, lasix, recent CMP without indication for change.

## 2015-10-08 NOTE — Progress Notes (Signed)
Pre visit review using our clinic review tool, if applicable. No additional management support is needed unless otherwise documented below in the visit note. 

## 2015-10-12 ENCOUNTER — Encounter (HOSPITAL_COMMUNITY): Payer: Self-pay

## 2015-10-12 ENCOUNTER — Encounter: Payer: Self-pay | Admitting: Cardiology

## 2015-10-12 ENCOUNTER — Other Ambulatory Visit (HOSPITAL_COMMUNITY): Payer: Medicare Other

## 2015-10-12 ENCOUNTER — Ambulatory Visit (HOSPITAL_COMMUNITY)
Admission: RE | Admit: 2015-10-12 | Discharge: 2015-10-12 | Disposition: A | Discharge status: Home / Self Care | Payer: Medicare Other | Source: Ambulatory Visit | Attending: Cardiology | Admitting: Cardiology

## 2015-10-12 DIAGNOSIS — I493 Ventricular premature depolarization: Secondary | ICD-10-CM

## 2015-10-12 NOTE — Progress Notes (Signed)
Cardiac MRI attempted.  Pt too large to fit in the scanner and cardiac MRI is not performed in our 70cm bore scanner.  Exam cancelled.

## 2015-10-20 ENCOUNTER — Encounter: Payer: Self-pay | Admitting: Internal Medicine

## 2015-10-20 ENCOUNTER — Ambulatory Visit (INDEPENDENT_AMBULATORY_CARE_PROVIDER_SITE_OTHER): Payer: Medicare Other | Admitting: Internal Medicine

## 2015-10-20 VITALS — BP 110/50 | HR 84 | Ht 67.0 in | Wt 272.6 lb

## 2015-10-20 DIAGNOSIS — I493 Ventricular premature depolarization: Secondary | ICD-10-CM

## 2015-10-20 DIAGNOSIS — I5022 Chronic systolic (congestive) heart failure: Secondary | ICD-10-CM

## 2015-10-20 DIAGNOSIS — I10 Essential (primary) hypertension: Secondary | ICD-10-CM

## 2015-10-20 NOTE — Progress Notes (Signed)
Electrophysiology Office Note   Date:  10/20/2015   ID:  NUVIA UHR, DOB 26-Apr-1950, MRN [id number]  PCP:  Hoyt Koch, MD  Cardiologist:  Dr [name] Primary Electrophysiologist: Thompson Grayer, MD    Chief Complaint  Patient presents with  . New Patient (Initial Visit)    PVCs     History of Present Illness: Katie Higgins is a 65 y.o. female who presents today for electrophysiology evaluation.   She has asymptomatic PVCs.  She reports being in usual health until being hospitalized 4/17 with sepsis.  She says that after being discharged, her daughter (a Emergency planning/management officer) began following her vitals and checking her pulse frequently.  She was noted to have an irregular pulse.  She has been found to have very frequent PVCs. She has had an extensive evaluation by Dr [name] with echo, stress test, and cath performed.  She does not have obstructive CAD or structural heart disease.  She is tolerating beta blocker therapy. She is not very active due to DJD and sciatica.  She is very obese.  She is unaware of symptoms due to her PVCs.  Today, she denies symptoms of palpitations, chest pain, shortness of breath, orthopnea, PND, lower extremity edema, claudication, dizziness, presyncope, syncope, bleeding, or neurologic sequela. The patient is tolerating medications without difficulties and is otherwise without complaint today.    Past Medical History:  Diagnosis Date  . ALLERGIC RHINITIS   . CARDIOMYOPATHY    Nonischemic. Admitted in 3/11 with CHF exac, due to hyperthyroidism. LHC (03/11) showed clean coronaries with EF 40%. Myoview showed EF 38%. Echo was a technically difficult study with mild to moderately decreased EF, mild LVH, mild MR. Repeat echo (9/11) with EF 0000000, mild diastolic dysfunction (grade I).  . CARPAL TUNNEL SYNDROME, LEFT, MILD   . CKD (chronic kidney disease), stage III   . Congestive heart failure, unspecified   . DIABETES MELLITUS, CONTROLLED   .  DYSLIPIDEMIA   . Graves disease   . HYPERTENSION   . HYPERTHYROIDISM   . Obesity   . Osteoarthritis    esp L hip, low back  . Palpitations   . Premature ventricular contraction    Past Surgical History:  Procedure Laterality Date  . CARDIAC CATHETERIZATION  04/19/09  . CARDIAC CATHETERIZATION N/A 09/30/2015   Procedure: Left Heart Cath and Coronary Angiography;  Surgeon: Larey Dresser, MD;  Location: [facility];  Service: Cardiovascular;  Laterality: N/A;  . CHOLECYSTECTOMY    . remote hernia repair       Current Outpatient Prescriptions  Medication Sig Dispense Refill  . acetaminophen (TYLENOL) 500 MG tablet Take 500 mg by mouth every 6 (six) hours as needed for moderate pain.    [name] aspirin 81 MG tablet Take 81 mg by mouth daily.      . carvedilol (COREG) 25 MG tablet Take 1 tablet (25 mg total) by mouth 2 (two) times daily. 180 tablet 3  . colchicine 0.6 MG tablet Take 0.6 mg by mouth daily as needed. As directed    . cyclobenzaprine (FLEXERIL) 10 MG tablet Take 1 tablet (10 mg total) by mouth every 8 (eight) hours as needed for muscle spasms. 90 tablet 6  . enalapril (VASOTEC) 10 MG tablet 1 tablet by mouth in the evening 90 tablet 0  . furosemide (LASIX) 20 MG tablet Take 20 mg by mouth 3 (three) times a week.    . lovastatin (MEVACOR) 10 MG tablet Take 1 tablet (  10 mg total) by mouth at bedtime. 90 tablet 1  . meloxicam (MOBIC) 15 MG tablet Take 1 tablet (15 mg total) by mouth daily. 90 tablet 3  . methimazole (TAPAZOLE) 5 MG tablet Take 1 tablet (5 mg total) by mouth daily. 90 tablet 2  . Multiple Vitamin (MULTIVITAMIN) tablet Take 1 tablet by mouth daily.      . ondansetron (ZOFRAN) 8 MG tablet Take 1 tablet (8 mg total) by mouth every 8 (eight) hours as needed for nausea or vomiting. 90 tablet 3  . traMADol (ULTRAM) 50 MG tablet Take 50 mg by mouth every 12 (twelve) hours as needed (pain).     No current facility-administered medications for this visit.      Allergies:   Latex; Wool alcohol [lanolin]; and Amitriptyline   Social History:  The patient  reports that she has never smoked. She has never used smokeless tobacco. She reports that she does not drink alcohol or use drugs.   Family History:  The patient's  family history includes Breast cancer in her mother; Cervical cancer in her mother; Diabetes in her mother; Kidney cancer in her father; Stroke in her father; Supraventricular tachycardia in her sister.    ROS:  Please see the history of present illness.   All other systems are reviewed and negative.    PHYSICAL EXAM: VS:  BP (!) 110/50   Pulse 84   Ht 5\' 7"  (1.702 m)   Wt 272 lb 9.6 oz (123.7 kg)   BMI 42.70 kg/m  , BMI Body mass index is 42.7 kg/m. GEN: very obese in no acute distress  HEENT: normal  Neck: no JVD, carotid bruits, or masses Cardiac: RRR with ectopy  Respiratory:  clear to auscultation bilaterally, normal work of breathing GI: soft, nontender, nondistended, + BS MS: no deformity or atrophy  Skin: warm and dry  Neuro:  Strength and sensation are intact Psych: euthymic mood, full affect  EKG:  Prior ekgs reveal sinus rhythm with PVCs of a RBB inferior axis   Recent Labs: 05/20/2015: B Natriuretic Peptide 93.1 05/21/2015: ALT 36 07/14/2015: TSH 2.52 08/25/2015: Magnesium 1.9 09/27/2015: BUN 22; Creat 1.37; Hemoglobin 12.0; Platelets 303; Potassium 5.0; Sodium 141    Lipid Panel     Component Value Date/Time   CHOL 124 09/08/2014 1426   TRIG 92.0 09/08/2014 1426   TRIG 39 04/13/2009   HDL 53.50 09/08/2014 1426   CHOLHDL 2 09/08/2014 1426   VLDL 18.4 09/08/2014 1426   LDLCALC 52 09/08/2014 1426     Wt Readings from Last 3 Encounters:  10/20/15 272 lb 9.6 oz (123.7 kg)  10/08/15 274 lb (124.3 kg)  09/30/15 261 lb (118.4 kg)      Other studies Reviewed: Additional studies/ records that were reviewed today include: Dr Oleh Genin records, prior echo, stress test, cath, and holter  Review of the  above records today demonstrates: as above   ASSESSMENT AND PLAN:  1.  PVCs The patient has asymptomatic PVCs.  Though her PVC burden is robust, she has a preserved EF.  She is appropriately treated with beta blocker therapy.  The PVC likely arises from the anterseptal LV and possibly LVOT.  Given paucity of symptoms and preserved EF, I would advise conservative management.  She is very clear that she would like to avoid procedures.  Return to see EP NP in 6 months, if EF remains preserved and she remains asymptomatic then EP could see as needed going forward.  2. Obesity Body  mass index is 42.7 kg/m. She would do much better overall with weight loss.  She does not appear very interested in lifestyle change  3. HTN Stable No change required today  Follow-up:  Return to see EP NP in 6 months  Current medicines are reviewed at length with the patient today.   The patient does not have concerns regarding her medicines.  The following changes were made today:  none  Labs/ tests ordered today include:  Orders Placed This Encounter  Procedures  . ECHOCARDIOGRAM COMPLETE     Signed, Thompson Grayer, MD  10/20/2015 3:54 PM     [place] [address] [phone] (office) [phone] (fax)

## 2015-10-20 NOTE — Patient Instructions (Signed)
Medication Instructions:  .Your physician recommends that you continue on your current medications as directed. Please refer to the Current Medication list given to you today.  Labwork: None Ordered    Testing/Procedures: Your physician has requested that you have an echocardiogram day of next appointment, or a few days prior. Echocardiography is a painless test that uses sound waves to create images of your heart. It provides your doctor with information about the size and shape of your heart and how well your heart's chambers and valves are working. This procedure takes approximately one hour. There are no restrictions for this procedure.    Follow-Up: Your physician wants you to follow-up in: 6 months with Katie Marshall, NP. You will receive a reminder letter in the mail two months in advance. If you don't receive a letter, please call our office to schedule the follow-up appointment.  Any Other Special Instructions Will Be Listed Below (If Applicable).     If you need a refill on your cardiac medications before your next appointment, please call your pharmacy.

## 2015-10-27 ENCOUNTER — Encounter: Payer: Self-pay | Admitting: Cardiology

## 2015-11-10 ENCOUNTER — Ambulatory Visit: Payer: Medicare Other | Admitting: Cardiology

## 2015-11-18 ENCOUNTER — Encounter: Payer: Self-pay | Admitting: Physician Assistant

## 2015-11-18 ENCOUNTER — Ambulatory Visit (INDEPENDENT_AMBULATORY_CARE_PROVIDER_SITE_OTHER): Payer: Medicare Other | Admitting: Physician Assistant

## 2015-11-18 VITALS — BP 138/54 | HR 69 | Ht 67.0 in | Wt 278.8 lb

## 2015-11-18 DIAGNOSIS — N183 Chronic kidney disease, stage 3 unspecified: Secondary | ICD-10-CM

## 2015-11-18 DIAGNOSIS — I493 Ventricular premature depolarization: Secondary | ICD-10-CM

## 2015-11-18 DIAGNOSIS — I10 Essential (primary) hypertension: Secondary | ICD-10-CM | POA: Diagnosis not present

## 2015-11-18 DIAGNOSIS — E118 Type 2 diabetes mellitus with unspecified complications: Secondary | ICD-10-CM

## 2015-11-18 DIAGNOSIS — E785 Hyperlipidemia, unspecified: Secondary | ICD-10-CM

## 2015-11-18 DIAGNOSIS — Z8679 Personal history of other diseases of the circulatory system: Secondary | ICD-10-CM

## 2015-11-18 NOTE — Progress Notes (Signed)
Cardiology Office Note    Date:  11/18/2015   ID:  Katie Higgins, Katie Higgins Katie Higgins, Katie Higgins, MRN [id number]  PCP:  Katie Koch, MD  Cardiologist:  Dr. Aundra Higgins Primary Electrophysiologist: Katie Ly Grayer, MD      CC:  2 month follow up.   History of Present Illness:  Katie Higgins is a 65 y.o. female with a history of morbid obesity, HTN, previous NICM (now resolved), PVCs and hyperthyroidism who presents to clinic for follow up.   She was admitted to [facility] in 04/2009 with shortness of breath. She was found to have a CHF exacerbation. TSH was noted to be suppressed and free T4 was very high. Patient was begun on methimazole and diuresed. Echo was a difficult study but showed mild to moderately decreased EF. Myoview showed EF 38% with multiple fixed deficits. Left heart cath showed no significant coronary disease with EF 40%. It was thought that her cardiomyopathy may be the result of hyperthyroidism. Hyperthyroidism was treated by Dr. Loanne Higgins. Repeat echo in 9/11 showed EF 55-60%. Last echo in 4/17 showed EF 60-65% with grade II diastolic dysfunction.    She had been feeling weak and dizzy in 08/2015 and was added on to see Katie Millin NP. Holter monitor was done, showing 30% PVCs. Her Coreg was increased to 25 mg bid, and holter was repeated in 09/2015 which showed she had a PVC burden of 36% of total beats.  She saw Dr. Aundra Higgins in the office in 09/2015. He ordered a lexiscan myoview to rule out ischemia which was abnormal showed EF 50% and possible prior infarction. A subsequent LHC on 09/30/15 showed normal coronary arteries.   She saw Dr. Rayann Higgins with EP for evaluation of PVCs on 10/20/15. He felt given the paucity of her sx and normal EF, plan was to treat conservatively. She was not interested in an invasive procedures.   Today she presents to clinic for follow up. She had the stomach flu last week but now doing better. She had a little bit of chest pain last week that was on her side  that she thinks was from her fibromyalgia. No SOB. Some mild ankle edema, but no orthopnea or PND. No dizziness or syncope. No blood in stool or urine.  Doing quite well. Still no palpitations.    Past Medical History:  Diagnosis Date  . ALLERGIC RHINITIS   . CARDIOMYOPATHY    Nonischemic. Admitted in 3/11 with CHF exac, due to hyperthyroidism. LHC (03/11) showed clean coronaries with EF 40%. Myoview showed EF 38%. Echo was a technically difficult study with mild to moderately decreased EF, mild LVH, mild MR. Repeat echo (9/11) with EF 0000000, mild diastolic dysfunction (grade I).  . CARPAL TUNNEL SYNDROME, LEFT, MILD   . CKD (chronic kidney disease), stage III   . Congestive heart failure, unspecified   . DIABETES MELLITUS, CONTROLLED   . DYSLIPIDEMIA   . Graves disease   . HYPERTENSION   . HYPERTHYROIDISM   . Obesity   . Osteoarthritis    esp L hip, low back  . Palpitations   . Premature ventricular contraction     Past Surgical History:  Procedure Laterality Date  . CARDIAC CATHETERIZATION  04/19/09  . CARDIAC CATHETERIZATION N/A 09/30/2015   Procedure: Left Heart Cath and Coronary Angiography;  Surgeon: Larey Dresser, MD;  Location: [facility];  Service: Cardiovascular;  Laterality: N/A;  . CHOLECYSTECTOMY    . remote hernia repair  Current Medications: Outpatient Medications Prior to Visit  Medication Sig Dispense Refill  . acetaminophen (TYLENOL) 500 MG tablet Take 500 mg by mouth every 6 (six) hours as needed for moderate pain.    [name] aspirin 81 MG tablet Take 81 mg by mouth daily.      . carvedilol (COREG) 25 MG tablet Take 1 tablet (25 mg total) by mouth 2 (two) times daily. 180 tablet 3  . colchicine 0.6 MG tablet Take 0.6 mg by mouth daily as needed. As directed    . cyclobenzaprine (FLEXERIL) 10 MG tablet Take 1 tablet (10 mg total) by mouth every 8 (eight) hours as needed for muscle spasms. 90 tablet 6  . enalapril (VASOTEC) 10 MG tablet 1 tablet by  mouth in the evening 90 tablet 0  . furosemide (LASIX) 20 MG tablet Take 20 mg by mouth 3 (three) times a week.    . lovastatin (MEVACOR) 10 MG tablet Take 1 tablet (10 mg total) by mouth at bedtime. 90 tablet 1  . meloxicam (MOBIC) 15 MG tablet Take 1 tablet (15 mg total) by mouth daily. 90 tablet 3  . methimazole (TAPAZOLE) 5 MG tablet Take 1 tablet (5 mg total) by mouth daily. 90 tablet 2  . Multiple Vitamin (MULTIVITAMIN) tablet Take 1 tablet by mouth daily.      . ondansetron (ZOFRAN) 8 MG tablet Take 1 tablet (8 mg total) by mouth every 8 (eight) hours as needed for nausea or vomiting. 90 tablet 3  . traMADol (ULTRAM) 50 MG tablet Take 50 mg by mouth every 12 (twelve) hours as needed (pain).     No facility-administered medications prior to visit.      Allergies:   Latex; Wool alcohol [lanolin]; and Amitriptyline   Social History   Social History  . Marital status: Widowed    Spouse name: N/A  . Number of children: N/A  . Years of education: N/A   Social History Main Topics  . Smoking status: Never Smoker  . Smokeless tobacco: Never Used  . Alcohol use No  . Drug use: No  . Sexual activity: Not Asked   Other Topics Concern  . None   Social History Narrative   Lives in [place] with her son.   Widowed since 1992.   She is very sedentary, uses a wheelchair when out of the house.   Ambulatory in home with cane.     Family History:  The patient's family history includes Breast cancer in her mother; Cervical cancer in her mother; Diabetes in her mother; Kidney cancer in her father; Stroke in her father; Supraventricular tachycardia in her sister.     ROS:   Please see the history of present illness.    ROS All other systems reviewed and are negative.   PHYSICAL EXAM:   VS:  BP (!) 138/54   Pulse 69   Ht 5\' 7"  (1.702 m)   Wt 278 lb 12.8 oz (126.5 kg)   BMI 43.67 kg/m    GEN: Well nourished, well developed, in no acute distress, morbidly obese HEENT: normal    Neck: no JVD, carotid bruits, or masses Cardiac: RRR; no murmurs, rubs, or gallops,no edema  Respiratory:  clear to auscultation bilaterally, normal work of breathing GI: soft, nontender, nondistended, + BS MS: no deformity or atrophy  Skin: warm and dry, no rash Neuro:  Alert and Oriented x 3, Strength and sensation are intact Psych: euthymic mood, full affect  Wt Readings from Last 3 Encounters:  11/18/15 278 lb 12.8 oz (126.5 kg)  10/20/15 272 lb 9.6 oz (123.7 kg)  10/08/15 274 lb (124.3 kg)      Studies/Labs Reviewed:   EKG:  EKG is ordered today.  The ekg ordered today demonstrates NSR with low voltage QRS HR 69  Recent Labs: 05/20/2015: B Natriuretic Peptide 93.1 05/21/2015: ALT 36 07/14/2015: TSH 2.52 08/25/2015: Magnesium 1.9 09/27/2015: BUN 22; Creat 1.37; Hemoglobin 12.0; Platelets 303; Potassium 5.0; Sodium 141   Lipid Panel    Component Value Date/Time   CHOL 124 09/08/2014 1426   TRIG 92.0 09/08/2014 1426   TRIG 39 04/13/2009   HDL 53.50 09/08/2014 1426   CHOLHDL 2 09/08/2014 1426   VLDL 18.4 09/08/2014 1426   LDLCALC 52 09/08/2014 1426    Additional studies/ records that were reviewed today include:   2D ECHO: 05/21/2015 LV EF: 60% -   65% Study Conclusions - Left ventricle: The cavity size was normal. Wall thickness was   increased in a pattern of mild LVH. Systolic function was normal.   The estimated ejection fraction was in the range of 60% to 65%.   Wall motion was normal; there were no regional wall motion   abnormalities. Features are consistent with a pseudonormal left   ventricular filling pattern, with concomitant abnormal relaxation   and increased filling pressure (grade 2 diastolic dysfunction). - Mitral valve: Mildly calcified annulus. - Right atrium: The atrium was mildly dilated.  Holter monitor: 08/2015 Study Highlights  1. Very frequent PVCs (30% total beats).  2. Short run SVT HR 140s.     Study Highlights 09/15/15  Nuclear  stress EF: 50%.  There was no ST segment deviation noted during stress.  Defect 1: There is a small defect of mild severity present in the basal inferior and mid inferior location. This is better at stress than rest and appears artifactual.  Defect 2: There is a small defect of moderate severity present in the mid anteroseptal and apical septal location. This is worse at stress than rest and may represent breast attenuation artifact. However, the wall motion is also abnormal in this region, suggesting that there may be prior infarct or scar.  Findings consistent with prior myocardial infarction.  This is a low risk study.  The left ventricular ejection fraction is mildly decreased (45-54%).  No inducible ischemia.    Left Heart Cath and Coronary Angiography 09/30/15 No obstructive coronary disease.  Small caliber distal LAD but similar to prior study.       ASSESSMENT & PLAN:   Morbid obesity: Body mass index is 43.67 kg/m. She is limited orthopedically, limiting exercise. Counseled on better eating habits  Cardiomyopathy: Resolved after treatment of hyperthyroidism.  Stable exertional symptoms (mostly limited by orthopedic problems and obesity).  She looks euvolemic taking Lasix three times a week. Echo in 4/17 showed that EF remains normal.  HLD: Good lipids 8/16.   CKD: Creatinine has been stable around 1.4.   PVCs: > 30% PVC burden on holter in 08/2015.  EF 60-65% on recent echo. Increasing Coreg to 25 mg bid did not appear to have had any effect on her PVCs (PVC burden still >30% on repeat holter in 09/2015). TSH was normal in 6/17.  - recent cath with no CAD - seen by EP who felt given the paucity of her sx and normal EF, plan was to treat conservatively. She was not interested in invasive procedures.   DMT2: recent HgA1c excellent at 5.5  Medication Adjustments/Labs and  Tests Ordered: Current medicines are reviewed at length with the patient today.  Concerns regarding  medicines are outlined above.  Medication changes, Labs and Tests ordered today are listed in the Patient Instructions below. Patient Instructions  Medication Instructions:  Your physician recommends that you continue on your current medications as directed. Please refer to the Current Medication list given to you today.   Labwork: None ordered  Testing/Procedures: None ordered  Follow-Up: Your physician wants you to follow-up in: 6 MONTHS   You will receive a reminder letter in the mail two months in advance. If you don't receive a letter, please call our office to schedule the follow-up appointment.   Any Other Special Instructions Will Be Listed Below (If Applicable).     If you need a refill on your cardiac medications before your next appointment, please call your pharmacy.      Signed, Angelena Form, PA-C  11/18/2015 3:06 PM    [facility] [address], [place], [place]  [address] Phone: [phone]; Fax: [phone]

## 2015-11-18 NOTE — Patient Instructions (Signed)
Medication Instructions:  Your physician recommends that you continue on your current medications as directed. Please refer to the Current Medication list given to you today.   Labwork: None ordered  Testing/Procedures: None ordered  Follow-Up: Your physician wants you to follow-up in: 6 MONTHS   You will receive a reminder letter in the mail two months in advance. If you don't receive a letter, please call our office to schedule the follow-up appointment.   Any Other Special Instructions Will Be Listed Below (If Applicable).     If you need a refill on your cardiac medications before your next appointment, please call your pharmacy.

## 2015-11-22 ENCOUNTER — Encounter: Payer: Self-pay | Admitting: Internal Medicine

## 2015-11-24 ENCOUNTER — Ambulatory Visit: Payer: Self-pay | Admitting: Family Medicine

## 2015-11-24 ENCOUNTER — Encounter: Payer: Self-pay | Admitting: Internal Medicine

## 2015-11-25 ENCOUNTER — Encounter: Payer: Self-pay | Admitting: Geriatric Medicine

## 2015-11-29 ENCOUNTER — Encounter: Payer: Self-pay | Admitting: Internal Medicine

## 2015-11-30 MED ORDER — TRAMADOL HCL 50 MG PO TABS: 50.0000 mg | ORAL_TABLET | Freq: Two times a day (BID) | ORAL | 0 refills | Status: DC | PRN

## 2015-12-25 ENCOUNTER — Encounter: Payer: Self-pay | Admitting: Cardiology

## 2015-12-25 DIAGNOSIS — R0602 Shortness of breath: Secondary | ICD-10-CM

## 2015-12-25 DIAGNOSIS — I5032 Chronic diastolic (congestive) heart failure: Secondary | ICD-10-CM

## 2015-12-27 MED ORDER — LOVASTATIN 10 MG PO TABS: 10.0000 mg | ORAL_TABLET | Freq: Every day | ORAL | 1 refills | Status: DC

## 2015-12-28 ENCOUNTER — Encounter: Payer: Self-pay | Admitting: Internal Medicine

## 2015-12-29 ENCOUNTER — Other Ambulatory Visit: Payer: Self-pay | Admitting: Geriatric Medicine

## 2015-12-29 MED ORDER — GLUCOSE BLOOD VI STRP: ORAL_STRIP | 3 refills | Status: DC

## 2016-01-04 ENCOUNTER — Encounter: Payer: Self-pay | Admitting: Cardiology

## 2016-01-06 ENCOUNTER — Other Ambulatory Visit: Payer: Self-pay | Admitting: *Deleted

## 2016-01-06 DIAGNOSIS — I5032 Chronic diastolic (congestive) heart failure: Secondary | ICD-10-CM

## 2016-01-06 DIAGNOSIS — I493 Ventricular premature depolarization: Secondary | ICD-10-CM

## 2016-01-06 MED ORDER — ENALAPRIL MALEATE 10 MG PO TABS: 10.0000 mg | ORAL_TABLET | Freq: Every evening | ORAL | 3 refills | Status: DC

## 2016-01-10 ENCOUNTER — Encounter: Payer: Self-pay | Admitting: Cardiology

## 2016-01-10 ENCOUNTER — Encounter: Payer: Self-pay | Admitting: Internal Medicine

## 2016-01-10 ENCOUNTER — Other Ambulatory Visit: Payer: Self-pay | Admitting: Geriatric Medicine

## 2016-01-10 MED ORDER — TRAMADOL HCL 50 MG PO TABS: 50.0000 mg | ORAL_TABLET | Freq: Two times a day (BID) | ORAL | 3 refills | Status: DC | PRN

## 2016-01-10 MED ORDER — MELOXICAM 15 MG PO TABS: 15.0000 mg | ORAL_TABLET | Freq: Every day | ORAL | 3 refills | Status: DC

## 2016-01-14 ENCOUNTER — Ambulatory Visit: Payer: Medicare Other | Admitting: Endocrinology

## 2016-01-20 ENCOUNTER — Ambulatory Visit (INDEPENDENT_AMBULATORY_CARE_PROVIDER_SITE_OTHER): Payer: Medicare Other | Admitting: Endocrinology

## 2016-01-20 ENCOUNTER — Encounter: Payer: Self-pay | Admitting: Endocrinology

## 2016-01-20 VITALS — BP 128/80 | HR 75 | Ht 67.0 in | Wt 272.0 lb

## 2016-01-20 DIAGNOSIS — E0580 Other thyrotoxicosis without thyrotoxic crisis or storm: Secondary | ICD-10-CM

## 2016-01-20 LAB — TSH: TSH: 3.91 u[IU]/mL (ref 0.35–4.50)

## 2016-01-20 LAB — T4, FREE: Free T4: 0.89 ng/dL (ref 0.60–1.60)

## 2016-01-20 NOTE — Progress Notes (Signed)
Subjective:    Patient ID: Katie Higgins, female    DOB: 1950-04-22, 65 y.o.   MRN: [id number]  HPI Pt returns for f/u of hyperthyroidism (due to [place] dz; dx'ed 2010; US showed heterogeneity of texture and one small nodule; she has been unable to safely d/c tapazole for RAI rx, due to CHF).  pt states she feels well in general.  She takes tapazole as rx'ed.   Past Medical History:  Diagnosis Date  . ALLERGIC RHINITIS   . CARDIOMYOPATHY    Nonischemic. Admitted in 3/11 with CHF exac, due to hyperthyroidism. LHC (03/11) showed clean coronaries with EF 40%. Myoview showed EF 38%. Echo was a technically difficult study with mild to moderately decreased EF, mild LVH, mild MR. Repeat echo (9/11) with EF 0000000, mild diastolic dysfunction (grade I).  . CARPAL TUNNEL SYNDROME, LEFT, MILD   . CKD (chronic kidney disease), stage III   . Congestive heart failure, unspecified   . DIABETES MELLITUS, CONTROLLED   . DYSLIPIDEMIA   . Graves disease   . HYPERTENSION   . HYPERTHYROIDISM   . Obesity   . Osteoarthritis    esp L hip, low back  . Palpitations   . Premature ventricular contraction     Past Surgical History:  Procedure Laterality Date  . CARDIAC CATHETERIZATION  04/19/09  . CARDIAC CATHETERIZATION N/A 09/30/2015   Procedure: Left Heart Cath and Coronary Angiography;  Surgeon: Larey Dresser, MD;  Location: [facility];  Service: Cardiovascular;  Laterality: N/A;  . CHOLECYSTECTOMY    . remote hernia repair      Social History   Social History  . Marital status: Widowed    Spouse name: N/A  . Number of children: N/A  . Years of education: N/A   Occupational History  . Not on file.   Social History Main Topics  . Smoking status: Never Smoker  . Smokeless tobacco: Never Used  . Alcohol use No  . Drug use: No  . Sexual activity: Not on file   Other Topics Concern  . Not on file   Social History Narrative   Lives in [place] with her son.   Widowed since  1992.   She is very sedentary, uses a wheelchair when out of the house.   Ambulatory in home with cane.    Current Outpatient Prescriptions on File Prior to Visit  Medication Sig Dispense Refill  . acetaminophen (TYLENOL) 500 MG tablet Take 500 mg by mouth every 6 (six) hours as needed for moderate pain.    [name] aspirin 81 MG tablet Take 81 mg by mouth daily.      . carvedilol (COREG) 25 MG tablet Take 1 tablet (25 mg total) by mouth 2 (two) times daily. 180 tablet 3  . colchicine 0.6 MG tablet Take 0.6 mg by mouth daily as needed. As directed    . cyclobenzaprine (FLEXERIL) 10 MG tablet Take 1 tablet (10 mg total) by mouth every 8 (eight) hours as needed for muscle spasms. 90 tablet 6  . enalapril (VASOTEC) 10 MG tablet Take 1 tablet (10 mg total) by mouth every evening. 90 tablet 3  . furosemide (LASIX) 20 MG tablet Take 20 mg by mouth 3 (three) times a week.    [name] glucose blood test strip Use as instructed to check blood sugar twice daily. E11.9 50 each 3  . lovastatin (MEVACOR) 10 MG tablet Take 1 tablet (10 mg total) by mouth at bedtime. 90 tablet 1  .  meloxicam (MOBIC) 15 MG tablet Take 1 tablet (15 mg total) by mouth daily. 90 tablet 3  . methimazole (TAPAZOLE) 5 MG tablet Take 1 tablet (5 mg total) by mouth daily. 90 tablet 2  . Multiple Vitamin (MULTIVITAMIN) tablet Take 1 tablet by mouth daily.      . ondansetron (ZOFRAN) 8 MG tablet Take 1 tablet (8 mg total) by mouth every 8 (eight) hours as needed for nausea or vomiting. 90 tablet 3  . traMADol (ULTRAM) 50 MG tablet Take 1 tablet (50 mg total) by mouth every 12 (twelve) hours as needed (pain). 60 tablet 3   No current facility-administered medications on file prior to visit.     Allergies  Allergen Reactions  . Latex Rash  . Wool Alcohol [Lanolin] Rash  . Amitriptyline Other (See Comments)    Leg cramps, trimmers     Family History  Problem Relation Age of Onset  . Diabetes Mother   . Breast cancer Mother   . Cervical  cancer Mother   . Kidney cancer Father     Kidney Cancer  . Stroke Father     CVA  . Supraventricular tachycardia Sister   . Asthma      family history    BP 128/80   Pulse 75   Ht 5\' 7"  (1.702 m)   Wt 272 lb (123.4 kg)   SpO2 97%   BMI 42.60 kg/m    Review of Systems Denies fever    Objective:   Physical Exam VITAL SIGNS:  See vs page GENERAL: no distress.  In wheelchair.   NECK: There is no palpable thyroid enlargement.  No thyroid nodule is palpable.  No palpable lymphadenopathy at the anterior neck.   Lab Results  Component Value Date   TSH 3.91 01/20/2016      Assessment & Plan:  Hyperthyroidism: well-controlled. Please continue the same medication Please come back for a follow-up appointment in 6 months.

## 2016-01-20 NOTE — Patient Instructions (Signed)
blood tests are requested for you today.  We'll let you know about the results.  Please come back for a follow-up appointment in 6 months.  if ever you have fever while taking methimazole, stop it and call us, because of the risk of a rare side-effect.    

## 2016-03-11 ENCOUNTER — Encounter: Payer: Self-pay | Admitting: Cardiology

## 2016-05-09 NOTE — Progress Notes (Signed)
Electrophysiology Office Note Date: 05/11/2016  ID:  Lilleigh, Hechavarria 02-Nov-1950, MRN [id number]  PCP: Hoyt Koch, MD Primary Cardiologist: [name] Electrophysiologist: Allred  CC: PVC follow up  Katie Higgins is a 66 y.o. female seen today for Dr [name].  She presents today for routine electrophysiology followup.  Since last being seen in our clinic, the patient reports doing very well. She has not had any symptoms from PVC's. She is predominately wheelchair bound. She denies chest pain, palpitations, dyspnea, PND, orthopnea, nausea, vomiting, dizziness, syncope, edema, weight gain, or early satiety.  Past Medical History:  Diagnosis Date  . ALLERGIC RHINITIS   . CARDIOMYOPATHY    Nonischemic. Admitted in 3/11 with CHF exac, due to hyperthyroidism. LHC (03/11) showed clean coronaries with EF 40%. Myoview showed EF 38%. Echo was a technically difficult study with mild to moderately decreased EF, mild LVH, mild MR. Repeat echo (9/11) with EF 57-32%, mild diastolic dysfunction (grade I).  . CARPAL TUNNEL SYNDROME, LEFT, MILD   . CKD (chronic kidney disease), stage III   . Congestive heart failure, unspecified   . DIABETES MELLITUS, CONTROLLED   . DYSLIPIDEMIA   . Graves disease   . HYPERTENSION   . HYPERTHYROIDISM   . Obesity   . Osteoarthritis    esp L hip, low back  . Palpitations   . Premature ventricular contraction    Past Surgical History:  Procedure Laterality Date  . CARDIAC CATHETERIZATION  04/19/09  . CARDIAC CATHETERIZATION N/A 09/30/2015   Procedure: Left Heart Cath and Coronary Angiography;  Surgeon: Larey Dresser, MD;  Location: [facility];  Service: Cardiovascular;  Laterality: N/A;  . CHOLECYSTECTOMY    . remote hernia repair      Current Outpatient Prescriptions  Medication Sig Dispense Refill  . acetaminophen (TYLENOL) 500 MG tablet Take 500 mg by mouth every 6 (six) hours as needed for moderate pain.    [name] aspirin 81 MG tablet  Take 81 mg by mouth daily.      . carvedilol (COREG) 25 MG tablet Take 1 tablet (25 mg total) by mouth 2 (two) times daily. 180 tablet 3  . colchicine 0.6 MG tablet Take 0.6 mg by mouth daily as needed. As directed    . cyclobenzaprine (FLEXERIL) 10 MG tablet Take 1 tablet (10 mg total) by mouth every 8 (eight) hours as needed for muscle spasms. 90 tablet 6  . enalapril (VASOTEC) 10 MG tablet Take 1 tablet (10 mg total) by mouth every evening. 90 tablet 3  . furosemide (LASIX) 20 MG tablet Take 20 mg by mouth 3 (three) times a week.    [name] glucose blood test strip Use as instructed to check blood sugar twice daily. E11.9 50 each 3  . lovastatin (MEVACOR) 10 MG tablet Take 1 tablet (10 mg total) by mouth at bedtime. 90 tablet 1  . meloxicam (MOBIC) 15 MG tablet Take 1 tablet (15 mg total) by mouth daily. 90 tablet 3  . methimazole (TAPAZOLE) 5 MG tablet Take 1 tablet (5 mg total) by mouth daily. 90 tablet 2  . Multiple Vitamin (MULTIVITAMIN) tablet Take 1 tablet by mouth daily.      . ondansetron (ZOFRAN) 8 MG tablet Take 1 tablet (8 mg total) by mouth every 8 (eight) hours as needed for nausea or vomiting. 90 tablet 3  . traMADol (ULTRAM) 50 MG tablet Take 1 tablet (50 mg total) by mouth every 12 (twelve) hours as needed (pain).  60 tablet 3   No current facility-administered medications for this visit.     Allergies:   Latex; Wool alcohol [lanolin]; and Amitriptyline   Social History: Social History   Social History  . Marital status: Widowed    Spouse name: N/A  . Number of children: N/A  . Years of education: N/A   Occupational History  . Not on file.   Social History Main Topics  . Smoking status: Never Smoker  . Smokeless tobacco: Never Used  . Alcohol use No  . Drug use: No  . Sexual activity: Not on file   Other Topics Concern  . Not on file   Social History Narrative   Lives in [place] with her son.   Widowed since 1992.   She is very sedentary, uses a wheelchair  when out of the house.   Ambulatory in home with cane.    Family History: Family History  Problem Relation Age of Onset  . Diabetes Mother   . Breast cancer Mother   . Cervical cancer Mother   . Kidney cancer Father     Kidney Cancer  . Stroke Father     CVA  . Supraventricular tachycardia Sister   . Asthma      family history    Review of Systems: All other systems reviewed and are otherwise negative except as noted above.   Physical Exam: VS:  BP 110/62   Pulse 70   Ht 5\' 7"  (1.702 m)   Wt 265 lb (120.2 kg)   BMI 41.50 kg/m  , BMI Body mass index is 41.5 kg/m. Wt Readings from Last 3 Encounters:  05/11/16 265 lb (120.2 kg)  01/20/16 272 lb (123.4 kg)  11/18/15 278 lb 12.8 oz (126.5 kg)    GEN- The patient is morbidly obese appearing, alert and oriented x 3 today, in a wheelchair.   HEENT: normocephalic, atraumatic; sclera clear, conjunctiva pink; hearing intact; oropharynx clear; neck supple  Lungs- Clear to ausculation bilaterally, normal work of breathing.  No wheezes, rales, rhonchi Heart- Regular rate and rhythm  GI- soft, non-tender, non-distended, bowel sounds present  Extremities- no clubbing, cyanosis, or edema  MS- no significant deformity or atrophy Skin- warm and dry, no rash or lesion  Psych- euthymic mood, full affect Neuro- strength and sensation are intact   EKG:  EKG is ordered today. The ekg ordered today shows sinus rhythm, rate 70  Recent Labs: 05/20/2015: B Natriuretic Peptide 93.1 05/21/2015: ALT 36 08/25/2015: Magnesium 1.9 09/27/2015: BUN 22; Creat 1.37; Hemoglobin 12.0; Platelets 303; Potassium 5.0; Sodium 141 01/20/2016: TSH 3.91    Other studies Reviewed: Additional studies/ records that were reviewed today include: Dr Jackalyn Lombard office notes  Assessment and Plan: 1.  PVC's Remains asymptomatic Echo updated today If EF preserved, will continue with BB therapy and no other interventions  2.  Morbid obesity Body mass index is  41.5 kg/m. Weight loss encouraged  3.  HTN Stable No change required today    Current medicines are reviewed at length with the patient today.   The patient does not have concerns regarding her medicines.  The following changes were made today:  none  Labs/ tests ordered today include:  Orders Placed This Encounter  Procedures  . EKG 12-Lead     Disposition:   Follow up with me in 1 year (Dr [name] no longer seeing patients at [facility]) as long as echo stable    Signed, Chanetta Marshall, NP 05/11/2016 12:27 PM  [place] [id number] [phone] (office) [phone] (fax)

## 2016-05-11 ENCOUNTER — Ambulatory Visit (INDEPENDENT_AMBULATORY_CARE_PROVIDER_SITE_OTHER): Payer: Medicare Other | Admitting: Nurse Practitioner

## 2016-05-11 ENCOUNTER — Ambulatory Visit (HOSPITAL_COMMUNITY): Payer: Medicare Other | Attending: Cardiovascular Disease

## 2016-05-11 ENCOUNTER — Other Ambulatory Visit: Payer: Self-pay

## 2016-05-11 VITALS — BP 110/62 | HR 70 | Ht 67.0 in | Wt 265.0 lb

## 2016-05-11 DIAGNOSIS — I493 Ventricular premature depolarization: Secondary | ICD-10-CM | POA: Diagnosis not present

## 2016-05-11 DIAGNOSIS — I509 Heart failure, unspecified: Secondary | ICD-10-CM

## 2016-05-11 DIAGNOSIS — E785 Hyperlipidemia, unspecified: Secondary | ICD-10-CM | POA: Diagnosis not present

## 2016-05-11 DIAGNOSIS — E1122 Type 2 diabetes mellitus with diabetic chronic kidney disease: Secondary | ICD-10-CM | POA: Insufficient documentation

## 2016-05-11 DIAGNOSIS — I10 Essential (primary) hypertension: Secondary | ICD-10-CM

## 2016-05-11 DIAGNOSIS — I5022 Chronic systolic (congestive) heart failure: Secondary | ICD-10-CM | POA: Diagnosis not present

## 2016-05-11 DIAGNOSIS — I429 Cardiomyopathy, unspecified: Secondary | ICD-10-CM | POA: Diagnosis not present

## 2016-05-11 DIAGNOSIS — N189 Chronic kidney disease, unspecified: Secondary | ICD-10-CM | POA: Insufficient documentation

## 2016-05-11 NOTE — Patient Instructions (Signed)
Medication Instructions:    Your physician recommends that you continue on your current medications as directed. Please refer to the Current Medication list given to you today.   If you need a refill on your cardiac medications before your next appointment, please call your pharmacy.  Labwork: NONE ORDERED  TODAY    Testing/Procedures: NONE ORDERED  TODAY   Follow-Up: Your physician wants you to follow-up in: [place] will receive a reminder letter in the mail two months in advance. If you don't receive a letter, please call our office to schedule the follow-up appointment.     Any Other Special Instructions Will Be Listed Below (If Applicable).

## 2016-05-19 ENCOUNTER — Other Ambulatory Visit: Payer: Self-pay | Admitting: *Deleted

## 2016-05-19 ENCOUNTER — Encounter: Payer: Self-pay | Admitting: Cardiology

## 2016-05-19 ENCOUNTER — Encounter: Payer: Self-pay | Admitting: Endocrinology

## 2016-05-19 ENCOUNTER — Other Ambulatory Visit: Payer: Self-pay

## 2016-05-19 DIAGNOSIS — I5032 Chronic diastolic (congestive) heart failure: Secondary | ICD-10-CM

## 2016-05-19 DIAGNOSIS — R0602 Shortness of breath: Secondary | ICD-10-CM

## 2016-05-19 MED ORDER — LOVASTATIN 10 MG PO TABS: 10.0000 mg | ORAL_TABLET | Freq: Every day | ORAL | 3 refills | Status: DC

## 2016-05-23 ENCOUNTER — Other Ambulatory Visit: Payer: Self-pay

## 2016-05-24 MED ORDER — METHIMAZOLE 5 MG PO TABS: 5.0000 mg | ORAL_TABLET | Freq: Every day | ORAL | 2 refills | Status: DC

## 2016-05-26 ENCOUNTER — Encounter: Payer: Self-pay | Admitting: Internal Medicine

## 2016-05-26 MED ORDER — GLUCOSE BLOOD VI STRP: ORAL_STRIP | 1 refills | Status: DC

## 2016-06-02 ENCOUNTER — Encounter: Payer: Self-pay | Admitting: Cardiology

## 2016-06-02 ENCOUNTER — Encounter: Payer: Self-pay | Admitting: Endocrinology

## 2016-06-10 ENCOUNTER — Encounter: Payer: Self-pay | Admitting: Internal Medicine

## 2016-06-12 MED ORDER — TRAMADOL HCL 50 MG PO TABS: 50.0000 mg | ORAL_TABLET | Freq: Two times a day (BID) | ORAL | 2 refills | Status: DC | PRN

## 2016-06-12 NOTE — Telephone Encounter (Signed)
Faxed script to mails-by-mail...[name]

## 2016-07-31 ENCOUNTER — Ambulatory Visit (INDEPENDENT_AMBULATORY_CARE_PROVIDER_SITE_OTHER): Payer: Medicare Other | Admitting: Endocrinology

## 2016-07-31 ENCOUNTER — Encounter: Payer: Self-pay | Admitting: Endocrinology

## 2016-07-31 VITALS — BP 126/70 | HR 82 | Ht 67.0 in | Wt 284.0 lb

## 2016-07-31 DIAGNOSIS — E0580 Other thyrotoxicosis without thyrotoxic crisis or storm: Secondary | ICD-10-CM | POA: Diagnosis not present

## 2016-07-31 LAB — T4, FREE: Free T4: 0.78 ng/dL (ref 0.60–1.60)

## 2016-07-31 LAB — TSH: TSH: 4.92 u[IU]/mL — ABNORMAL HIGH (ref 0.35–4.50)

## 2016-07-31 MED ORDER — METHIMAZOLE 5 MG PO TABS: 5.0000 mg | ORAL_TABLET | ORAL | 2 refills | Status: DC

## 2016-07-31 NOTE — Progress Notes (Signed)
Subjective:    Patient ID: Katie Higgins, female    DOB: [birth year], 66 y.o.   MRN: [id number]  HPI Pt returns for f/u of hyperthyroidism (due to [place] dz; dx'ed 2010; US showed heterogeneity of texture and one small nodule; she has been unable to safely d/c tapazole for RAI rx, due to CHF).  pt states she feels well in general.  She takes tapazole as rx'ed.   Past Medical History:  Diagnosis Date  . ALLERGIC RHINITIS   . CARDIOMYOPATHY    Nonischemic. Admitted in 3/11 with CHF exac, due to hyperthyroidism. LHC (03/11) showed clean coronaries with EF 40%. Myoview showed EF 38%. Echo was a technically difficult study with mild to moderately decreased EF, mild LVH, mild MR. Repeat echo (9/11) with EF 62-69%, mild diastolic dysfunction (grade I).  . CARPAL TUNNEL SYNDROME, LEFT, MILD   . CKD (chronic kidney disease), stage III   . Congestive heart failure, unspecified   . DIABETES MELLITUS, CONTROLLED   . DYSLIPIDEMIA   . Graves disease   . HYPERTENSION   . HYPERTHYROIDISM   . Obesity   . Osteoarthritis    esp L hip, low back  . Palpitations   . Premature ventricular contraction     Past Surgical History:  Procedure Laterality Date  . CARDIAC CATHETERIZATION  04/19/09  . CARDIAC CATHETERIZATION N/A 09/30/2015   Procedure: Left Heart Cath and Coronary Angiography;  Surgeon: Larey Dresser, MD;  Location: [facility];  Service: Cardiovascular;  Laterality: N/A;  . CHOLECYSTECTOMY    . remote hernia repair      Social History   Social History  . Marital status: Widowed    Spouse name: N/A  . Number of children: N/A  . Years of education: N/A   Occupational History  . Not on file.   Social History Main Topics  . Smoking status: Never Smoker  . Smokeless tobacco: Never Used  . Alcohol use No  . Drug use: No  . Sexual activity: Not on file   Other Topics Concern  . Not on file   Social History Narrative   Lives in [place] with her son.   Widowed since  1992.   She is very sedentary, uses a wheelchair when out of the house.   Ambulatory in home with cane.    Current Outpatient Prescriptions on File Prior to Visit  Medication Sig Dispense Refill  . acetaminophen (TYLENOL) 500 MG tablet Take 500 mg by mouth every 6 (six) hours as needed for moderate pain.    [name] aspirin 81 MG tablet Take 81 mg by mouth daily.      . carvedilol (COREG) 25 MG tablet Take 1 tablet (25 mg total) by mouth 2 (two) times daily. 180 tablet 3  . colchicine 0.6 MG tablet Take 0.6 mg by mouth daily as needed. As directed    . cyclobenzaprine (FLEXERIL) 10 MG tablet Take 1 tablet (10 mg total) by mouth every 8 (eight) hours as needed for muscle spasms. 90 tablet 6  . enalapril (VASOTEC) 10 MG tablet Take 1 tablet (10 mg total) by mouth every evening. 90 tablet 3  . furosemide (LASIX) 20 MG tablet Take 20 mg by mouth 3 (three) times a week.    [name] glucose blood (FREESTYLE TEST STRIPS) test strip Use to check blood sugars twice a day 300 each 1  . lovastatin (MEVACOR) 10 MG tablet Take 1 tablet (10 mg total) by mouth at bedtime. 90 tablet  3  . meloxicam (MOBIC) 15 MG tablet Take 1 tablet (15 mg total) by mouth daily. 90 tablet 3  . Multiple Vitamin (MULTIVITAMIN) tablet Take 1 tablet by mouth daily.      . ondansetron (ZOFRAN) 8 MG tablet Take 1 tablet (8 mg total) by mouth every 8 (eight) hours as needed for nausea or vomiting. 90 tablet 3  . traMADol (ULTRAM) 50 MG tablet Take 1 tablet (50 mg total) by mouth every 12 (twelve) hours as needed (pain). 60 tablet 2   No current facility-administered medications on file prior to visit.     Allergies  Allergen Reactions  . Latex Rash  . Wool Alcohol [Lanolin] Rash  . Amitriptyline Other (See Comments)    Leg cramps, trimmers     Family History  Problem Relation Age of Onset  . Diabetes Mother   . Breast cancer Mother   . Cervical cancer Mother   . Kidney cancer Father        Kidney Cancer  . Stroke Father         CVA  . Supraventricular tachycardia Sister   . Asthma Unknown        family history   BP 126/70   Pulse 82   Ht 5\' 7"  (1.702 m)   Wt 284 lb (128.8 kg)   SpO2 97%   BMI 44.48 kg/m   Review of Systems Denies fever.      Objective:   Physical Exam VITAL SIGNS:  See vs page.  GENERAL: no distress.  NECK: There is no palpable thyroid enlargement.  No thyroid nodule is palpable.  No palpable lymphadenopathy at the anterior neck.    Lab Results  Component Value Date   TSH 4.92 (H) 07/31/2016      Assessment & Plan:  Hyperthyroidism: slightly overcontrolled.  I have sent a prescription to your pharmacy, to reduce tapazole.

## 2016-07-31 NOTE — Patient Instructions (Signed)
blood tests are requested for you today.  We'll let you know about the results.  Please come back for a follow-up appointment in 6 months.  if ever you have fever while taking methimazole, stop it and call us, because of the risk of a rare side-effect.    

## 2016-08-14 ENCOUNTER — Encounter (HOSPITAL_COMMUNITY): Payer: Self-pay | Admitting: Cardiology

## 2016-08-15 ENCOUNTER — Other Ambulatory Visit: Payer: Self-pay | Admitting: Nurse Practitioner

## 2016-08-15 MED ORDER — FUROSEMIDE 20 MG PO TABS: 20.0000 mg | ORAL_TABLET | ORAL | 8 refills | Status: DC

## 2016-08-15 NOTE — Telephone Encounter (Signed)
Pt's medication was sent to pt's pharmacy as requested. Confirmation received.  °

## 2016-08-22 MED ORDER — FUROSEMIDE 20 MG PO TABS: 20.0000 mg | ORAL_TABLET | ORAL | 2 refills | Status: DC

## 2016-08-22 NOTE — Telephone Encounter (Signed)
Pt's medication was sent to the correct pharmacy as requested. confirmation received.

## 2016-08-22 NOTE — Addendum Note (Signed)
Addended by: Derl Barrow on: 08/22/2016 02:26 PM   Modules accepted: Orders

## 2016-09-16 ENCOUNTER — Encounter: Payer: Self-pay | Admitting: Internal Medicine

## 2016-09-29 ENCOUNTER — Encounter: Payer: Self-pay | Admitting: Internal Medicine

## 2016-09-29 ENCOUNTER — Other Ambulatory Visit: Payer: Self-pay | Admitting: Nurse Practitioner

## 2016-09-29 MED ORDER — CARVEDILOL 25 MG PO TABS: 25.0000 mg | ORAL_TABLET | Freq: Two times a day (BID) | ORAL | 2 refills | Status: DC

## 2016-09-29 NOTE — Telephone Encounter (Signed)
Pt's medication was sent to pt's pharmacy as requested. Confirmation received.  °

## 2016-10-09 ENCOUNTER — Encounter: Payer: Self-pay | Admitting: Internal Medicine

## 2016-10-11 MED ORDER — TRAMADOL HCL 50 MG PO TABS: 50.0000 mg | ORAL_TABLET | Freq: Two times a day (BID) | ORAL | 2 refills | Status: DC | PRN

## 2016-10-31 ENCOUNTER — Encounter: Payer: Self-pay | Admitting: Internal Medicine

## 2016-10-31 ENCOUNTER — Ambulatory Visit (INDEPENDENT_AMBULATORY_CARE_PROVIDER_SITE_OTHER): Payer: Medicare Other | Admitting: Internal Medicine

## 2016-10-31 ENCOUNTER — Other Ambulatory Visit (INDEPENDENT_AMBULATORY_CARE_PROVIDER_SITE_OTHER): Payer: Medicare Other

## 2016-10-31 VITALS — BP 118/60 | HR 77 | Temp 97.9°F | Ht 67.0 in | Wt 280.0 lb

## 2016-10-31 DIAGNOSIS — N183 Chronic kidney disease, stage 3 unspecified: Secondary | ICD-10-CM

## 2016-10-31 DIAGNOSIS — M1711 Unilateral primary osteoarthritis, right knee: Secondary | ICD-10-CM

## 2016-10-31 DIAGNOSIS — C44301 Unspecified malignant neoplasm of skin of nose: Secondary | ICD-10-CM

## 2016-10-31 DIAGNOSIS — Z23 Encounter for immunization: Secondary | ICD-10-CM

## 2016-10-31 DIAGNOSIS — I5032 Chronic diastolic (congestive) heart failure: Secondary | ICD-10-CM | POA: Diagnosis not present

## 2016-10-31 DIAGNOSIS — E118 Type 2 diabetes mellitus with unspecified complications: Secondary | ICD-10-CM | POA: Diagnosis not present

## 2016-10-31 DIAGNOSIS — I10 Essential (primary) hypertension: Secondary | ICD-10-CM | POA: Diagnosis not present

## 2016-10-31 DIAGNOSIS — Z0000 Encounter for general adult medical examination without abnormal findings: Secondary | ICD-10-CM | POA: Diagnosis not present

## 2016-10-31 DIAGNOSIS — E785 Hyperlipidemia, unspecified: Secondary | ICD-10-CM | POA: Diagnosis not present

## 2016-10-31 LAB — COMPREHENSIVE METABOLIC PANEL
ALK PHOS: 53 U/L (ref 39–117)
ALT: 8 U/L (ref 0–35)
AST: 12 U/L (ref 0–37)
Albumin: 4.2 g/dL (ref 3.5–5.2)
BUN: 39 mg/dL — ABNORMAL HIGH (ref 6–23)
CHLORIDE: 105 meq/L (ref 96–112)
CO2: 26 meq/L (ref 19–32)
Calcium: 9.5 mg/dL (ref 8.4–10.5)
Creatinine, Ser: 1.56 mg/dL — ABNORMAL HIGH (ref 0.40–1.20)
GFR: 35.17 mL/min — AB (ref >60.00)
GLUCOSE: 128 mg/dL — AB (ref 70–99)
POTASSIUM: 4.4 meq/L (ref 3.5–5.1)
SODIUM: 139 meq/L (ref 135–145)
TOTAL PROTEIN: 7.7 g/dL (ref 6.0–8.3)
Total Bilirubin: 0.7 mg/dL (ref 0.2–1.2)

## 2016-10-31 LAB — CBC
HEMATOCRIT: 40.4 % (ref 36.0–46.0)
HEMOGLOBIN: 13.4 g/dL (ref 12.0–15.0)
MCHC: 33.1 g/dL (ref 30.0–36.0)
MCV: 88.3 fl (ref 78.0–100.0)
PLATELETS: 283 10*3/uL (ref 150.0–400.0)
RBC: 4.58 Mil/uL (ref 3.87–5.11)
RDW: 14 % (ref 11.5–15.5)
WBC: 8.5 10*3/uL (ref 4.0–10.5)

## 2016-10-31 LAB — LIPID PANEL
CHOL/HDL RATIO: 2
Cholesterol: 109 mg/dL (ref 0–200)
HDL: 45.7 mg/dL (ref >39.00)
LDL CALC: 47 mg/dL (ref 0–99)
NONHDL: 63.29
Triglycerides: 79 mg/dL (ref 0.0–149.0)
VLDL: 15.8 mg/dL (ref 0.0–40.0)

## 2016-10-31 LAB — HEMOGLOBIN A1C: Hgb A1c MFr Bld: 5.6 % (ref 4.6–6.5)

## 2016-10-31 MED ORDER — PROMETHAZINE-DM 6.25-15 MG/5ML PO SYRP: 5.0000 mL | ORAL_SOLUTION | Freq: Four times a day (QID) | ORAL | 0 refills | Status: DC | PRN

## 2016-10-31 NOTE — Patient Instructions (Signed)
We have given you the pneumonia shot today.  We are checking the labs and have sent in the refill of cough medicine.   Health Maintenance, Female Adopting a healthy lifestyle and getting preventive care can go a long way to promote health and wellness. Talk with your health care provider about what schedule of regular examinations is right for you. This is a good chance for you to check in with your provider about disease prevention and staying healthy. In between checkups, there are plenty of things you can do on your own. Experts have done a lot of research about which lifestyle changes and preventive measures are most likely to keep you healthy. Ask your health care provider for more information. Weight and diet Eat a healthy diet  Be sure to include plenty of vegetables, fruits, low-fat dairy products, and lean protein.  Do not eat a lot of foods high in solid fats, added sugars, or salt.  Get regular exercise. This is one of the most important things you can do for your health. ? Most adults should exercise for at least 150 minutes each week. The exercise should increase your heart rate and make you sweat (moderate-intensity exercise). ? Most adults should also do strengthening exercises at least twice a week. This is in addition to the moderate-intensity exercise.  Maintain a healthy weight  Body mass index (BMI) is a measurement that can be used to identify possible weight problems. It estimates body fat based on height and weight. Your health care provider can help determine your BMI and help you achieve or maintain a healthy weight.  For females 53 years of age and older: ? A BMI below 18.5 is considered underweight. ? A BMI of 18.5 to 24.9 is normal. ? A BMI of 25 to 29.9 is considered overweight. ? A BMI of 30 and above is considered obese.  Watch levels of cholesterol and blood lipids  You should start having your blood tested for lipids and cholesterol at 67 years of age,  then have this test every 5 years.  You may need to have your cholesterol levels checked more often if: ? Your lipid or cholesterol levels are high. ? You are older than 67 years of age. ? You are at high risk for heart disease.  Cancer screening Lung Cancer  Lung cancer screening is recommended for adults 98-17 years old who are at high risk for lung cancer because of a history of smoking.  A yearly low-dose CT scan of the lungs is recommended for people who: ? Currently smoke. ? Have quit within the past 15 years. ? Have at least a 30-pack-year history of smoking. A pack year is smoking an average of one pack of cigarettes a day for 1 year.  Yearly screening should continue until it has been 15 years since you quit.  Yearly screening should stop if you develop a health problem that would prevent you from having lung cancer treatment.  Breast Cancer  Practice breast self-awareness. This means understanding how your breasts normally appear and feel.  It also means doing regular breast self-exams. Let your health care provider know about any changes, no matter how small.  If you are in your 20s or 30s, you should have a clinical breast exam (CBE) by a health care provider every 1-3 years as part of a regular health exam.  If you are 62 or older, have a CBE every year. Also consider having a breast X-ray (mammogram) every year.  If you have a family history of breast cancer, talk to your health care provider about genetic screening.  If you are at high risk for breast cancer, talk to your health care provider about having an MRI and a mammogram every year.  Breast cancer gene (BRCA) assessment is recommended for women who have family members with BRCA-related cancers. BRCA-related cancers include: ? Breast. ? Ovarian. ? Tubal. ? Peritoneal cancers.  Results of the assessment will determine the need for genetic counseling and BRCA1 and BRCA2 testing.  Cervical Cancer Your  health care provider may recommend that you be screened regularly for cancer of the pelvic organs (ovaries, uterus, and vagina). This screening involves a pelvic examination, including checking for microscopic changes to the surface of your cervix (Pap test). You may be encouraged to have this screening done every 3 years, beginning at age 53.  For women ages 32-65, health care providers may recommend pelvic exams and Pap testing every 3 years, or they may recommend the Pap and pelvic exam, combined with testing for human papilloma virus (HPV), every 5 years. Some types of HPV increase your risk of cervical cancer. Testing for HPV may also be done on women of any age with unclear Pap test results.  Other health care providers may not recommend any screening for nonpregnant women who are considered low risk for pelvic cancer and who do not have symptoms. Ask your health care provider if a screening pelvic exam is right for you.  If you have had past treatment for cervical cancer or a condition that could lead to cancer, you need Pap tests and screening for cancer for at least 20 years after your treatment. If Pap tests have been discontinued, your risk factors (such as having a new sexual partner) need to be reassessed to determine if screening should resume. Some women have medical problems that increase the chance of getting cervical cancer. In these cases, your health care provider may recommend more frequent screening and Pap tests.  Colorectal Cancer  This type of cancer can be detected and often prevented.  Routine colorectal cancer screening usually begins at 67 years of age and continues through 67 years of age.  Your health care provider may recommend screening at an earlier age if you have risk factors for colon cancer.  Your health care provider may also recommend using home test kits to check for hidden blood in the stool.  A small camera at the end of a tube can be used to examine your  colon directly (sigmoidoscopy or colonoscopy). This is done to check for the earliest forms of colorectal cancer.  Routine screening usually begins at age 67.  Direct examination of the colon should be repeated every 5-10 years through 67 years of age. However, you may need to be screened more often if early forms of precancerous polyps or small growths are found.  Skin Cancer  Check your skin from head to toe regularly.  Tell your health care provider about any new moles or changes in moles, especially if there is a change in a mole's shape or color.  Also tell your health care provider if you have a mole that is larger than the size of a pencil eraser.  Always use sunscreen. Apply sunscreen liberally and repeatedly throughout the day.  Protect yourself by wearing long sleeves, pants, a wide-brimmed hat, and sunglasses whenever you are outside.  Heart disease, diabetes, and high blood pressure  High blood pressure causes heart disease and  increases the risk of stroke. High blood pressure is more likely to develop in: ? People who have blood pressure in the high end of the normal range (130-139/85-89 mm Hg). ? People who are overweight or obese. ? People who are African American.  If you are 62-61 years of age, have your blood pressure checked every 3-5 years. If you are 44 years of age or older, have your blood pressure checked every year. You should have your blood pressure measured twice-once when you are at a hospital or clinic, and once when you are not at a hospital or clinic. Record the average of the two measurements. To check your blood pressure when you are not at a hospital or clinic, you can use: ? An automated blood pressure machine at a pharmacy. ? A home blood pressure monitor.  If you are between 17 years and 38 years old, ask your health care provider if you should take aspirin to prevent strokes.  Have regular diabetes screenings. This involves taking a blood sample  to check your fasting blood sugar level. ? If you are at a normal weight and have a low risk for diabetes, have this test once every three years after 67 years of age. ? If you are overweight and have a high risk for diabetes, consider being tested at a younger age or more often. Preventing infection Hepatitis B  If you have a higher risk for hepatitis B, you should be screened for this virus. You are considered at high risk for hepatitis B if: ? You were born in a country where hepatitis B is common. Ask your health care provider which countries are considered high risk. ? Your parents were born in a high-risk country, and you have not been immunized against hepatitis B (hepatitis B vaccine). ? You have HIV or AIDS. ? You use needles to inject street drugs. ? You live with someone who has hepatitis B. ? You have had sex with someone who has hepatitis B. ? You get hemodialysis treatment. ? You take certain medicines for conditions, including cancer, organ transplantation, and autoimmune conditions.  Hepatitis C  Blood testing is recommended for: ? Everyone born from [birth year] through 1966. ? Anyone with known risk factors for hepatitis C.  Sexually transmitted infections (STIs)  You should be screened for sexually transmitted infections (STIs) including gonorrhea and chlamydia if: ? You are sexually active and are younger than 67 years of age. ? You are older than 67 years of age and your health care provider tells you that you are at risk for this type of infection. ? Your sexual activity has changed since you were last screened and you are at an increased risk for chlamydia or gonorrhea. Ask your health care provider if you are at risk.  If you do not have HIV, but are at risk, it may be recommended that you take a prescription medicine daily to prevent HIV infection. This is called pre-exposure prophylaxis (PrEP). You are considered at risk if: ? You are sexually active and do not  regularly use condoms or know the HIV status of your partner(s). ? You take drugs by injection. ? You are sexually active with a partner who has HIV.  Talk with your health care provider about whether you are at high risk of being infected with HIV. If you choose to begin PrEP, you should first be tested for HIV. You should then be tested every 3 months for as long as you are taking  PrEP. Pregnancy  If you are premenopausal and you may become pregnant, ask your health care provider about preconception counseling.  If you may become pregnant, take 400 to 800 micrograms (mcg) of folic acid every day.  If you want to prevent pregnancy, talk to your health care provider about birth control (contraception). Osteoporosis and menopause  Osteoporosis is a disease in which the bones lose minerals and strength with aging. This can result in serious bone fractures. Your risk for osteoporosis can be identified using a bone density scan.  If you are 62 years of age or older, or if you are at risk for osteoporosis and fractures, ask your health care provider if you should be screened.  Ask your health care provider whether you should take a calcium or vitamin D supplement to lower your risk for osteoporosis.  Menopause may have certain physical symptoms and risks.  Hormone replacement therapy may reduce some of these symptoms and risks. Talk to your health care provider about whether hormone replacement therapy is right for you. Follow these instructions at home:  Schedule regular health, dental, and eye exams.  Stay current with your immunizations.  Do not use any tobacco products including cigarettes, chewing tobacco, or electronic cigarettes.  If you are pregnant, do not drink alcohol.  If you are breastfeeding, limit how much and how often you drink alcohol.  Limit alcohol intake to no more than 1 drink per day for nonpregnant women. One drink equals 12 ounces of beer, 5 ounces of wine, or  1 ounces of hard liquor.  Do not use street drugs.  Do not share needles.  Ask your health care provider for help if you need support or information about quitting drugs.  Tell your health care provider if you often feel depressed.  Tell your health care provider if you have ever been abused or do not feel safe at home. This information is not intended to replace advice given to you by your health care provider. Make sure you discuss any questions you have with your health care provider. Document Released: 08/08/2010 Document Revised: 07/01/2015 Document Reviewed: 10/27/2014 Elsevier Interactive Patient Education  [employer].

## 2016-10-31 NOTE — Progress Notes (Signed)
   Subjective:    Patient ID: Katie Higgins, female    DOB: January 07, 1951, 66 y.o.   MRN: [id number]  HPI Here for medicare wellness, no new complaints. Please see A/P for status and treatment of chronic medical problems.   HPI #2: Here for follow up of her morbid obesity (weight is up some from last year, she is not exercising at all, denies dietary changes, has some sweets and less sodas than last year), and her diabetes (taking ACE-I and statin, controlled by diet currently, denies worsening numbness or pains in her feet or hands, she does have some stable neuropathy, no low or high sugars), and her blood pressure (taking enalapril, coreg, lasix, denies side effects, no chest pains or SOB or palpitations). She is also having a new problem on her nose. There is a skin lesion that she is worried about. Started out looking like a scaly spot and some scabbing. She is wanting to know if she needs to have it removed.   Diet: DM since diabetic Physical activity: sedentary Depression/mood screen: negative Hearing: intact to whispered voice Visual acuity: grossly normal with lens, performs annual eye exam  ADLs: capable Fall risk: none Home safety: good Cognitive evaluation: intact to orientation, naming, recall and repetition EOL planning: adv directives discussed  I have personally reviewed and have noted 1. The patient's medical and social history - reviewed today no changes 2. Their use of alcohol, tobacco or illicit drugs 3. Their current medications and supplements 4. The patient's functional ability including ADL's, fall risks, home safety risks and hearing or visual impairment. 5. Diet and physical activities 6. Evidence for depression or mood disorders 7. Care team reviewed and updated (available in snapshot)  Review of Systems  Constitutional: Negative.   HENT: Negative.   Eyes: Negative.   Respiratory: Negative for cough, chest tightness and shortness of breath.     Cardiovascular: Negative for chest pain, palpitations and leg swelling.  Gastrointestinal: Negative for abdominal distention, abdominal pain, constipation, diarrhea, nausea and vomiting.  Musculoskeletal: Positive for arthralgias.  Skin: Negative.   Neurological: Negative.   Psychiatric/Behavioral: Negative.       Objective:   Physical Exam  Constitutional: She is oriented to person, place, and time. She appears well-developed and well-nourished.  Overweight  HENT:  Head: Normocephalic and atraumatic.  Eyes: EOM are normal.  Neck: Normal range of motion.  Cardiovascular: Normal rate and regular rhythm.   Pulmonary/Chest: Effort normal and breath sounds normal. No respiratory distress. She has no wheezes. She has no rales.  Abdominal: Soft. Bowel sounds are normal. She exhibits no distension. There is no tenderness. There is no rebound.  Musculoskeletal: She exhibits no edema.  Neurological: She is alert and oriented to person, place, and time. Coordination normal.  Skin: Skin is warm and dry.  Lesion on the left side of nose which appears cancerous.   Psychiatric: She has a normal mood and affect.   Vitals:   10/31/16 1533  BP: 118/60  Pulse: 77  Temp: 97.9 F (36.6 C)  TempSrc: Oral  SpO2: 99%  Weight: 280 lb (127 kg)  Height: 5\' 7"  (1.702 m)      Assessment & Plan:  Prevnar 13 and flu shot given at visit

## 2016-11-03 DIAGNOSIS — C44301 Unspecified malignant neoplasm of skin of nose: Secondary | ICD-10-CM | POA: Insufficient documentation

## 2016-11-03 NOTE — Assessment & Plan Note (Signed)
BP at goal on her enalapril and coreg. Checking CMP and adjust as needed.

## 2016-11-03 NOTE — Assessment & Plan Note (Signed)
BP at goal and diabetes controlled. Checking CMP for stability.

## 2016-11-03 NOTE — Assessment & Plan Note (Signed)
Declines colonoscopy, mammogram, dexa. She is given flu shot today. Prevnar 13 given to complete series today. Tetanus up to date. Counseled about sun safety and mole appears cancerous so referred to dermatology. Counseled about home safety and fall prevention. Given 10 year screening recommendations.

## 2016-11-03 NOTE — Assessment & Plan Note (Signed)
Uses tramadol prn for the pain and no red flags. Will continue to refill. She does get injections intermittently. She is not exercising and talked to her about the need to move more.

## 2016-11-03 NOTE — Assessment & Plan Note (Signed)
Diastolic, EF 79-02%, taking ACE-I and statin. Taking laix three times per week for fluid and as needed can increase. No flare today.

## 2016-11-03 NOTE — Assessment & Plan Note (Signed)
Weight is up about 2-3 pounds since last year. It is complicated by osteoarthritis, DM type 2, hypertension, hyperlipidemia. She is aware of the need to work on weight loss. She is not wanting more aggressive weight loss intervention at this time.

## 2016-11-03 NOTE — Assessment & Plan Note (Signed)
Complicated by neuropathy, foot exam done. Neuropathy stable with some callusing. Checking HgA1c and lipid panel and adjust as needed. Controlled by diet currently. Reminded about the need for eye exam as she is several years behind and she will schedule. Taking ACE-I.

## 2016-11-03 NOTE — Assessment & Plan Note (Signed)
Lesion on her left nose appears to be cancerous and needs removal. Talked to them about likely moh's surgery needed.

## 2016-11-03 NOTE — Assessment & Plan Note (Signed)
Taking lovastatin 10 mg daily, checking lipid panel and adjust as needed. Goal LDL <100 or <70 ideal.

## 2016-11-06 ENCOUNTER — Other Ambulatory Visit: Payer: Self-pay

## 2016-11-06 MED ORDER — PROMETHAZINE-DM 6.25-15 MG/5ML PO SYRP: 5.0000 mL | ORAL_SOLUTION | Freq: Four times a day (QID) | ORAL | 0 refills | Status: DC | PRN

## 2016-11-08 ENCOUNTER — Ambulatory Visit (INDEPENDENT_AMBULATORY_CARE_PROVIDER_SITE_OTHER): Payer: Medicare Other | Admitting: Endocrinology

## 2016-11-08 VITALS — BP 134/78 | HR 71 | Wt 286.4 lb

## 2016-11-08 DIAGNOSIS — E0580 Other thyrotoxicosis without thyrotoxic crisis or storm: Secondary | ICD-10-CM | POA: Diagnosis not present

## 2016-11-08 LAB — TSH: TSH: 2.25 u[IU]/mL (ref 0.35–4.50)

## 2016-11-08 LAB — T4, FREE: Free T4: 1.03 ng/dL (ref 0.60–1.60)

## 2016-11-08 NOTE — Patient Instructions (Signed)
blood tests are requested for you today.  We'll let you know about the results.  Please come back for a follow-up appointment in 6 months.  if ever you have fever while taking methimazole, stop it and call us, because of the risk of a rare side-effect.

## 2016-11-08 NOTE — Progress Notes (Signed)
Subjective:    Patient ID: Katie Higgins, female    DOB: 08-21-1950, 66 y.o.   MRN: [id number]  HPI Pt returns for f/u of hyperthyroidism (due to [place] dz; dx'ed 2010; US showed heterogeneity of texture and one small nodule; she has been unable to safely d/c tapazole for RAI rx, due to CHF).  pt states she feels well in general.  She takes tapazole as rx'ed.  Past Medical History:  Diagnosis Date  . ALLERGIC RHINITIS   . CARDIOMYOPATHY    Nonischemic. Admitted in 3/11 with CHF exac, due to hyperthyroidism. LHC (03/11) showed clean coronaries with EF 40%. Myoview showed EF 38%. Echo was a technically difficult study with mild to moderately decreased EF, mild LVH, mild MR. Repeat echo (9/11) with EF 08-65%, mild diastolic dysfunction (grade I).  . CARPAL TUNNEL SYNDROME, LEFT, MILD   . CKD (chronic kidney disease), stage III   . Congestive heart failure, unspecified   . DIABETES MELLITUS, CONTROLLED   . DYSLIPIDEMIA   . Graves disease   . HYPERTENSION   . HYPERTHYROIDISM   . Obesity   . Osteoarthritis    esp L hip, low back  . Palpitations   . Premature ventricular contraction     Past Surgical History:  Procedure Laterality Date  . CARDIAC CATHETERIZATION  04/19/09  . CARDIAC CATHETERIZATION N/A 09/30/2015   Procedure: Left Heart Cath and Coronary Angiography;  Surgeon: Larey Dresser, MD;  Location: [facility];  Service: Cardiovascular;  Laterality: N/A;  . CHOLECYSTECTOMY    . remote hernia repair      Social History   Social History  . Marital status: Widowed    Spouse name: N/A  . Number of children: N/A  . Years of education: N/A   Occupational History  . Not on file.   Social History Main Topics  . Smoking status: Never Smoker  . Smokeless tobacco: Never Used  . Alcohol use No  . Drug use: No  . Sexual activity: Not on file   Other Topics Concern  . Not on file   Social History Narrative   Lives in [place] with her son.   Widowed since  1992.   She is very sedentary, uses a wheelchair when out of the house.   Ambulatory in home with cane.    Current Outpatient Prescriptions on File Prior to Visit  Medication Sig Dispense Refill  . acetaminophen (TYLENOL) 500 MG tablet Take 500 mg by mouth every 6 (six) hours as needed for moderate pain.    [name] aspirin 81 MG tablet Take 81 mg by mouth daily.      . carvedilol (COREG) 25 MG tablet Take 1 tablet (25 mg total) by mouth 2 (two) times daily. 180 tablet 2  . colchicine 0.6 MG tablet Take 0.6 mg by mouth daily as needed. As directed    . cyclobenzaprine (FLEXERIL) 10 MG tablet Take 1 tablet (10 mg total) by mouth every 8 (eight) hours as needed for muscle spasms. 90 tablet 6  . enalapril (VASOTEC) 10 MG tablet Take 1 tablet (10 mg total) by mouth every evening. 90 tablet 3  . furosemide (LASIX) 20 MG tablet Take 1 tablet (20 mg total) by mouth 3 (three) times a week. 45 tablet 2  . glucose blood (FREESTYLE TEST STRIPS) test strip Use to check blood sugars twice a day 300 each 1  . lovastatin (MEVACOR) 10 MG tablet Take 1 tablet (10 mg total) by mouth at  bedtime. 90 tablet 3  . meloxicam (MOBIC) 15 MG tablet Take 1 tablet (15 mg total) by mouth daily. 90 tablet 3  . methimazole (TAPAZOLE) 5 MG tablet Take 1 tablet (5 mg total) by mouth 4 (four) times a week. 50 tablet 2  . Multiple Vitamin (MULTIVITAMIN) tablet Take 1 tablet by mouth daily.      . ondansetron (ZOFRAN) 8 MG tablet Take 1 tablet (8 mg total) by mouth every 8 (eight) hours as needed for nausea or vomiting. 90 tablet 3  . promethazine-dextromethorphan (PROMETHAZINE-DM) 6.25-15 MG/5ML syrup Take 5 mLs by mouth 4 (four) times daily as needed for cough. 118 mL 0  . traMADol (ULTRAM) 50 MG tablet Take 1 tablet (50 mg total) by mouth every 12 (twelve) hours as needed (pain). 60 tablet 2   No current facility-administered medications on file prior to visit.     Allergies  Allergen Reactions  . Latex Rash  . Wool Alcohol  [Lanolin] Rash  . Amitriptyline Other (See Comments)    Leg cramps, trimmers     Family History  Problem Relation Age of Onset  . Diabetes Mother   . Breast cancer Mother   . Cervical cancer Mother   . Kidney cancer Father        Kidney Cancer  . Stroke Father        CVA  . Supraventricular tachycardia Sister   . Asthma Unknown        family history    BP 134/78   Pulse 71   Wt 286 lb 6.4 oz (129.9 kg)   SpO2 98%   BMI 44.86 kg/m    Review of Systems Denies fever.     Objective:   Physical Exam VITAL SIGNS:  See vs page GENERAL: no distress NECK: There is no palpable thyroid enlargement.  No thyroid nodule is palpable.  No palpable lymphadenopathy at the anterior neck.      Assessment & Plan:  Hyperthyroidism: due for recheck.    Patient Instructions  blood tests are requested for you today.  We'll let you know about the results.  Please come back for a follow-up appointment in 6 months.  if ever you have fever while taking methimazole, stop it and call us, because of the risk of a rare side-effect.

## 2016-11-14 ENCOUNTER — Encounter: Payer: Self-pay | Admitting: Internal Medicine

## 2016-11-14 ENCOUNTER — Ambulatory Visit: Payer: Medicare Other | Admitting: Family Medicine

## 2016-11-20 ENCOUNTER — Other Ambulatory Visit: Payer: Self-pay | Admitting: Internal Medicine

## 2016-11-20 MED ORDER — CYCLOBENZAPRINE HCL 10 MG PO TABS: 10.0000 mg | ORAL_TABLET | Freq: Three times a day (TID) | ORAL | 6 refills | Status: DC | PRN

## 2016-11-20 MED ORDER — CYCLOBENZAPRINE HCL 10 MG PO TABS: 10.0000 mg | ORAL_TABLET | Freq: Three times a day (TID) | ORAL | 1 refills | Status: DC | PRN

## 2016-11-27 DIAGNOSIS — C44311 Basal cell carcinoma of skin of nose: Secondary | ICD-10-CM | POA: Diagnosis not present

## 2016-12-27 ENCOUNTER — Encounter: Payer: Self-pay | Admitting: Nurse Practitioner

## 2017-01-01 ENCOUNTER — Other Ambulatory Visit: Payer: Self-pay | Admitting: Nurse Practitioner

## 2017-01-01 DIAGNOSIS — I493 Ventricular premature depolarization: Secondary | ICD-10-CM

## 2017-01-01 DIAGNOSIS — I5032 Chronic diastolic (congestive) heart failure: Secondary | ICD-10-CM

## 2017-01-01 MED ORDER — ENALAPRIL MALEATE 10 MG PO TABS: 10.0000 mg | ORAL_TABLET | Freq: Every evening | ORAL | 1 refills | Status: DC

## 2017-01-01 NOTE — Telephone Encounter (Signed)
Pt's medication was sent to pt's pharmacy as requested. Confirmation received.  °

## 2017-01-15 ENCOUNTER — Encounter: Payer: Self-pay | Admitting: Internal Medicine

## 2017-01-17 ENCOUNTER — Other Ambulatory Visit: Payer: Self-pay | Admitting: Internal Medicine

## 2017-01-17 DIAGNOSIS — M1712 Unilateral primary osteoarthritis, left knee: Secondary | ICD-10-CM

## 2017-01-17 DIAGNOSIS — M1711 Unilateral primary osteoarthritis, right knee: Secondary | ICD-10-CM

## 2017-01-17 MED ORDER — TRAMADOL HCL 50 MG PO TABS: 50.0000 mg | ORAL_TABLET | Freq: Two times a day (BID) | ORAL | 0 refills | Status: DC | PRN

## 2017-01-17 NOTE — Telephone Encounter (Signed)
MD is out of the office pls advise on refill../lmb 

## 2017-01-24 ENCOUNTER — Other Ambulatory Visit (INDEPENDENT_AMBULATORY_CARE_PROVIDER_SITE_OTHER): Payer: Medicare Other

## 2017-01-24 ENCOUNTER — Encounter: Payer: Self-pay | Admitting: Endocrinology

## 2017-01-24 ENCOUNTER — Ambulatory Visit (INDEPENDENT_AMBULATORY_CARE_PROVIDER_SITE_OTHER): Payer: Medicare Other | Admitting: Endocrinology

## 2017-01-24 VITALS — BP 155/63 | HR 78 | Wt 291.2 lb

## 2017-01-24 DIAGNOSIS — E0580 Other thyrotoxicosis without thyrotoxic crisis or storm: Secondary | ICD-10-CM | POA: Diagnosis not present

## 2017-01-24 LAB — T4, FREE: Free T4: 0.96 ng/dL (ref 0.60–1.60)

## 2017-01-24 LAB — TSH: TSH: 2.72 u[IU]/mL (ref 0.35–4.50)

## 2017-01-24 NOTE — Patient Instructions (Signed)
blood tests are requested for you today.  We'll let you know about the results.   Please come back for a follow-up appointment in 6 months.   If ever you have fever while taking methimazole, stop it and call us, even if the reason is obvious, because of the risk of a rare side-effect.   

## 2017-01-24 NOTE — Progress Notes (Signed)
Subjective:    Patient ID: Katie Higgins, female    DOB: November 29, 1950, 66 y.o.   MRN: [id number]  HPI Pt returns for f/u of hyperthyroidism (due to [place] dz; dx'ed 2010; US showed heterogeneity of texture and one small nodule; she has been unable to safely d/c tapazole for RAI rx, due to CHF).  pt states she feels well in general.  She takes tapazole as rx'ed.  Past Medical History:  Diagnosis Date  . ALLERGIC RHINITIS   . CARDIOMYOPATHY    Nonischemic. Admitted in 3/11 with CHF exac, due to hyperthyroidism. LHC (03/11) showed clean coronaries with EF 40%. Myoview showed EF 38%. Echo was a technically difficult study with mild to moderately decreased EF, mild LVH, mild MR. Repeat echo (9/11) with EF 22-29%, mild diastolic dysfunction (grade I).  . CARPAL TUNNEL SYNDROME, LEFT, MILD   . CKD (chronic kidney disease), stage III ([place])   . Congestive heart failure, unspecified   . DIABETES MELLITUS, CONTROLLED   . DYSLIPIDEMIA   . Graves disease   . HYPERTENSION   . HYPERTHYROIDISM   . Obesity   . Osteoarthritis    esp L hip, low back  . Palpitations   . Premature ventricular contraction     Past Surgical History:  Procedure Laterality Date  . CARDIAC CATHETERIZATION  04/19/09  . CARDIAC CATHETERIZATION N/A 09/30/2015   Procedure: Left Heart Cath and Coronary Angiography;  Surgeon: Larey Dresser, MD;  Location: [facility];  Service: Cardiovascular;  Laterality: N/A;  . CHOLECYSTECTOMY    . remote hernia repair      Social History   Socioeconomic History  . Marital status: Widowed    Spouse name: Not on file  . Number of children: Not on file  . Years of education: Not on file  . Highest education level: Not on file  Social Needs  . Financial resource strain: Not on file  . Food insecurity - worry: Not on file  . Food insecurity - inability: Not on file  . Transportation needs - medical: Not on file  . Transportation needs - non-medical: Not on file    Occupational History  . Not on file  Tobacco Use  . Smoking status: Never Smoker  . Smokeless tobacco: Never Used  Substance and Sexual Activity  . Alcohol use: No  . Drug use: No  . Sexual activity: Not on file  Other Topics Concern  . Not on file  Social History Narrative   Lives in [place] with her son.   Widowed since 1992.   She is very sedentary, uses a wheelchair when out of the house.   Ambulatory in home with cane.    Current Outpatient Medications on File Prior to Visit  Medication Sig Dispense Refill  . acetaminophen (TYLENOL) 500 MG tablet Take 500 mg by mouth every 6 (six) hours as needed for moderate pain.    [name] aspirin 81 MG tablet Take 81 mg by mouth daily.      . carvedilol (COREG) 25 MG tablet Take 1 tablet (25 mg total) by mouth 2 (two) times daily. 180 tablet 2  . colchicine 0.6 MG tablet Take 0.6 mg by mouth daily as needed. As directed    . cyclobenzaprine (FLEXERIL) 10 MG tablet Take 1 tablet (10 mg total) by mouth every 8 (eight) hours as needed for muscle spasms. 90 tablet 6  . enalapril (VASOTEC) 10 MG tablet Take 1 tablet (10 mg total) by mouth every evening.  Please make appt with Doctor for April before anymore refills. Thanks 90 tablet 1  . furosemide (LASIX) 20 MG tablet Take 1 tablet (20 mg total) by mouth 3 (three) times a week. 45 tablet 2  . glucose blood (FREESTYLE TEST STRIPS) test strip Use to check blood sugars twice a day 300 each 1  . lovastatin (MEVACOR) 10 MG tablet Take 1 tablet (10 mg total) by mouth at bedtime. 90 tablet 3  . meloxicam (MOBIC) 15 MG tablet Take 1 tablet (15 mg total) by mouth daily. 90 tablet 3  . methimazole (TAPAZOLE) 5 MG tablet Take 1 tablet (5 mg total) by mouth 4 (four) times a week. 50 tablet 2  . Multiple Vitamin (MULTIVITAMIN) tablet Take 1 tablet by mouth daily.      . ondansetron (ZOFRAN) 8 MG tablet Take 1 tablet (8 mg total) by mouth every 8 (eight) hours as needed for nausea or vomiting. 90 tablet 3  .  promethazine-dextromethorphan (PROMETHAZINE-DM) 6.25-15 MG/5ML syrup Take 5 mLs by mouth 4 (four) times daily as needed for cough. 118 mL 0  . traMADol (ULTRAM) 50 MG tablet Take 1 tablet (50 mg total) by mouth every 12 (twelve) hours as needed (pain). 60 tablet 0   No current facility-administered medications on file prior to visit.     Allergies  Allergen Reactions  . Latex Rash  . Wool Alcohol [Lanolin] Rash  . Amitriptyline Other (See Comments)    Leg cramps, trimmers     Family History  Problem Relation Age of Onset  . Diabetes Mother   . Breast cancer Mother   . Cervical cancer Mother   . Kidney cancer Father        Kidney Cancer  . Stroke Father        CVA  . Supraventricular tachycardia Sister   . Asthma Unknown        family history    BP (!) 155/63 (BP Location: Left Wrist, Patient Position: Sitting, Cuff Size: Normal)   Pulse 78   Wt 291 lb 3.2 oz (132.1 kg)   SpO2 98%   BMI 45.61 kg/m    Review of Systems Denies fever    Objective:   Physical Exam VITAL SIGNS:  See vs page GENERAL: no distress NECK: There is no palpable thyroid enlargement.  No thyroid nodule is palpable.  No palpable lymphadenopathy at the anterior neck.      Assessment & Plan:  Hyperthyroidism: due for recheck.    Patient Instructions  blood tests are requested for you today.  We'll let you know about the results.  Please come back for a follow-up appointment in 6 months.  If ever you have fever while taking methimazole, stop it and call us, even if the reason is obvious, because of the risk of a rare side-effect.

## 2017-02-15 DIAGNOSIS — C44311 Basal cell carcinoma of skin of nose: Secondary | ICD-10-CM | POA: Diagnosis not present

## 2017-03-02 IMAGING — NM NM MISC PROCEDURE
8 series · 48 of 48 positions shown · non-contrast
Comparison: none

[Series 1: wbr_s-proj_st stress-gsp · 6.40mm/px · 6 of 512 frames shown]
[frame 43/512]
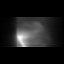
[frame 128/512]
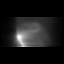
[frame 214/512]
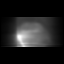
[frame 299/512]
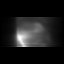
[frame 384/512]
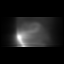
[frame 470/512]
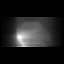

[Series 1: rest-mc · 6.40mm/px · 6 of 64 frames shown]
[frame 6/64]
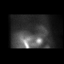
[frame 16/64]
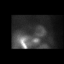
[frame 27/64]
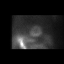
[frame 38/64]
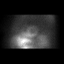
[frame 48/64]
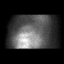
[frame 59/64]
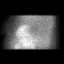

[Series 1: stress-gsp · 6.40mm/px · 6 of 507 frames shown]
[frame 43/507]
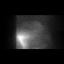
[frame 127/507]
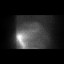
[frame 212/507]
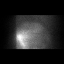
[frame 296/507]
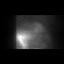
[frame 381/507]
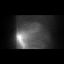
[frame 465/507]
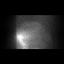

[Series 1: wbr_s-proj_st stress-sum-em · 6.40mm/px · 6 of 64 frames shown]
[frame 6/64]
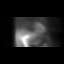
[frame 16/64]
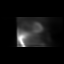
[frame 27/64]
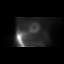
[frame 38/64]
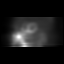
[frame 48/64]
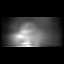
[frame 59/64]
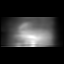

[Series 1: wbr_r-proj_st rest · 6.40mm/px · 6 of 64 frames shown]
[frame 6/64]
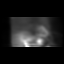
[frame 16/64]
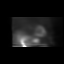
[frame 27/64]
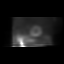
[frame 38/64]
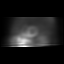
[frame 48/64]
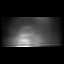
[frame 59/64]
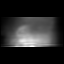

[Series 1: stress-sum-em · 6.40mm/px · 6 of 64 frames shown]
[frame 6/64]
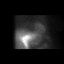
[frame 16/64]
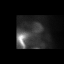
[frame 27/64]
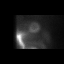
[frame 38/64]
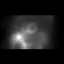
[frame 48/64]
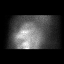
[frame 59/64]
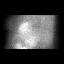

[Series 1: rest · 6.40mm/px · 6 of 64 frames shown]
[frame 6/64]
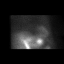
[frame 16/64]
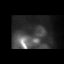
[frame 27/64]
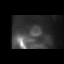
[frame 38/64]
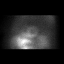
[frame 48/64]
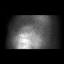
[frame 59/64]
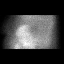

[Series 1: wbr_(id)_o1 rest-mc · 6.40mm/px · 6 of 64 frames shown]
[frame 6/64]
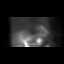
[frame 16/64]
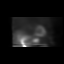
[frame 27/64]
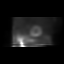
[frame 38/64]
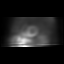
[frame 48/64]
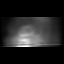
[frame 59/64]
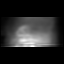

[48 of 48 positions shown; findings below may reference images not displayed]

Canned report from images found in remote index.

Refer to host system for actual result text.

## 2017-03-12 ENCOUNTER — Encounter: Payer: Self-pay | Admitting: Internal Medicine

## 2017-03-12 ENCOUNTER — Other Ambulatory Visit: Payer: Self-pay

## 2017-03-12 MED ORDER — MELOXICAM 15 MG PO TABS: 15.0000 mg | ORAL_TABLET | Freq: Every day | ORAL | 2 refills | Status: DC

## 2017-03-26 ENCOUNTER — Other Ambulatory Visit: Payer: Self-pay | Admitting: Internal Medicine

## 2017-03-26 DIAGNOSIS — M1711 Unilateral primary osteoarthritis, right knee: Secondary | ICD-10-CM

## 2017-03-26 DIAGNOSIS — M1712 Unilateral primary osteoarthritis, left knee: Secondary | ICD-10-CM

## 2017-03-26 MED ORDER — TRAMADOL HCL 50 MG PO TABS: 50.0000 mg | ORAL_TABLET | Freq: Two times a day (BID) | ORAL | 0 refills | Status: DC | PRN

## 2017-03-27 NOTE — Telephone Encounter (Signed)
Faxed script to [facility].Marland KitchenJohny Higgins

## 2017-04-07 ENCOUNTER — Encounter: Payer: Self-pay | Admitting: Internal Medicine

## 2017-05-09 ENCOUNTER — Ambulatory Visit (INDEPENDENT_AMBULATORY_CARE_PROVIDER_SITE_OTHER): Payer: Medicare Other | Admitting: Endocrinology

## 2017-05-09 ENCOUNTER — Encounter: Payer: Self-pay | Admitting: Endocrinology

## 2017-05-09 VITALS — BP 130/70 | HR 78 | Temp 99.0°F | Ht 67.0 in | Wt 286.2 lb

## 2017-05-09 DIAGNOSIS — E0580 Other thyrotoxicosis without thyrotoxic crisis or storm: Secondary | ICD-10-CM

## 2017-05-09 DIAGNOSIS — E118 Type 2 diabetes mellitus with unspecified complications: Secondary | ICD-10-CM | POA: Diagnosis not present

## 2017-05-09 LAB — POCT GLYCOSYLATED HEMOGLOBIN (HGB A1C): Hemoglobin A1C: 5.7

## 2017-05-09 NOTE — Progress Notes (Signed)
Subjective:    Patient ID: Katie Higgins, female    DOB: 14-Feb-1950, 67 y.o.   MRN: [id number]  HPI Pt returns for f/u of hyperthyroidism (due to [place] dz; dx'ed 2010; US showed heterogeneity of texture and one small nodule; she has been unable to safely d/c tapazole for RAI rx, due to CHF).  pt states she feels well in general.  She takes tapazole as rx'ed.  Past Medical History:  Diagnosis Date  . ALLERGIC RHINITIS   . CARDIOMYOPATHY    Nonischemic. Admitted in 3/11 with CHF exac, due to hyperthyroidism. LHC (03/11) showed clean coronaries with EF 40%. Myoview showed EF 38%. Echo was a technically difficult study with mild to moderately decreased EF, mild LVH, mild MR. Repeat echo (9/11) with EF 97-67%, mild diastolic dysfunction (grade I).  . CARPAL TUNNEL SYNDROME, LEFT, MILD   . CKD (chronic kidney disease), stage III ([place])   . Congestive heart failure, unspecified   . DIABETES MELLITUS, CONTROLLED   . DYSLIPIDEMIA   . Graves disease   . HYPERTENSION   . HYPERTHYROIDISM   . Obesity   . Osteoarthritis    esp L hip, low back  . Palpitations   . Premature ventricular contraction     Past Surgical History:  Procedure Laterality Date  . CARDIAC CATHETERIZATION  04/19/09  . CARDIAC CATHETERIZATION N/A 09/30/2015   Procedure: Left Heart Cath and Coronary Angiography;  Surgeon: Larey Dresser, MD;  Location: [facility];  Service: Cardiovascular;  Laterality: N/A;  . CHOLECYSTECTOMY    . remote hernia repair      Social History   Socioeconomic History  . Marital status: Widowed    Spouse name: Not on file  . Number of children: Not on file  . Years of education: Not on file  . Highest education level: Not on file  Occupational History  . Not on file  Social Needs  . Financial resource strain: Not on file  . Food insecurity:    Worry: Not on file    Inability: Not on file  . Transportation needs:    Medical: Not on file    Non-medical: Not on file    Tobacco Use  . Smoking status: Never Smoker  . Smokeless tobacco: Never Used  Substance and Sexual Activity  . Alcohol use: No  . Drug use: No  . Sexual activity: Not on file  Lifestyle  . Physical activity:    Days per week: Not on file    Minutes per session: Not on file  . Stress: Not on file  Relationships  . Social connections:    Talks on phone: Not on file    Gets together: Not on file    Attends religious service: Not on file    Active member of club or organization: Not on file    Attends meetings of clubs or organizations: Not on file    Relationship status: Not on file  . Intimate partner violence:    Fear of current or ex partner: Not on file    Emotionally abused: Not on file    Physically abused: Not on file    Forced sexual activity: Not on file  Other Topics Concern  . Not on file  Social History Narrative   Lives in [place] with her son.   Widowed since 1992.   She is very sedentary, uses a wheelchair when out of the house.   Ambulatory in home with cane.  Current Outpatient Medications on File Prior to Visit  Medication Sig Dispense Refill  . acetaminophen (TYLENOL) 500 MG tablet Take 500 mg by mouth every 6 (six) hours as needed for moderate pain.    [name] aspirin 81 MG tablet Take 81 mg by mouth daily.      . carvedilol (COREG) 25 MG tablet Take 1 tablet (25 mg total) by mouth 2 (two) times daily. 180 tablet 2  . colchicine 0.6 MG tablet Take 0.6 mg by mouth daily as needed. As directed    . cyclobenzaprine (FLEXERIL) 10 MG tablet Take 1 tablet (10 mg total) by mouth every 8 (eight) hours as needed for muscle spasms. 90 tablet 6  . enalapril (VASOTEC) 10 MG tablet Take 1 tablet (10 mg total) by mouth every evening. Please make appt with Doctor for April before anymore refills. Thanks 90 tablet 1  . furosemide (LASIX) 20 MG tablet Take 1 tablet (20 mg total) by mouth 3 (three) times a week. 45 tablet 2  . glucose blood (FREESTYLE TEST STRIPS) test  strip Use to check blood sugars twice a day 300 each 1  . lovastatin (MEVACOR) 10 MG tablet Take 1 tablet (10 mg total) by mouth at bedtime. 90 tablet 3  . meloxicam (MOBIC) 15 MG tablet Take 1 tablet (15 mg total) by mouth daily. 90 tablet 2  . methimazole (TAPAZOLE) 5 MG tablet Take 1 tablet (5 mg total) by mouth 4 (four) times a week. 50 tablet 2  . Multiple Vitamin (MULTIVITAMIN) tablet Take 1 tablet by mouth daily.      . ondansetron (ZOFRAN) 8 MG tablet Take 1 tablet (8 mg total) by mouth every 8 (eight) hours as needed for nausea or vomiting. 90 tablet 3  . traMADol (ULTRAM) 50 MG tablet Take 1 tablet (50 mg total) by mouth every 12 (twelve) hours as needed (pain). 60 tablet 0   No current facility-administered medications on file prior to visit.     Allergies  Allergen Reactions  . Latex Rash  . Wool Alcohol [Lanolin] Rash  . Amitriptyline Other (See Comments)    Leg cramps, trimmers     Family History  Problem Relation Age of Onset  . Diabetes Mother   . Breast cancer Mother   . Cervical cancer Mother   . Kidney cancer Father        Kidney Cancer  . Stroke Father        CVA  . Supraventricular tachycardia Sister   . Asthma Unknown        family history    BP 130/70 (BP Location: Left Arm, Patient Position: Sitting, Cuff Size: Normal)   Pulse 78   Temp 99 F (37.2 C) (Oral)   Ht 5\' 7"  (1.702 m)   Wt 286 lb 3.2 oz (129.8 kg)   SpO2 98%   BMI 44.83 kg/m    Review of Systems Denies fever    Objective:   Physical Exam VITAL SIGNS:  See vs page GENERAL: no distress NECK: There is no palpable thyroid enlargement.  No thyroid nodule is palpable.  No palpable lymphadenopathy at the anterior neck.   Lab Results  Component Value Date   HGBA1C 5.7 05/09/2017      Assessment & Plan:  Hyperthyroidism, due for recheck.  Hyperglycemia, stable.   Patient Instructions  Thyroid blood tests are requested for you today.  We'll let you know about the results.   Please come back for a follow-up appointment in 6 months.  If ever you have fever while taking methimazole, stop it and call us, even if the reason is obvious, because of the risk of a rare side-effect.

## 2017-05-09 NOTE — Patient Instructions (Signed)
Thyroid blood tests are requested for you today.  We'll let you know about the results.   Please come back for a follow-up appointment in 6 months.   If ever you have fever while taking methimazole, stop it and call us, even if the reason is obvious, because of the risk of a rare side-effect.    

## 2017-05-10 LAB — T4, FREE: Free T4: 1.16 ng/dL (ref 0.60–1.60)

## 2017-05-10 LAB — TSH: TSH: 2.24 u[IU]/mL (ref 0.35–4.50)

## 2017-05-29 ENCOUNTER — Other Ambulatory Visit: Payer: Self-pay | Admitting: Internal Medicine

## 2017-05-29 DIAGNOSIS — M1712 Unilateral primary osteoarthritis, left knee: Secondary | ICD-10-CM

## 2017-05-29 DIAGNOSIS — M1711 Unilateral primary osteoarthritis, right knee: Secondary | ICD-10-CM

## 2017-05-29 NOTE — Telephone Encounter (Signed)
Control database checked last refill: 03/04/2014 LOV: 10/31/2016 NOV: 06/14/2017

## 2017-05-31 MED ORDER — TRAMADOL HCL 50 MG PO TABS: 50.0000 mg | ORAL_TABLET | Freq: Every day | ORAL | 0 refills | Status: DC | PRN

## 2017-06-08 ENCOUNTER — Ambulatory Visit: Payer: Medicare Other | Admitting: Internal Medicine

## 2017-06-14 ENCOUNTER — Ambulatory Visit: Payer: Medicare Other | Admitting: Internal Medicine

## 2017-06-29 ENCOUNTER — Other Ambulatory Visit: Payer: Self-pay

## 2017-06-29 ENCOUNTER — Encounter: Payer: Self-pay | Admitting: Endocrinology

## 2017-06-29 ENCOUNTER — Other Ambulatory Visit: Payer: Self-pay | Admitting: Internal Medicine

## 2017-06-29 DIAGNOSIS — M1712 Unilateral primary osteoarthritis, left knee: Secondary | ICD-10-CM

## 2017-06-29 DIAGNOSIS — M1711 Unilateral primary osteoarthritis, right knee: Secondary | ICD-10-CM

## 2017-06-29 MED ORDER — METHIMAZOLE 5 MG PO TABS: 5.0000 mg | ORAL_TABLET | ORAL | 2 refills | Status: DC

## 2017-06-29 MED ORDER — TRAMADOL HCL 50 MG PO TABS: 50.0000 mg | ORAL_TABLET | Freq: Every day | ORAL | 0 refills | Status: DC | PRN

## 2017-06-29 NOTE — Telephone Encounter (Signed)
LOV 05/09/2017

## 2017-07-02 ENCOUNTER — Encounter: Payer: Self-pay | Admitting: Internal Medicine

## 2017-07-03 ENCOUNTER — Other Ambulatory Visit: Payer: Self-pay

## 2017-07-03 DIAGNOSIS — R0602 Shortness of breath: Secondary | ICD-10-CM

## 2017-07-03 DIAGNOSIS — I5032 Chronic diastolic (congestive) heart failure: Secondary | ICD-10-CM

## 2017-07-03 MED ORDER — CARVEDILOL 25 MG PO TABS: 25.0000 mg | ORAL_TABLET | Freq: Two times a day (BID) | ORAL | 3 refills | Status: DC

## 2017-07-03 MED ORDER — LOVASTATIN 10 MG PO TABS: 10.0000 mg | ORAL_TABLET | Freq: Every day | ORAL | 3 refills | Status: DC

## 2017-07-10 ENCOUNTER — Other Ambulatory Visit: Payer: Self-pay | Admitting: Internal Medicine

## 2017-07-10 ENCOUNTER — Encounter: Payer: Self-pay | Admitting: Internal Medicine

## 2017-07-10 DIAGNOSIS — M1711 Unilateral primary osteoarthritis, right knee: Secondary | ICD-10-CM

## 2017-07-10 DIAGNOSIS — M1712 Unilateral primary osteoarthritis, left knee: Secondary | ICD-10-CM

## 2017-07-11 ENCOUNTER — Other Ambulatory Visit: Payer: Self-pay | Admitting: Family

## 2017-07-11 DIAGNOSIS — M1711 Unilateral primary osteoarthritis, right knee: Secondary | ICD-10-CM

## 2017-07-11 DIAGNOSIS — M1712 Unilateral primary osteoarthritis, left knee: Secondary | ICD-10-CM

## 2017-07-11 MED ORDER — TRAMADOL HCL 50 MG PO TABS: 50.0000 mg | ORAL_TABLET | Freq: Every day | ORAL | 0 refills | Status: DC | PRN

## 2017-07-25 ENCOUNTER — Encounter: Payer: Self-pay | Admitting: Internal Medicine

## 2017-07-25 ENCOUNTER — Ambulatory Visit (INDEPENDENT_AMBULATORY_CARE_PROVIDER_SITE_OTHER): Payer: Medicare Other | Admitting: Internal Medicine

## 2017-07-25 VITALS — BP 124/74 | HR 67 | Ht 67.0 in | Wt 296.0 lb

## 2017-07-25 DIAGNOSIS — I10 Essential (primary) hypertension: Secondary | ICD-10-CM

## 2017-07-25 DIAGNOSIS — I5032 Chronic diastolic (congestive) heart failure: Secondary | ICD-10-CM | POA: Diagnosis not present

## 2017-07-25 DIAGNOSIS — I493 Ventricular premature depolarization: Secondary | ICD-10-CM

## 2017-07-25 MED ORDER — ENALAPRIL MALEATE 10 MG PO TABS: 10.0000 mg | ORAL_TABLET | Freq: Every evening | ORAL | 3 refills | Status: DC

## 2017-07-25 NOTE — Patient Instructions (Addendum)
Medication Instructions:  Your physician recommends that you continue on your current medications as directed. Please refer to the Current Medication list given to you today.  Labwork: You will get lab work today:  BMP  Testing/Procedures: None ordered.  Follow-Up: Your physician wants you to follow-up in: 12 months with Chanetta Marshall, NP.   You will receive a reminder letter in the mail two months in advance. If you don't receive a letter, please call our office to schedule the follow-up appointment.  Any Other Special Instructions Will Be Listed Below (If Applicable).  If you need a refill on your cardiac medications before your next appointment, please call your pharmacy.

## 2017-07-25 NOTE — Progress Notes (Signed)
PCP: Hoyt Koch, MD Primary Cardiologist: Dr [name] Primary EP: Dr [name]  Katie Higgins is a 67 y.o. female who presents today for routine electrophysiology followup.  Since last being seen in our clinic, the patient reports doing very well.  Today, she denies symptoms of palpitations, chest pain, shortness of breath,  lower extremity edema, dizziness, presyncope, or syncope.  The patient is otherwise without complaint today.   Past Medical History:  Diagnosis Date  . ALLERGIC RHINITIS   . CARDIOMYOPATHY    Nonischemic. Admitted in 3/11 with CHF exac, due to hyperthyroidism. LHC (03/11) showed clean coronaries with EF 40%. Myoview showed EF 38%. Echo was a technically difficult study with mild to moderately decreased EF, mild LVH, mild MR. Repeat echo (9/11) with EF 32-67%, mild diastolic dysfunction (grade I).  . CARPAL TUNNEL SYNDROME, LEFT, MILD   . CKD (chronic kidney disease), stage III ([place])   . Congestive heart failure, unspecified   . DIABETES MELLITUS, CONTROLLED   . DYSLIPIDEMIA   . Graves disease   . HYPERTENSION   . HYPERTHYROIDISM   . Obesity   . Osteoarthritis    esp L hip, low back  . Palpitations   . Premature ventricular contraction    Past Surgical History:  Procedure Laterality Date  . CARDIAC CATHETERIZATION  04/19/09  . CARDIAC CATHETERIZATION N/A 09/30/2015   Procedure: Left Heart Cath and Coronary Angiography;  Surgeon: Larey Dresser, MD;  Location: [facility];  Service: Cardiovascular;  Laterality: N/A;  . CHOLECYSTECTOMY    . remote hernia repair      ROS- all systems are reviewed and negatives except as per HPI above  Current Outpatient Medications  Medication Sig Dispense Refill  . acetaminophen (TYLENOL) 500 MG tablet Take 500 mg by mouth every 6 (six) hours as needed for moderate pain.    [name] aspirin 81 MG tablet Take 81 mg by mouth daily.      . carvedilol (COREG) 25 MG tablet Take 1 tablet (25 mg total) by mouth 2 (two)  times daily. 180 tablet 3  . colchicine 0.6 MG tablet Take 0.6 mg by mouth daily as needed. As directed    . cyclobenzaprine (FLEXERIL) 10 MG tablet Take 1 tablet (10 mg total) by mouth every 8 (eight) hours as needed for muscle spasms. 90 tablet 6  . enalapril (VASOTEC) 10 MG tablet Take 1 tablet (10 mg total) by mouth every evening. 90 tablet 3  . furosemide (LASIX) 20 MG tablet Take 1 tablet (20 mg total) by mouth 3 (three) times a week. 45 tablet 2  . glucose blood (FREESTYLE TEST STRIPS) test strip Use to check blood sugars twice a day 300 each 1  . lovastatin (MEVACOR) 10 MG tablet Take 1 tablet (10 mg total) by mouth at bedtime. 90 tablet 3  . meloxicam (MOBIC) 15 MG tablet Take 1 tablet (15 mg total) by mouth daily. 90 tablet 2  . methimazole (TAPAZOLE) 5 MG tablet Take 1 tablet (5 mg total) by mouth 4 (four) times a week. 50 tablet 2  . Multiple Vitamin (MULTIVITAMIN) tablet Take 1 tablet by mouth daily.      . ondansetron (ZOFRAN) 8 MG tablet Take 1 tablet (8 mg total) by mouth every 8 (eight) hours as needed for nausea or vomiting. 90 tablet 3  . traMADol (ULTRAM) 50 MG tablet Take 1 tablet (50 mg total) by mouth daily as needed (pain). Needs visit for any refills 30 tablet 0  No current facility-administered medications for this visit.     Physical Exam: Vitals:   07/25/17 1519  BP: 124/74  Pulse: 67  Weight: 296 lb (134.3 kg)  Height: 5\' 7"  (1.702 m)    GEN- The patient is overweight appearing, alert and oriented x 3 today.   Head- normocephalic, atraumatic Eyes-  Sclera clear, conjunctiva pink Ears- hearing intact Oropharynx- clear Lungs- Clear to ausculation bilaterally, normal work of breathing Heart- Regular rate and rhythm, no murmurs, rubs or gallops, PMI not laterally displaced GI- soft, NT, ND, + BS Extremities- no clubbing, cyanosis, or edema  Wt Readings from Last 3 Encounters:  07/25/17 296 lb (134.3 kg)  05/09/17 286 lb 3.2 oz (129.8 kg)  01/24/17 291  lb 3.2 oz (132.1 kg)    EKG tracing ordered today is personally reviewed and shows sinus rhythm, no PVCs  Assessment and Plan:  1. PVCs Well controlled No further management planned  2. Obesity Body mass index is 46.36 kg/m. Wt Readings from Last 3 Encounters:  07/25/17 296 lb (134.3 kg)  05/09/17 286 lb 3.2 oz (129.8 kg)  01/24/17 291 lb 3.2 oz (132.1 kg)  lifestyle modification encouraged  3. HTN Stable No change required today bmet today  Follow-up with primary care EP APP to see yearly I will see when needed  Thompson Grayer MD, [facility] 07/25/2017 4:16 PM

## 2017-07-26 LAB — BASIC METABOLIC PANEL
BUN / CREAT RATIO: 17 (ref 12–28)
BUN: 25 mg/dL (ref 8–27)
CALCIUM: 8.6 mg/dL — AB (ref 8.7–10.3)
CO2: 23 mmol/L (ref 20–29)
Chloride: 106 mmol/L (ref 96–106)
Creatinine, Ser: 1.45 mg/dL — ABNORMAL HIGH (ref 0.57–1.00)
GFR, EST AFRICAN AMERICAN: 43 mL/min/{1.73_m2} — AB (ref >59)
GFR, EST NON AFRICAN AMERICAN: 37 mL/min/{1.73_m2} — AB (ref >59)
Glucose: 86 mg/dL (ref 65–99)
POTASSIUM: 5.2 mmol/L (ref 3.5–5.2)
SODIUM: 145 mmol/L — AB (ref 134–144)

## 2017-07-27 ENCOUNTER — Encounter: Payer: Self-pay | Admitting: Internal Medicine

## 2017-08-02 ENCOUNTER — Other Ambulatory Visit: Payer: Self-pay

## 2017-08-02 DIAGNOSIS — I5032 Chronic diastolic (congestive) heart failure: Secondary | ICD-10-CM

## 2017-08-02 DIAGNOSIS — I493 Ventricular premature depolarization: Secondary | ICD-10-CM

## 2017-08-02 MED ORDER — ENALAPRIL MALEATE 10 MG PO TABS: 10.0000 mg | ORAL_TABLET | Freq: Every evening | ORAL | 3 refills | Status: DC

## 2017-08-06 ENCOUNTER — Ambulatory Visit: Payer: Medicare Other | Admitting: Internal Medicine

## 2017-08-06 ENCOUNTER — Ambulatory Visit: Payer: Medicare Other | Admitting: Endocrinology

## 2017-08-14 ENCOUNTER — Ambulatory Visit (INDEPENDENT_AMBULATORY_CARE_PROVIDER_SITE_OTHER): Payer: Medicare Other | Admitting: Internal Medicine

## 2017-08-14 ENCOUNTER — Encounter: Payer: Self-pay | Admitting: Internal Medicine

## 2017-08-14 ENCOUNTER — Other Ambulatory Visit (INDEPENDENT_AMBULATORY_CARE_PROVIDER_SITE_OTHER): Payer: Medicare Other

## 2017-08-14 VITALS — BP 112/64 | HR 68 | Temp 97.5°F | Ht 67.0 in | Wt 294.0 lb

## 2017-08-14 DIAGNOSIS — E118 Type 2 diabetes mellitus with unspecified complications: Secondary | ICD-10-CM

## 2017-08-14 DIAGNOSIS — E1169 Type 2 diabetes mellitus with other specified complication: Secondary | ICD-10-CM | POA: Diagnosis not present

## 2017-08-14 DIAGNOSIS — Z1159 Encounter for screening for other viral diseases: Secondary | ICD-10-CM

## 2017-08-14 DIAGNOSIS — M1712 Unilateral primary osteoarthritis, left knee: Secondary | ICD-10-CM | POA: Diagnosis not present

## 2017-08-14 DIAGNOSIS — M1711 Unilateral primary osteoarthritis, right knee: Secondary | ICD-10-CM | POA: Diagnosis not present

## 2017-08-14 DIAGNOSIS — E785 Hyperlipidemia, unspecified: Secondary | ICD-10-CM | POA: Diagnosis not present

## 2017-08-14 LAB — COMPREHENSIVE METABOLIC PANEL
ALT: 9 U/L (ref 0–35)
AST: 10 U/L (ref 0–37)
Albumin: 4 g/dL (ref 3.5–5.2)
Alkaline Phosphatase: 56 U/L (ref 39–117)
BILIRUBIN TOTAL: 0.6 mg/dL (ref 0.2–1.2)
BUN: 25 mg/dL — ABNORMAL HIGH (ref 6–23)
CHLORIDE: 105 meq/L (ref 96–112)
CO2: 29 meq/L (ref 19–32)
CREATININE: 1.26 mg/dL — AB (ref 0.40–1.20)
Calcium: 9.1 mg/dL (ref 8.4–10.5)
GFR: 44.9 mL/min — ABNORMAL LOW (ref >60.00)
GLUCOSE: 110 mg/dL — AB (ref 70–99)
Potassium: 5.2 mEq/L — ABNORMAL HIGH (ref 3.5–5.1)
Sodium: 140 mEq/L (ref 135–145)
Total Protein: 7.3 g/dL (ref 6.0–8.3)

## 2017-08-14 LAB — LIPID PANEL
CHOL/HDL RATIO: 2
Cholesterol: 128 mg/dL (ref 0–200)
HDL: 53.9 mg/dL (ref >39.00)
LDL Cholesterol: 57 mg/dL (ref 0–99)
NonHDL: 74.01
TRIGLYCERIDES: 83 mg/dL (ref 0.0–149.0)
VLDL: 16.6 mg/dL (ref 0.0–40.0)

## 2017-08-14 LAB — CBC
HCT: 38.3 % (ref 36.0–46.0)
Hemoglobin: 12.8 g/dL (ref 12.0–15.0)
MCHC: 33.5 g/dL (ref 30.0–36.0)
MCV: 88.5 fl (ref 78.0–100.0)
Platelets: 286 10*3/uL (ref 150.0–400.0)
RBC: 4.33 Mil/uL (ref 3.87–5.11)
RDW: 14.2 % (ref 11.5–15.5)
WBC: 8.2 10*3/uL (ref 4.0–10.5)

## 2017-08-14 MED ORDER — GLUCOSE BLOOD VI STRP: ORAL_STRIP | 1 refills | Status: AC

## 2017-08-14 MED ORDER — ONDANSETRON HCL 8 MG PO TABS: 8.0000 mg | ORAL_TABLET | Freq: Three times a day (TID) | ORAL | 3 refills | Status: DC | PRN

## 2017-08-14 MED ORDER — CYCLOBENZAPRINE HCL 10 MG PO TABS: 10.0000 mg | ORAL_TABLET | Freq: Three times a day (TID) | ORAL | 6 refills | Status: DC | PRN

## 2017-08-14 MED ORDER — MELOXICAM 15 MG PO TABS: 15.0000 mg | ORAL_TABLET | Freq: Every day | ORAL | 2 refills | Status: DC

## 2017-08-14 MED ORDER — TRAMADOL HCL 50 MG PO TABS: 50.0000 mg | ORAL_TABLET | Freq: Every day | ORAL | 5 refills | Status: DC | PRN

## 2017-08-14 NOTE — Progress Notes (Signed)
   Subjective:    Patient ID: Katie Higgins, female    DOB: [birth year], 67 y.o.   MRN: [id number]  HPI The patient is a 67 YO female coming in for follow up diabetes (diet controlled currently, needs monitoring for progression, denies symptoms such as excessive thirst or change in urination, denies new numbness or tingling in hands or feet, has yearly eye exam, is on statin, has some stable neuropathy), cholesterol (taking lovastatin, LDL goal <70, concurrent diabetes, denies muscle aches) and arthritis pain (taking meloxicam which is helpful but needs refills, does know not to take with any other ibuprofen, naproxen, aleve, advil, etc, she denies new injury, she is also using 1-2 tramadol at night time for pain to help her sleep, she has been evaluated by orthopedics).   Review of Systems  Constitutional: Negative.   HENT: Negative.   Eyes: Negative.   Respiratory: Negative for cough, chest tightness and shortness of breath.   Cardiovascular: Negative for chest pain, palpitations and leg swelling.  Gastrointestinal: Negative for abdominal distention, abdominal pain, constipation, diarrhea, nausea and vomiting.  Musculoskeletal: Positive for arthralgias.  Skin: Negative.   Neurological: Negative.   Psychiatric/Behavioral: Negative.       Objective:   Physical Exam  Constitutional: She is oriented to person, place, and time. She appears well-developed and well-nourished.  HENT:  Head: Normocephalic and atraumatic.  Eyes: EOM are normal.  Neck: Normal range of motion.  Cardiovascular: Normal rate and regular rhythm.  Pulmonary/Chest: Effort normal and breath sounds normal. No respiratory distress. She has no wheezes. She has no rales.  Abdominal: Soft. Bowel sounds are normal. She exhibits no distension. There is no tenderness. There is no rebound.  Musculoskeletal: She exhibits tenderness. She exhibits no edema.  Neurological: She is alert and oriented to person, place, and time.  Coordination normal.  Skin: Skin is warm and dry.  Foot exam done  Psychiatric: She has a normal mood and affect.   Vitals:   08/14/17 1410  BP: 112/64  Pulse: 68  Temp: (!) 97.5 F (36.4 C)  TempSrc: Oral  SpO2: 97%  Weight: 294 lb (133.4 kg)  Height: 5\' 7"  (1.702 m)      Assessment & Plan:

## 2017-08-14 NOTE — Patient Instructions (Signed)
We have sent in the refills including the tramadol which you can take up to 2 pills per day.  Health Maintenance, Female Adopting a healthy lifestyle and getting preventive care can go a long way to promote health and wellness. Talk with your health care provider about what schedule of regular examinations is right for you. This is a good chance for you to check in with your provider about disease prevention and staying healthy. In between checkups, there are plenty of things you can do on your own. Experts have done a lot of research about which lifestyle changes and preventive measures are most likely to keep you healthy. Ask your health care provider for more information. Weight and diet Eat a healthy diet  Be sure to include plenty of vegetables, fruits, low-fat dairy products, and lean protein.  Do not eat a lot of foods high in solid fats, added sugars, or salt.  Get regular exercise. This is one of the most important things you can do for your health. ? Most adults should exercise for at least 150 minutes each week. The exercise should increase your heart rate and make you sweat (moderate-intensity exercise). ? Most adults should also do strengthening exercises at least twice a week. This is in addition to the moderate-intensity exercise.  Maintain a healthy weight  Body mass index (BMI) is a measurement that can be used to identify possible weight problems. It estimates body fat based on height and weight. Your health care provider can help determine your BMI and help you achieve or maintain a healthy weight.  For females 55 years of age and older: ? A BMI below 18.5 is considered underweight. ? A BMI of 18.5 to 24.9 is normal. ? A BMI of 25 to 29.9 is considered overweight. ? A BMI of 30 and above is considered obese.  Watch levels of cholesterol and blood lipids  You should start having your blood tested for lipids and cholesterol at 68 years of age, then have this test every 5  years.  You may need to have your cholesterol levels checked more often if: ? Your lipid or cholesterol levels are high. ? You are older than 68 years of age. ? You are at high risk for heart disease.  Cancer screening Lung Cancer  Lung cancer screening is recommended for adults 67-31 years old who are at high risk for lung cancer because of a history of smoking.  A yearly low-dose CT scan of the lungs is recommended for people who: ? Currently smoke. ? Have quit within the past 15 years. ? Have at least a 30-pack-year history of smoking. A pack year is smoking an average of one pack of cigarettes a day for 1 year.  Yearly screening should continue until it has been 15 years since you quit.  Yearly screening should stop if you develop a health problem that would prevent you from having lung cancer treatment.  Breast Cancer  Practice breast self-awareness. This means understanding how your breasts normally appear and feel.  It also means doing regular breast self-exams. Let your health care provider know about any changes, no matter how small.  If you are in your 20s or 30s, you should have a clinical breast exam (CBE) by a health care provider every 1-3 years as part of a regular health exam.  If you are 31 or older, have a CBE every year. Also consider having a breast X-ray (mammogram) every year.  If you have a family  history of breast cancer, talk to your health care provider about genetic screening.  If you are at high risk for breast cancer, talk to your health care provider about having an MRI and a mammogram every year.  Breast cancer gene (BRCA) assessment is recommended for women who have family members with BRCA-related cancers. BRCA-related cancers include: ? Breast. ? Ovarian. ? Tubal. ? Peritoneal cancers.  Results of the assessment will determine the need for genetic counseling and BRCA1 and BRCA2 testing.  Cervical Cancer Your health care provider may  recommend that you be screened regularly for cancer of the pelvic organs (ovaries, uterus, and vagina). This screening involves a pelvic examination, including checking for microscopic changes to the surface of your cervix (Pap test). You may be encouraged to have this screening done every 3 years, beginning at age 83.  For women ages 16-65, health care providers may recommend pelvic exams and Pap testing every 3 years, or they may recommend the Pap and pelvic exam, combined with testing for human papilloma virus (HPV), every 5 years. Some types of HPV increase your risk of cervical cancer. Testing for HPV may also be done on women of any age with unclear Pap test results.  Other health care providers may not recommend any screening for nonpregnant women who are considered low risk for pelvic cancer and who do not have symptoms. Ask your health care provider if a screening pelvic exam is right for you.  If you have had past treatment for cervical cancer or a condition that could lead to cancer, you need Pap tests and screening for cancer for at least 20 years after your treatment. If Pap tests have been discontinued, your risk factors (such as having a new sexual partner) need to be reassessed to determine if screening should resume. Some women have medical problems that increase the chance of getting cervical cancer. In these cases, your health care provider may recommend more frequent screening and Pap tests.  Colorectal Cancer  This type of cancer can be detected and often prevented.  Routine colorectal cancer screening usually begins at 68 years of age and continues through 68 years of age.  Your health care provider may recommend screening at an earlier age if you have risk factors for colon cancer.  Your health care provider may also recommend using home test kits to check for hidden blood in the stool.  A small camera at the end of a tube can be used to examine your colon directly  (sigmoidoscopy or colonoscopy). This is done to check for the earliest forms of colorectal cancer.  Routine screening usually begins at age 43.  Direct examination of the colon should be repeated every 5-10 years through 68 years of age. However, you may need to be screened more often if early forms of precancerous polyps or small growths are found.  Skin Cancer  Check your skin from head to toe regularly.  Tell your health care provider about any new moles or changes in moles, especially if there is a change in a mole's shape or color.  Also tell your health care provider if you have a mole that is larger than the size of a pencil eraser.  Always use sunscreen. Apply sunscreen liberally and repeatedly throughout the day.  Protect yourself by wearing long sleeves, pants, a wide-brimmed hat, and sunglasses whenever you are outside.  Heart disease, diabetes, and high blood pressure  High blood pressure causes heart disease and increases the risk of stroke.  High blood pressure is more likely to develop in: ? People who have blood pressure in the high end of the normal range (130-139/85-89 mm Hg). ? People who are overweight or obese. ? People who are African American.  If you are 48-73 years of age, have your blood pressure checked every 3-5 years. If you are 45 years of age or older, have your blood pressure checked every year. You should have your blood pressure measured twice-once when you are at a hospital or clinic, and once when you are not at a hospital or clinic. Record the average of the two measurements. To check your blood pressure when you are not at a hospital or clinic, you can use: ? An automated blood pressure machine at a pharmacy. ? A home blood pressure monitor.  If you are between 11 years and 45 years old, ask your health care provider if you should take aspirin to prevent strokes.  Have regular diabetes screenings. This involves taking a blood sample to check your  fasting blood sugar level. ? If you are at a normal weight and have a low risk for diabetes, have this test once every three years after 68 years of age. ? If you are overweight and have a high risk for diabetes, consider being tested at a younger age or more often. Preventing infection Hepatitis B  If you have a higher risk for hepatitis B, you should be screened for this virus. You are considered at high risk for hepatitis B if: ? You were born in a country where hepatitis B is common. Ask your health care provider which countries are considered high risk. ? Your parents were born in a high-risk country, and you have not been immunized against hepatitis B (hepatitis B vaccine). ? You have HIV or AIDS. ? You use needles to inject street drugs. ? You live with someone who has hepatitis B. ? You have had sex with someone who has hepatitis B. ? You get hemodialysis treatment. ? You take certain medicines for conditions, including cancer, organ transplantation, and autoimmune conditions.  Hepatitis C  Blood testing is recommended for: ? Everyone born from [birth year] through 1966. ? Anyone with known risk factors for hepatitis C.  Sexually transmitted infections (STIs)  You should be screened for sexually transmitted infections (STIs) including gonorrhea and chlamydia if: ? You are sexually active and are younger than 68 years of age. ? You are older than 68 years of age and your health care provider tells you that you are at risk for this type of infection. ? Your sexual activity has changed since you were last screened and you are at an increased risk for chlamydia or gonorrhea. Ask your health care provider if you are at risk.  If you do not have HIV, but are at risk, it may be recommended that you take a prescription medicine daily to prevent HIV infection. This is called pre-exposure prophylaxis (PrEP). You are considered at risk if: ? You are sexually active and do not regularly use condoms  or know the HIV status of your partner(s). ? You take drugs by injection. ? You are sexually active with a partner who has HIV.  Talk with your health care provider about whether you are at high risk of being infected with HIV. If you choose to begin PrEP, you should first be tested for HIV. You should then be tested every 3 months for as long as you are taking PrEP. Pregnancy  If you  are premenopausal and you may become pregnant, ask your health care provider about preconception counseling.  If you may become pregnant, take 400 to 800 micrograms (mcg) of folic acid every day.  If you want to prevent pregnancy, talk to your health care provider about birth control (contraception). Osteoporosis and menopause  Osteoporosis is a disease in which the bones lose minerals and strength with aging. This can result in serious bone fractures. Your risk for osteoporosis can be identified using a bone density scan.  If you are 76 years of age or older, or if you are at risk for osteoporosis and fractures, ask your health care provider if you should be screened.  Ask your health care provider whether you should take a calcium or vitamin D supplement to lower your risk for osteoporosis.  Menopause may have certain physical symptoms and risks.  Hormone replacement therapy may reduce some of these symptoms and risks. Talk to your health care provider about whether hormone replacement therapy is right for you. Follow these instructions at home:  Schedule regular health, dental, and eye exams.  Stay current with your immunizations.  Do not use any tobacco products including cigarettes, chewing tobacco, or electronic cigarettes.  If you are pregnant, do not drink alcohol.  If you are breastfeeding, limit how much and how often you drink alcohol.  Limit alcohol intake to no more than 1 drink per day for nonpregnant women. One drink equals 12 ounces of beer, 5 ounces of wine, or 1 ounces of hard  liquor.  Do not use street drugs.  Do not share needles.  Ask your health care provider for help if you need support or information about quitting drugs.  Tell your health care provider if you often feel depressed.  Tell your health care provider if you have ever been abused or do not feel safe at home. This information is not intended to replace advice given to you by your health care provider. Make sure you discuss any questions you have with your health care provider. Document Released: 08/08/2010 Document Revised: 07/01/2015 Document Reviewed: 10/27/2014 Elsevier Interactive Patient Education  [employer].

## 2017-08-15 LAB — HEPATITIS C ANTIBODY
HEP C AB: NONREACTIVE
SIGNAL TO CUT-OFF: 0.01 (ref <1.00)

## 2017-08-17 NOTE — Assessment & Plan Note (Signed)
Taking tramadol and will allow 2 pills at night time if needed. [place] narcotic database reviewed and no inappropriate fills.

## 2017-08-17 NOTE — Assessment & Plan Note (Addendum)
Foot exam done, stable neuropathy, diet controlled, checking HgA1c today and reminded about yearly eye exam. She is on statin and ACE-I. Adjust as needed.

## 2017-08-17 NOTE — Assessment & Plan Note (Signed)
Taking lovastatin 10 mg daily, checking lipid panel and adjust as needed for LDL <70.

## 2017-08-17 NOTE — Assessment & Plan Note (Signed)
Taking tramadol and will allow 2 pills at night time. Meloxicam refilled for during the day. Will check CMP for renal function to ensure no side effects of NSAIDs.

## 2017-11-09 ENCOUNTER — Ambulatory Visit: Payer: Medicare Other | Admitting: Endocrinology

## 2017-11-15 ENCOUNTER — Ambulatory Visit (INDEPENDENT_AMBULATORY_CARE_PROVIDER_SITE_OTHER): Payer: Medicare Other | Admitting: Endocrinology

## 2017-11-15 ENCOUNTER — Encounter: Payer: Self-pay | Admitting: Endocrinology

## 2017-11-15 VITALS — BP 122/78 | HR 74 | Ht 67.0 in | Wt 286.6 lb

## 2017-11-15 DIAGNOSIS — Z23 Encounter for immunization: Secondary | ICD-10-CM

## 2017-11-15 DIAGNOSIS — E0580 Other thyrotoxicosis without thyrotoxic crisis or storm: Secondary | ICD-10-CM | POA: Diagnosis not present

## 2017-11-15 LAB — T4, FREE: Free T4: 0.99 ng/dL (ref 0.60–1.60)

## 2017-11-15 LAB — TSH: TSH: 2.02 u[IU]/mL (ref 0.35–4.50)

## 2017-11-15 MED ORDER — METHIMAZOLE 5 MG PO TABS: 5.0000 mg | ORAL_TABLET | ORAL | 2 refills | Status: DC

## 2017-11-15 NOTE — Progress Notes (Signed)
Subjective:    Patient ID: Katie Higgins, female    DOB: 1950-07-01, 67 y.o.   MRN: [id number]  HPI Pt returns for f/u of hyperthyroidism (due to [place] dz; dx'ed 2010; US showed heterogeneity of texture and one small nodule; she has been unable to safely d/c tapazole for RAI rx, due to CHF).  pt states she feels well in general, except for sciatica.  Specifically, she denies palpitations, tremor, and anxiety.  She takes tapazole as rx'ed.   Past Medical History:  Diagnosis Date  . ALLERGIC RHINITIS   . CARDIOMYOPATHY    Nonischemic. Admitted in 3/11 with CHF exac, due to hyperthyroidism. LHC (03/11) showed clean coronaries with EF 40%. Myoview showed EF 38%. Echo was a technically difficult study with mild to moderately decreased EF, mild LVH, mild MR. Repeat echo (9/11) with EF 78-29%, mild diastolic dysfunction (grade I).  . CARPAL TUNNEL SYNDROME, LEFT, MILD   . CKD (chronic kidney disease), stage III ([place])   . Congestive heart failure, unspecified   . DIABETES MELLITUS, CONTROLLED   . DYSLIPIDEMIA   . Graves disease   . HYPERTENSION   . HYPERTHYROIDISM   . Obesity   . Osteoarthritis    esp L hip, low back  . Palpitations   . Premature ventricular contraction     Past Surgical History:  Procedure Laterality Date  . CARDIAC CATHETERIZATION  04/19/09  . CARDIAC CATHETERIZATION N/A 09/30/2015   Procedure: Left Heart Cath and Coronary Angiography;  Surgeon: Larey Dresser, MD;  Location: [facility];  Service: Cardiovascular;  Laterality: N/A;  . CHOLECYSTECTOMY    . remote hernia repair      Social History   Socioeconomic History  . Marital status: Widowed    Spouse name: Not on file  . Number of children: Not on file  . Years of education: Not on file  . Highest education level: Not on file  Occupational History  . Not on file  Social Needs  . Financial resource strain: Not on file  . Food insecurity:    Worry: Not on file    Inability: Not on file  .  Transportation needs:    Medical: Not on file    Non-medical: Not on file  Tobacco Use  . Smoking status: Never Smoker  . Smokeless tobacco: Never Used  Substance and Sexual Activity  . Alcohol use: No  . Drug use: No  . Sexual activity: Not on file  Lifestyle  . Physical activity:    Days per week: Not on file    Minutes per session: Not on file  . Stress: Not on file  Relationships  . Social connections:    Talks on phone: Not on file    Gets together: Not on file    Attends religious service: Not on file    Active member of club or organization: Not on file    Attends meetings of clubs or organizations: Not on file    Relationship status: Not on file  . Intimate partner violence:    Fear of current or ex partner: Not on file    Emotionally abused: Not on file    Physically abused: Not on file    Forced sexual activity: Not on file  Other Topics Concern  . Not on file  Social History Narrative   Lives in [place] with her son.   Widowed since 1992.   She is very sedentary, uses a wheelchair when out of the  house.   Ambulatory in home with cane.    Current Outpatient Medications on File Prior to Visit  Medication Sig Dispense Refill  . acetaminophen (TYLENOL) 500 MG tablet Take 500 mg by mouth every 6 (six) hours as needed for moderate pain.    [name] aspirin 81 MG tablet Take 81 mg by mouth daily.      . carvedilol (COREG) 25 MG tablet Take 1 tablet (25 mg total) by mouth 2 (two) times daily. 180 tablet 3  . colchicine 0.6 MG tablet Take 0.6 mg by mouth daily as needed. As directed    . cyclobenzaprine (FLEXERIL) 10 MG tablet Take 1 tablet (10 mg total) by mouth every 8 (eight) hours as needed for muscle spasms. 90 tablet 6  . enalapril (VASOTEC) 10 MG tablet Take 1 tablet (10 mg total) by mouth every evening. 90 tablet 3  . furosemide (LASIX) 20 MG tablet Take 1 tablet (20 mg total) by mouth 3 (three) times a week. 45 tablet 2  . glucose blood (FREESTYLE TEST STRIPS)  test strip Use to check blood sugars twice a day 300 each 1  . lovastatin (MEVACOR) 10 MG tablet Take 1 tablet (10 mg total) by mouth at bedtime. 90 tablet 3  . meloxicam (MOBIC) 15 MG tablet Take 1 tablet (15 mg total) by mouth daily. 90 tablet 2  . Multiple Vitamin (MULTIVITAMIN) tablet Take 1 tablet by mouth daily.      . ondansetron (ZOFRAN) 8 MG tablet Take 1 tablet (8 mg total) by mouth every 8 (eight) hours as needed for nausea or vomiting. 90 tablet 3  . traMADol (ULTRAM) 50 MG tablet Take 1-2 tablets (50-100 mg total) by mouth daily as needed (pain). Needs visit for any refills 60 tablet 5   No current facility-administered medications on file prior to visit.     Allergies  Allergen Reactions  . Latex Rash  . Wool Alcohol [Lanolin] Rash  . Amitriptyline Other (See Comments)    Leg cramps, trimmers     Family History  Problem Relation Age of Onset  . Diabetes Mother   . Breast cancer Mother   . Cervical cancer Mother   . Kidney cancer Father        Kidney Cancer  . Stroke Father        CVA  . Supraventricular tachycardia Sister   . Asthma Unknown        family history    BP 122/78 (BP Location: Right Arm, Patient Position: Sitting)   Pulse 74   Ht 5\' 7"  (1.702 m)   Wt 286 lb 9.6 oz (130 kg)   SpO2 97%   BMI 44.89 kg/m    Review of Systems Denies fever.      Objective:   Physical Exam VITAL SIGNS:  See vs page GENERAL: no distress.  In wheelchair NECK: There is no palpable thyroid enlargement.  No thyroid nodule is palpable.  No palpable lymphadenopathy at the anterior neck.  Lab Results  Component Value Date   CREATININE 1.26 (H) 08/14/2017   BUN 25 (H) 08/14/2017   NA 140 08/14/2017   K 5.2 (H) 08/14/2017   CL 105 08/14/2017   CO2 29 08/14/2017   Lab Results  Component Value Date   TSH 2.02 11/15/2017      Assessment & Plan:  Hyperthyroidism: well-controlled CHF: in this context, she needs to maintain euthyroidism  Patient Instructions    Thyroid blood tests are requested for you today.  We'll  let you know about the results.  Please come back for a follow-up appointment in 6 months.  If ever you have fever while taking methimazole, stop it and call us, even if the reason is obvious, because of the risk of a rare side-effect.

## 2017-11-15 NOTE — Patient Instructions (Signed)
Thyroid blood tests are requested for you today.  We'll let you know about the results.   Please come back for a follow-up appointment in 6 months.   If ever you have fever while taking methimazole, stop it and call us, even if the reason is obvious, because of the risk of a rare side-effect.    

## 2018-01-11 ENCOUNTER — Ambulatory Visit: Payer: Self-pay

## 2018-01-11 ENCOUNTER — Ambulatory Visit (INDEPENDENT_AMBULATORY_CARE_PROVIDER_SITE_OTHER): Payer: Medicare Other | Admitting: Family Medicine

## 2018-01-11 ENCOUNTER — Encounter: Payer: Self-pay | Admitting: Family Medicine

## 2018-01-11 ENCOUNTER — Ambulatory Visit (INDEPENDENT_AMBULATORY_CARE_PROVIDER_SITE_OTHER)
Admission: RE | Admit: 2018-01-11 | Discharge: 2018-01-11 | Disposition: A | Discharge status: Home / Self Care | Payer: Medicare Other | Source: Ambulatory Visit | Attending: Family Medicine | Admitting: Family Medicine

## 2018-01-11 VITALS — BP 142/86 | HR 73 | Resp 16

## 2018-01-11 DIAGNOSIS — M25561 Pain in right knee: Secondary | ICD-10-CM | POA: Diagnosis not present

## 2018-01-11 DIAGNOSIS — M1711 Unilateral primary osteoarthritis, right knee: Secondary | ICD-10-CM | POA: Diagnosis not present

## 2018-01-11 NOTE — Progress Notes (Signed)
Katie Higgins - 67 y.o. female MRN [id number]  Date of birth: 09-10-1950  SUBJECTIVE:  Including CC & ROS.  No chief complaint on file.   Katie Higgins is a 67 y.o. female that is presenting with acute on chronic right knee pain.  Pain is occurring over the lateral joint line.  Having some swelling with it.  She denies any inciting event.  She has had similar pain in the past.  She received joint injections.  The last one being a couple years ago.  She uses a wheelchair most the time to get around.  She denies any mechanical symptoms.  The pain is moderate to severe.  Pain is localized to the knee.  Pain is worse with certain movements..  Independent review of the right knee x-ray from 2015 shows advanced tricompartmental degenerative changes.   Review of Systems  Constitutional: Negative for fever.  HENT: Negative for congestion.   Respiratory: Negative for cough.   Cardiovascular: Negative for chest pain.  Gastrointestinal: Negative for abdominal pain.  Musculoskeletal: Positive for arthralgias and gait problem.  Skin: Negative for color change.  Neurological: Negative for weakness.  Hematological: Negative for adenopathy.  Psychiatric/Behavioral: Negative for agitation.    HISTORY: Past Medical, Surgical, Social, and Family History Reviewed & Updated per EMR.   Pertinent Historical Findings include:  Past Medical History:  Diagnosis Date  . ALLERGIC RHINITIS   . CARDIOMYOPATHY    Nonischemic. Admitted in 3/11 with CHF exac, due to hyperthyroidism. LHC (03/11) showed clean coronaries with EF 40%. Myoview showed EF 38%. Echo was a technically difficult study with mild to moderately decreased EF, mild LVH, mild MR. Repeat echo (9/11) with EF 97-41%, mild diastolic dysfunction (grade I).  . CARPAL TUNNEL SYNDROME, LEFT, MILD   . CKD (chronic kidney disease), stage III ([place])   . Congestive heart failure, unspecified   . DIABETES MELLITUS, CONTROLLED   . DYSLIPIDEMIA   .  Graves disease   . HYPERTENSION   . HYPERTHYROIDISM   . Obesity   . Osteoarthritis    esp L hip, low back  . Palpitations   . Premature ventricular contraction     Past Surgical History:  Procedure Laterality Date  . CARDIAC CATHETERIZATION  04/19/09  . CARDIAC CATHETERIZATION N/A 09/30/2015   Procedure: Left Heart Cath and Coronary Angiography;  Surgeon: Larey Dresser, MD;  Location: [facility];  Service: Cardiovascular;  Laterality: N/A;  . CHOLECYSTECTOMY    . remote hernia repair      Allergies  Allergen Reactions  . Latex Rash  . Wool Alcohol [Lanolin] Rash  . Amitriptyline Other (See Comments)    Leg cramps, trimmers     Family History  Problem Relation Age of Onset  . Diabetes Mother   . Breast cancer Mother   . Cervical cancer Mother   . Kidney cancer Father        Kidney Cancer  . Stroke Father        CVA  . Supraventricular tachycardia Sister   . Asthma Unknown        family history     Social History   Socioeconomic History  . Marital status: Widowed    Spouse name: Not on file  . Number of children: Not on file  . Years of education: Not on file  . Highest education level: Not on file  Occupational History  . Not on file  Social Needs  . Financial resource strain: Not on file  .  Food insecurity:    Worry: Not on file    Inability: Not on file  . Transportation needs:    Medical: Not on file    Non-medical: Not on file  Tobacco Use  . Smoking status: Never Smoker  . Smokeless tobacco: Never Used  Substance and Sexual Activity  . Alcohol use: No  . Drug use: No  . Sexual activity: Not on file  Lifestyle  . Physical activity:    Days per week: Not on file    Minutes per session: Not on file  . Stress: Not on file  Relationships  . Social connections:    Talks on phone: Not on file    Gets together: Not on file    Attends religious service: Not on file    Active member of club or organization: Not on file    Attends  meetings of clubs or organizations: Not on file    Relationship status: Not on file  . Intimate partner violence:    Fear of current or ex partner: Not on file    Emotionally abused: Not on file    Physically abused: Not on file    Forced sexual activity: Not on file  Other Topics Concern  . Not on file  Social History Narrative   Lives in [place] with her son.   Widowed since 1992.   She is very sedentary, uses a wheelchair when out of the house.   Ambulatory in home with cane.     PHYSICAL EXAM:  VS: BP (!) 142/86   Pulse 73   Resp 16   SpO2 98%  Physical Exam Gen: NAD, alert, cooperative with exam, well-appearing ENT: normal lips, normal nasal mucosa,  Eye: normal EOM, normal conjunctiva and lids CV:  no edema, +2 pedal pulses   Resp: no accessory muscle use, non-labored,  GI: no masses or tenderness, no hernia  Skin: no rashes, no areas of induration  Neuro: normal tone, normal sensation to touch Psych:  normal insight, alert and oriented MSK:  RIght Knee: Normal to inspection with no erythema or  obvious bony abnormalities. Small effusion  TTP along the medial and lateral joint line.  Palpation normal with no warmth patellar tenderness, or condyle tenderness. ROM full in flexion and extension and lower leg rotation. Ligaments with solid consistent endpoints including , LCL, MCL. Negative Mcmurray's tests. Non painful patellar compression. Patellar glide without crepitus. Patellar and quadriceps tendons unremarkable. Hamstring and quadriceps strength is normal.  Neurovascularly intact    Aspiration/Injection Procedure Note Katie Higgins 1950/10/29  Procedure: Injection Indications: Right knee pain  Procedure Details Consent: Risks of procedure as well as the alternatives and risks of each were explained to the (patient/caregiver).  Consent for procedure obtained. Time Out: Verified patient identification, verified procedure, site/side was marked,  verified correct patient position, special equipment/implants available, medications/allergies/relevent history reviewed, required imaging and test results available.  Performed.  The area was cleaned with iodine and alcohol swabs.    The right knee superior lateral suprapatellar pouch was injected using 1 cc's of 40 mg Kenalog and 4 cc's of 0.5% bupivacaine with a 22 1 1/2" needle.  Ultrasound was used. Images were obtained in Transverse and Long views showing the injection.    A sterile dressing was applied.  Patient did tolerate procedure well.        ASSESSMENT & PLAN:   Osteoarthritis of right knee Acute on chronic exacerbation of her right knee pain.  Previous injection lasted  some time. -Injection today. -Counseled on supportive care. -X-ray -If no improvement consider gel injections.

## 2018-01-11 NOTE — Patient Instructions (Signed)
Nice to meet you  Please try ice on the knee. 20 minutes at time and 3-4 times daily  I will call you with the results from today  Please follow up in 4-6 weeks if no better.

## 2018-01-14 NOTE — Assessment & Plan Note (Signed)
Acute on chronic exacerbation of her right knee pain.  Previous injection lasted some time. -Injection today. -Counseled on supportive care. -X-ray -If no improvement consider gel injections.

## 2018-01-21 ENCOUNTER — Encounter: Payer: Self-pay | Admitting: Internal Medicine

## 2018-01-21 MED ORDER — MELOXICAM 15 MG PO TABS: 15.0000 mg | ORAL_TABLET | Freq: Every day | ORAL | 1 refills | Status: DC

## 2018-01-23 ENCOUNTER — Encounter: Payer: Self-pay | Admitting: Internal Medicine

## 2018-01-23 DIAGNOSIS — M1711 Unilateral primary osteoarthritis, right knee: Secondary | ICD-10-CM

## 2018-01-23 DIAGNOSIS — M1712 Unilateral primary osteoarthritis, left knee: Secondary | ICD-10-CM

## 2018-01-24 MED ORDER — TRAMADOL HCL 50 MG PO TABS: 50.0000 mg | ORAL_TABLET | Freq: Every day | ORAL | 5 refills | Status: DC | PRN

## 2018-02-05 ENCOUNTER — Encounter: Payer: Self-pay | Admitting: Internal Medicine

## 2018-03-25 ENCOUNTER — Encounter: Payer: Self-pay | Admitting: Internal Medicine

## 2018-03-25 ENCOUNTER — Encounter: Payer: Self-pay | Admitting: Endocrinology

## 2018-03-25 ENCOUNTER — Encounter: Payer: Self-pay | Admitting: Family Medicine

## 2018-03-25 NOTE — Telephone Encounter (Signed)
Please advise 

## 2018-05-07 ENCOUNTER — Encounter: Payer: Self-pay | Admitting: Endocrinology

## 2018-05-14 ENCOUNTER — Encounter: Payer: Self-pay | Admitting: Internal Medicine

## 2018-05-21 ENCOUNTER — Ambulatory Visit (INDEPENDENT_AMBULATORY_CARE_PROVIDER_SITE_OTHER): Payer: Medicare Other | Admitting: Endocrinology

## 2018-05-21 ENCOUNTER — Other Ambulatory Visit: Payer: Self-pay

## 2018-05-21 ENCOUNTER — Encounter: Payer: Self-pay | Admitting: Endocrinology

## 2018-05-21 DIAGNOSIS — E0580 Other thyrotoxicosis without thyrotoxic crisis or storm: Secondary | ICD-10-CM | POA: Diagnosis not present

## 2018-05-21 NOTE — Patient Instructions (Addendum)
Thyroid blood tests are requested for you today.  We'll let you know about the results.   Please come back for a follow-up appointment in 6 months.   If ever you have fever while taking methimazole, stop it and call us, even if the reason is obvious, because of the risk of a rare side-effect.

## 2018-05-21 NOTE — Progress Notes (Addendum)
Subjective:    Patient ID: Katie Higgins, female    DOB: 02-21-1950, 68 y.o.   MRN: [id number]  HPI  telehealth visit today via doxy video visit.  Alternatives to telehealth are presented to this patient, and the patient agrees to the telehealth visit. Pt is advised of the cost of the visit, and agrees to this, also.   Patient is at home, and I am at the office.   Pt returns for f/u of hyperthyroidism (due to [place]; dx'ed 2010; US showed heterogeneity of texture and one small nodule; she has been unable to safely d/c tapazole for RAI rx, due to CHF).  pt states she feels well in general.  Specifically, she denies palpitations, tremor, and anxiety.  She takes tapazole as rx'ed.   Past Medical History:  Diagnosis Date  . ALLERGIC RHINITIS   . CARDIOMYOPATHY    Nonischemic. Admitted in 3/11 with CHF exac, due to hyperthyroidism. LHC (03/11) showed clean coronaries with EF 40%. Myoview showed EF 38%. Echo was a technically difficult study with mild to moderately decreased EF, mild LVH, mild MR. Repeat echo (9/11) with EF 47-09%, mild diastolic dysfunction (grade I).  . CARPAL TUNNEL SYNDROME, LEFT, MILD   . CKD (chronic kidney disease), stage III ([place])   . Congestive heart failure, unspecified   . DIABETES MELLITUS, CONTROLLED   . DYSLIPIDEMIA   . Graves disease   . HYPERTENSION   . HYPERTHYROIDISM   . Obesity   . Osteoarthritis    esp L hip, low back  . Palpitations   . Premature ventricular contraction     Past Surgical History:  Procedure Laterality Date  . CARDIAC CATHETERIZATION  04/19/09  . CARDIAC CATHETERIZATION N/A 09/30/2015   Procedure: Left Heart Cath and Coronary Angiography;  Surgeon: Larey Dresser, MD;  Location: [facility];  Service: Cardiovascular;  Laterality: N/A;  . CHOLECYSTECTOMY    . remote hernia repair      Social History   Socioeconomic History  . Marital status: Widowed    Spouse name: Not on file  . Number of children: Not on file   . Years of education: Not on file  . Highest education level: Not on file  Occupational History  . Not on file  Social Needs  . Financial resource strain: Not on file  . Food insecurity:    Worry: Not on file    Inability: Not on file  . Transportation needs:    Medical: Not on file    Non-medical: Not on file  Tobacco Use  . Smoking status: Never Smoker  . Smokeless tobacco: Never Used  Substance and Sexual Activity  . Alcohol use: No  . Drug use: No  . Sexual activity: Not on file  Lifestyle  . Physical activity:    Days per week: Not on file    Minutes per session: Not on file  . Stress: Not on file  Relationships  . Social connections:    Talks on phone: Not on file    Gets together: Not on file    Attends religious service: Not on file    Active member of club or organization: Not on file    Attends meetings of clubs or organizations: Not on file    Relationship status: Not on file  . Intimate partner violence:    Fear of current or ex partner: Not on file    Emotionally abused: Not on file    Physically abused: Not  on file    Forced sexual activity: Not on file  Other Topics Concern  . Not on file  Social History Narrative   Lives in [place] with her son.   Widowed since 1992.   She is very sedentary, uses a wheelchair when out of the house.   Ambulatory in home with cane.    Current Outpatient Medications on File Prior to Visit  Medication Sig Dispense Refill  . acetaminophen (TYLENOL) 500 MG tablet Take 500 mg by mouth every 6 (six) hours as needed for moderate pain.    [name] aspirin 81 MG tablet Take 81 mg by mouth daily.      . carvedilol (COREG) 25 MG tablet Take 1 tablet (25 mg total) by mouth 2 (two) times daily. 180 tablet 3  . colchicine 0.6 MG tablet Take 0.6 mg by mouth daily as needed. As directed    . cyclobenzaprine (FLEXERIL) 10 MG tablet Take 1 tablet (10 mg total) by mouth every 8 (eight) hours as needed for muscle spasms. 90 tablet 6  .  enalapril (VASOTEC) 10 MG tablet Take 1 tablet (10 mg total) by mouth every evening. 90 tablet 3  . furosemide (LASIX) 20 MG tablet Take 1 tablet (20 mg total) by mouth 3 (three) times a week. 45 tablet 2  . glucose blood (FREESTYLE TEST STRIPS) test strip Use to check blood sugars twice a day 300 each 1  . lovastatin (MEVACOR) 10 MG tablet Take 1 tablet (10 mg total) by mouth at bedtime. 90 tablet 3  . meloxicam (MOBIC) 15 MG tablet Take 1 tablet (15 mg total) by mouth daily. 90 tablet 1  . methimazole (TAPAZOLE) 5 MG tablet Take 1 tablet (5 mg total) by mouth 4 (four) times a week. 50 tablet 2  . Multiple Vitamin (MULTIVITAMIN) tablet Take 1 tablet by mouth daily.      . ondansetron (ZOFRAN) 8 MG tablet Take 1 tablet (8 mg total) by mouth every 8 (eight) hours as needed for nausea or vomiting. 90 tablet 3  . traMADol (ULTRAM) 50 MG tablet Take 1-2 tablets (50-100 mg total) by mouth daily as needed (pain). Needs visit for any refills 60 tablet 5   No current facility-administered medications on file prior to visit.     Allergies  Allergen Reactions  . Latex Rash  . Wool Alcohol [Lanolin] Rash  . Amitriptyline Other (See Comments)    Leg cramps, trimmers     Family History  Problem Relation Age of Onset  . Diabetes Mother   . Breast cancer Mother   . Cervical cancer Mother   . Kidney cancer Father        Kidney Cancer  . Stroke Father        CVA  . Supraventricular tachycardia Sister   . Asthma Unknown        family history      Review of Systems Denies fever    Objective:   Physical Exam      Assessment & Plan:  Hyperthyroidism: due for recheck CHF: in this setting, she needs to maintain euthyroidism.   Patient Instructions  Thyroid blood tests are requested for you today.  We'll let you know about the results.   Please come back for a follow-up appointment in 6 months.   If ever you have fever while taking methimazole, stop it and call us, even if the reason is  obvious, because of the risk of a rare side-effect.

## 2018-06-06 DIAGNOSIS — R0602 Shortness of breath: Secondary | ICD-10-CM

## 2018-06-06 DIAGNOSIS — I5032 Chronic diastolic (congestive) heart failure: Secondary | ICD-10-CM

## 2018-06-06 MED ORDER — LOVASTATIN 10 MG PO TABS: 10.0000 mg | ORAL_TABLET | Freq: Every day | ORAL | 3 refills | Status: DC

## 2018-06-06 MED ORDER — CARVEDILOL 25 MG PO TABS: 25.0000 mg | ORAL_TABLET | Freq: Two times a day (BID) | ORAL | 3 refills | Status: DC

## 2018-06-27 ENCOUNTER — Encounter: Payer: Self-pay | Admitting: Internal Medicine

## 2018-06-27 DIAGNOSIS — M1711 Unilateral primary osteoarthritis, right knee: Secondary | ICD-10-CM

## 2018-06-27 DIAGNOSIS — M1712 Unilateral primary osteoarthritis, left knee: Secondary | ICD-10-CM

## 2018-06-27 NOTE — Telephone Encounter (Signed)
Please call pharmacy for fill records as meds by mail is not in [place]. She should still have refill and not need until mid-June. If she is out she needs visit.

## 2018-06-28 ENCOUNTER — Encounter: Payer: Self-pay | Admitting: Internal Medicine

## 2018-06-28 MED ORDER — MELOXICAM 15 MG PO TABS: 15.0000 mg | ORAL_TABLET | Freq: Every day | ORAL | 1 refills | Status: DC

## 2018-06-28 MED ORDER — TRAMADOL HCL 50 MG PO TABS: 50.0000 mg | ORAL_TABLET | Freq: Every day | ORAL | 0 refills | Status: DC | PRN

## 2018-06-28 NOTE — Addendum Note (Signed)
Addended by: Pricilla Holm A on: 06/28/2018 12:24 PM   Modules accepted: Orders

## 2018-07-22 ENCOUNTER — Telehealth: Payer: Self-pay

## 2018-07-23 ENCOUNTER — Encounter: Payer: Self-pay | Admitting: Internal Medicine

## 2018-07-24 NOTE — Telephone Encounter (Signed)
Left message regarding appt on 07/25/18.

## 2018-07-25 ENCOUNTER — Ambulatory Visit (INDEPENDENT_AMBULATORY_CARE_PROVIDER_SITE_OTHER): Payer: Medicare Other | Admitting: Internal Medicine

## 2018-07-25 ENCOUNTER — Telehealth (INDEPENDENT_AMBULATORY_CARE_PROVIDER_SITE_OTHER): Payer: Medicare Other | Admitting: Internal Medicine

## 2018-07-25 ENCOUNTER — Encounter: Payer: Self-pay | Admitting: Internal Medicine

## 2018-07-25 VITALS — Ht 67.0 in | Wt 275.0 lb

## 2018-07-25 DIAGNOSIS — E118 Type 2 diabetes mellitus with unspecified complications: Secondary | ICD-10-CM | POA: Diagnosis not present

## 2018-07-25 DIAGNOSIS — I10 Essential (primary) hypertension: Secondary | ICD-10-CM

## 2018-07-25 DIAGNOSIS — E0580 Other thyrotoxicosis without thyrotoxic crisis or storm: Secondary | ICD-10-CM

## 2018-07-25 DIAGNOSIS — M1711 Unilateral primary osteoarthritis, right knee: Secondary | ICD-10-CM

## 2018-07-25 DIAGNOSIS — I493 Ventricular premature depolarization: Secondary | ICD-10-CM

## 2018-07-25 DIAGNOSIS — R52 Pain, unspecified: Secondary | ICD-10-CM | POA: Diagnosis not present

## 2018-07-25 DIAGNOSIS — E785 Hyperlipidemia, unspecified: Secondary | ICD-10-CM

## 2018-07-25 DIAGNOSIS — M1712 Unilateral primary osteoarthritis, left knee: Secondary | ICD-10-CM | POA: Diagnosis not present

## 2018-07-25 DIAGNOSIS — E1169 Type 2 diabetes mellitus with other specified complication: Secondary | ICD-10-CM

## 2018-07-25 MED ORDER — FUROSEMIDE 20 MG PO TABS: 20.0000 mg | ORAL_TABLET | ORAL | 3 refills | Status: DC

## 2018-07-25 NOTE — Progress Notes (Signed)
Virtual Visit via Video Note  I connected with Katie Higgins on 07/25/18 at  2:20 PM EDT by a video enabled telemedicine application and verified that I am speaking with the correct person using two identifiers.  The patient and the provider were at separate locations throughout the entire encounter.   I discussed the limitations of evaluation and management by telemedicine and the availability of in person appointments. The patient expressed understanding and agreed to proceed.  History of Present Illness: The patient is a 68 y.o. female with visit for follow up chronic pain (uses tramadol BID and having some worsening pain with the weather recently, denies taking too much, denies side effects such as constipation or drowsiness) and diabetes (controlled by diet currently, not exercising due to pandemic, denies numbness in feet or hands, denies significant weight change but maybe up a few pounds) and blood pressure (taking her coreg and enalapril and lasix, denies chest pains or headaches, denies side effects).  Observations/Objective: Appearance: normal, breathing appears normal, casual grooming, abdomen does not appear distended, throat normal, memory normal, mental status is A and O times 3  Assessment and Plan: See problem oriented charting  Follow Up Instructions: refill tramadol, checking labs and UDS  I discussed the assessment and treatment plan with the patient. The patient was provided an opportunity to ask questions and all were answered. The patient agreed with the plan and demonstrated an understanding of the instructions.   The patient was advised to call back or seek an in-person evaluation if the symptoms worsen or if the condition fails to improve as anticipated.  Hoyt Koch, MD

## 2018-07-25 NOTE — Progress Notes (Signed)
Electrophysiology TeleHealth Note   Due to national recommendations of social distancing due to COVID 19, an audio/video telehealth visit is felt to be most appropriate for this patient at this time.  See MyChart message from today for the patient's consent to telehealth for [facility].    Date:  07/25/2018   ID:  ARDELLE HALIBURTON, DOB 1950-08-16, MRN [id number]  Location: patient's home  Provider location:  [facility]  Evaluation Performed: Follow-up visit  PCP:  Hoyt Koch, MD   Electrophysiologist:  Dr [name]  Chief Complaint:  palpitations  History of Present Illness:    SORIYA WORSTER is a 68 y.o. female who presents via telehealth conferencing today.  Since last being seen in our clinic, the patient reports doing very well.  Today, she denies symptoms of palpitations, chest pain, shortness of breath,  lower extremity edema, dizziness, presyncope, or syncope.  The patient is otherwise without complaint today.  The patient denies symptoms of fevers, chills, cough, or new SOB worrisome for COVID 19.  Past Medical History:  Diagnosis Date  . ALLERGIC RHINITIS   . CARDIOMYOPATHY    Nonischemic. Admitted in 3/11 with CHF exac, due to hyperthyroidism. LHC (03/11) showed clean coronaries with EF 40%. Myoview showed EF 38%. Echo was a technically difficult study with mild to moderately decreased EF, mild LVH, mild MR. Repeat echo (9/11) with EF 52-77%, mild diastolic dysfunction (grade I).  . CARPAL TUNNEL SYNDROME, LEFT, MILD   . CKD (chronic kidney disease), stage III ([place])   . Congestive heart failure, unspecified   . DIABETES MELLITUS, CONTROLLED   . DYSLIPIDEMIA   . Graves disease   . HYPERTENSION   . HYPERTHYROIDISM   . Obesity   . Osteoarthritis    esp L hip, low back  . Palpitations   . Premature ventricular contraction     Past Surgical History:  Procedure Laterality Date  . CARDIAC CATHETERIZATION  04/19/09  . CARDIAC CATHETERIZATION N/A  09/30/2015   Procedure: Left Heart Cath and Coronary Angiography;  Surgeon: Larey Dresser, MD;  Location: [facility];  Service: Cardiovascular;  Laterality: N/A;  . CHOLECYSTECTOMY    . remote hernia repair      Current Outpatient Medications  Medication Sig Dispense Refill  . acetaminophen (TYLENOL) 500 MG tablet Take 500 mg by mouth every 6 (six) hours as needed for moderate pain.    [name] aspirin 81 MG tablet Take 81 mg by mouth daily.      . carvedilol (COREG) 25 MG tablet Take 1 tablet (25 mg total) by mouth 2 (two) times daily. 180 tablet 3  . colchicine 0.6 MG tablet Take 0.6 mg by mouth daily as needed. As directed    . cyclobenzaprine (FLEXERIL) 10 MG tablet Take 1 tablet (10 mg total) by mouth every 8 (eight) hours as needed for muscle spasms. 90 tablet 6  . enalapril (VASOTEC) 10 MG tablet Take 1 tablet (10 mg total) by mouth every evening. 90 tablet 3  . furosemide (LASIX) 20 MG tablet Take 1 tablet (20 mg total) by mouth 3 (three) times a week. 45 tablet 2  . glucose blood (FREESTYLE TEST STRIPS) test strip Use to check blood sugars twice a day 300 each 1  . lovastatin (MEVACOR) 10 MG tablet Take 1 tablet (10 mg total) by mouth at bedtime. 90 tablet 3  . meloxicam (MOBIC) 15 MG tablet Take 1 tablet (15 mg total) by mouth daily. 90 tablet  1  . methimazole (TAPAZOLE) 5 MG tablet Take 1 tablet (5 mg total) by mouth 4 (four) times a week. 50 tablet 2  . Multiple Vitamin (MULTIVITAMIN) tablet Take 1 tablet by mouth daily.      . ondansetron (ZOFRAN) 8 MG tablet Take 1 tablet (8 mg total) by mouth every 8 (eight) hours as needed for nausea or vomiting. 90 tablet 3  . traMADol (ULTRAM) 50 MG tablet Take 1-2 tablets (50-100 mg total) by mouth daily as needed (pain). Needs visit for any refills 60 tablet 0   No current facility-administered medications for this visit.     Allergies:   Latex, Wool alcohol [lanolin], and Amitriptyline   Social History:  The patient  reports that  she has never smoked. She has never used smokeless tobacco. She reports that she does not drink alcohol or use drugs.   Family History:  The patient's  family history includes Asthma in her unknown relative; Breast cancer in her mother; Cervical cancer in her mother; Diabetes in her mother; Kidney cancer in her father; Stroke in her father; Supraventricular tachycardia in her sister.   ROS:  Please see the history of present illness.   All other systems are personally reviewed and negative.    Exam:    Vital Signs:  Ht 5\' 7"  (1.702 m)   Wt 275 lb (124.7 kg)   BMI 43.07 kg/m  BP 114/57 Well appearing, NAD, normal WOB   Labs/Other Tests and Data Reviewed:    Recent Labs: 08/14/2017: ALT 9; BUN 25; Creatinine, Ser 1.26; Hemoglobin 12.8; Platelets 286.0; Potassium 5.2; Sodium 140 11/15/2017: TSH 2.02   Wt Readings from Last 3 Encounters:  07/25/18 275 lb (124.7 kg)  11/15/17 286 lb 9.6 oz (130 kg)  08/14/17 294 lb (133.4 kg)      ASSESSMENT & PLAN:    1.  PVCs Asymptomatic and controlled No change required today  2. HTN Stable No change required today  3. Overweight Regular exercise encouraged Lifestyle modification advised  Follow-up:  EP PA in a year   Patient Risk:  after full review of this patients clinical status, I feel that they are at moderate risk at this time.  Today, I have spent 15 minutes with the patient with telehealth technology discussing arrhythmia management .    Army Fossa, MD  07/25/2018 12:06 PM     [address] [place] [address] [phone] (office) [phone] (fax)

## 2018-07-26 MED ORDER — TRAMADOL HCL 50 MG PO TABS: 50.0000 mg | ORAL_TABLET | Freq: Every day | ORAL | 5 refills | Status: DC | PRN

## 2018-07-26 NOTE — Assessment & Plan Note (Signed)
Takes tramadol BID and seems to be helping well. Given some inconsistencies with fill requests will check UDS. Database not accurate as she gets her meds from [place] which is not listed through mail order. Reminded about risks and benefits and she wishes to continue with therapy.

## 2018-07-26 NOTE — Assessment & Plan Note (Signed)
Checking HgA1c and lipid panel and microalbumin to creatinine ratio. Reminded about yearly eye exam. Adjust as needed she is diet controlled.

## 2018-07-26 NOTE — Assessment & Plan Note (Signed)
Checking lipid panel and adjust lovastatin as needed for LDL goal <100.

## 2018-07-26 NOTE — Assessment & Plan Note (Signed)
BP at goal at home on her lasix and enalapril and coreg. Checking CMP and adjust as needed.

## 2018-08-13 ENCOUNTER — Other Ambulatory Visit (INDEPENDENT_AMBULATORY_CARE_PROVIDER_SITE_OTHER): Payer: Medicare Other

## 2018-08-13 DIAGNOSIS — E0580 Other thyrotoxicosis without thyrotoxic crisis or storm: Secondary | ICD-10-CM | POA: Diagnosis not present

## 2018-08-13 DIAGNOSIS — E118 Type 2 diabetes mellitus with unspecified complications: Secondary | ICD-10-CM | POA: Diagnosis not present

## 2018-08-13 LAB — TSH: TSH: 2.12 u[IU]/mL (ref 0.35–4.50)

## 2018-08-13 LAB — HEMOGLOBIN A1C: Hgb A1c MFr Bld: 5.7 % (ref 4.6–6.5)

## 2018-08-13 LAB — LIPID PANEL
Cholesterol: 111 mg/dL (ref 0–200)
HDL: 49.1 mg/dL (ref >39.00)
LDL Cholesterol: 47 mg/dL (ref 0–99)
NonHDL: 61.76
Total CHOL/HDL Ratio: 2
Triglycerides: 72 mg/dL (ref 0.0–149.0)
VLDL: 14.4 mg/dL (ref 0.0–40.0)

## 2018-08-13 LAB — T4, FREE: Free T4: 1.04 ng/dL (ref 0.60–1.60)

## 2018-08-13 LAB — CBC
HCT: 37.4 % (ref 36.0–46.0)
Hemoglobin: 12.4 g/dL (ref 12.0–15.0)
MCHC: 33.1 g/dL (ref 30.0–36.0)
MCV: 89.3 fl (ref 78.0–100.0)
Platelets: 257 10*3/uL (ref 150.0–400.0)
RBC: 4.19 Mil/uL (ref 3.87–5.11)
RDW: 14.3 % (ref 11.5–15.5)
WBC: 8.7 10*3/uL (ref 4.0–10.5)

## 2018-10-28 ENCOUNTER — Encounter: Payer: Self-pay | Admitting: Endocrinology

## 2018-10-28 ENCOUNTER — Other Ambulatory Visit: Payer: Self-pay

## 2018-10-28 DIAGNOSIS — E041 Nontoxic single thyroid nodule: Secondary | ICD-10-CM

## 2018-10-28 DIAGNOSIS — I493 Ventricular premature depolarization: Secondary | ICD-10-CM

## 2018-10-28 DIAGNOSIS — I5032 Chronic diastolic (congestive) heart failure: Secondary | ICD-10-CM

## 2018-10-28 MED ORDER — METHIMAZOLE 5 MG PO TABS: 5.0000 mg | ORAL_TABLET | ORAL | 0 refills | Status: DC

## 2018-10-28 MED ORDER — ENALAPRIL MALEATE 10 MG PO TABS: 10.0000 mg | ORAL_TABLET | Freq: Every evening | ORAL | 3 refills | Status: DC

## 2018-10-28 NOTE — Telephone Encounter (Signed)
Enalapril refill sent to [facility] per pt request

## 2018-10-29 ENCOUNTER — Telehealth: Payer: Self-pay

## 2018-10-29 ENCOUNTER — Other Ambulatory Visit: Payer: Self-pay

## 2018-10-29 DIAGNOSIS — E041 Nontoxic single thyroid nodule: Secondary | ICD-10-CM

## 2018-10-29 MED ORDER — METHIMAZOLE 5 MG PO TABS: 5.0000 mg | ORAL_TABLET | ORAL | 0 refills | Status: DC

## 2018-10-29 NOTE — Telephone Encounter (Signed)
Following message received from pt via My Chart:  I guess I need to get an appointment.  But when I called the office I couldn't get anyone to answer. Please give me an appointment Any time after 1:00 pm is fine. thank you Katie Higgins August 05, 1950 [phone]  Routing this message to front desk for scheduling purposes.

## 2018-10-30 NOTE — Telephone Encounter (Signed)
LMTCB to schedule appointment °

## 2018-11-01 NOTE — Telephone Encounter (Signed)
LMTCB x 2 to schedule appointment °

## 2018-11-04 DIAGNOSIS — I5032 Chronic diastolic (congestive) heart failure: Secondary | ICD-10-CM

## 2018-11-04 DIAGNOSIS — I493 Ventricular premature depolarization: Secondary | ICD-10-CM

## 2018-11-04 MED ORDER — ENALAPRIL MALEATE 10 MG PO TABS: 10.0000 mg | ORAL_TABLET | Freq: Every evening | ORAL | 3 refills | Status: DC

## 2018-11-07 NOTE — Telephone Encounter (Signed)
Patient is scheduled for appointment on 11/14/18 at 3:30 p.m.

## 2018-11-14 ENCOUNTER — Encounter: Payer: Self-pay | Admitting: Endocrinology

## 2018-11-14 ENCOUNTER — Ambulatory Visit (INDEPENDENT_AMBULATORY_CARE_PROVIDER_SITE_OTHER): Payer: Medicare Other | Admitting: Endocrinology

## 2018-11-14 ENCOUNTER — Other Ambulatory Visit: Payer: Self-pay

## 2018-11-14 VITALS — BP 128/68 | HR 98 | Ht 67.0 in | Wt 284.0 lb

## 2018-11-14 DIAGNOSIS — Z23 Encounter for immunization: Secondary | ICD-10-CM

## 2018-11-14 DIAGNOSIS — E118 Type 2 diabetes mellitus with unspecified complications: Secondary | ICD-10-CM

## 2018-11-14 DIAGNOSIS — E041 Nontoxic single thyroid nodule: Secondary | ICD-10-CM | POA: Diagnosis not present

## 2018-11-14 DIAGNOSIS — E0580 Other thyrotoxicosis without thyrotoxic crisis or storm: Secondary | ICD-10-CM

## 2018-11-14 DIAGNOSIS — R739 Hyperglycemia, unspecified: Secondary | ICD-10-CM

## 2018-11-14 LAB — POCT GLYCOSYLATED HEMOGLOBIN (HGB A1C): Hemoglobin A1C: 5.7 % — AB (ref 4.0–5.6)

## 2018-11-14 MED ORDER — METHIMAZOLE 5 MG PO TABS: 5.0000 mg | ORAL_TABLET | ORAL | 0 refills | Status: DC

## 2018-11-14 NOTE — Patient Instructions (Signed)
Blood tests are requested for you today.  We'll let you know about the results.  If ever you have fever while taking methimazole, stop it and call us, even if the reason is obvious, because of the risk of a rare side-effect.  Please come back for a follow-up appointment in 6 months.   

## 2018-11-14 NOTE — Progress Notes (Signed)
Subjective:    Patient ID: Katie Higgins, female    DOB: [birth year], 68 y.o.   MRN: [id number]  HPI Pt returns for f/u of hyperthyroidism (due to [place] dz; dx'ed 2010; US showed heterogeneity of texture and one small nodule; she has been unable to safely d/c tapazole for RAI rx, due to CHF).  pt states she feels well in general.  Specifically, she denies palpitations, tremor, and anxiety.  She takes tapazole as rx'ed.   Past Medical History:  Diagnosis Date  . ALLERGIC RHINITIS   . CARDIOMYOPATHY    Nonischemic. Admitted in 3/11 with CHF exac, due to hyperthyroidism. LHC (03/11) showed clean coronaries with EF 40%. Myoview showed EF 38%. Echo was a technically difficult study with mild to moderately decreased EF, mild LVH, mild MR. Repeat echo (9/11) with EF 0000000, mild diastolic dysfunction (grade I).  . CARPAL TUNNEL SYNDROME, LEFT, MILD   . CKD (chronic kidney disease), stage III   . Congestive heart failure, unspecified   . DIABETES MELLITUS, CONTROLLED   . DYSLIPIDEMIA   . Graves disease   . HYPERTENSION   . HYPERTHYROIDISM   . Obesity   . Osteoarthritis    esp L hip, low back  . Palpitations   . Premature ventricular contraction     Past Surgical History:  Procedure Laterality Date  . CARDIAC CATHETERIZATION  04/19/09  . CARDIAC CATHETERIZATION N/A 09/30/2015   Procedure: Left Heart Cath and Coronary Angiography;  Surgeon: Larey Dresser, MD;  Location: [facility];  Service: Cardiovascular;  Laterality: N/A;  . CHOLECYSTECTOMY    . remote hernia repair      Social History   Socioeconomic History  . Marital status: Widowed    Spouse name: Not on file  . Number of children: Not on file  . Years of education: Not on file  . Highest education level: Not on file  Occupational History  . Not on file  Social Needs  . Financial resource strain: Not on file  . Food insecurity    Worry: Not on file    Inability: Not on file  . Transportation needs   Medical: Not on file    Non-medical: Not on file  Tobacco Use  . Smoking status: Never Smoker  . Smokeless tobacco: Never Used  Substance and Sexual Activity  . Alcohol use: No  . Drug use: No  . Sexual activity: Not on file  Lifestyle  . Physical activity    Days per week: Not on file    Minutes per session: Not on file  . Stress: Not on file  Relationships  . Social Herbalist on phone: Not on file    Gets together: Not on file    Attends religious service: Not on file    Active member of club or organization: Not on file    Attends meetings of clubs or organizations: Not on file    Relationship status: Not on file  . Intimate partner violence    Fear of current or ex partner: Not on file    Emotionally abused: Not on file    Physically abused: Not on file    Forced sexual activity: Not on file  Other Topics Concern  . Not on file  Social History Narrative   Lives in [place] with her son.   Widowed since 1992.   She is very sedentary, uses a wheelchair when out of the house.   Ambulatory in home  with cane.    Current Outpatient Medications on File Prior to Visit  Medication Sig Dispense Refill  . acetaminophen (TYLENOL) 500 MG tablet Take 500 mg by mouth every 6 (six) hours as needed for moderate pain.    [name] aspirin 81 MG tablet Take 81 mg by mouth daily.      . carvedilol (COREG) 25 MG tablet Take 1 tablet (25 mg total) by mouth 2 (two) times daily. 180 tablet 3  . colchicine 0.6 MG tablet Take 0.6 mg by mouth daily as needed. As directed    . enalapril (VASOTEC) 10 MG tablet Take 1 tablet (10 mg total) by mouth every evening. 90 tablet 3  . furosemide (LASIX) 20 MG tablet Take 1 tablet (20 mg total) by mouth 3 (three) times a week. 45 tablet 3  . glucose blood (FREESTYLE TEST STRIPS) test strip Use to check blood sugars twice a day 300 each 1  . lovastatin (MEVACOR) 10 MG tablet Take 1 tablet (10 mg total) by mouth at bedtime. 90 tablet 3  . meloxicam  (MOBIC) 15 MG tablet Take 1 tablet (15 mg total) by mouth daily. 90 tablet 1  . Multiple Vitamin (MULTIVITAMIN) tablet Take 1 tablet by mouth daily.      . ondansetron (ZOFRAN) 8 MG tablet Take 1 tablet (8 mg total) by mouth every 8 (eight) hours as needed for nausea or vomiting. 90 tablet 3  . traMADol (ULTRAM) 50 MG tablet Take 1-2 tablets (50-100 mg total) by mouth daily as needed (pain). 60 tablet 5  . cyclobenzaprine (FLEXERIL) 10 MG tablet Take 1 tablet (10 mg total) by mouth every 8 (eight) hours as needed for muscle spasms. 90 tablet 6   No current facility-administered medications on file prior to visit.     Allergies  Allergen Reactions  . Latex Rash  . Wool Alcohol [Lanolin] Rash  . Amitriptyline Other (See Comments)    Leg cramps, trimmers     Family History  Problem Relation Age of Onset  . Diabetes Mother   . Breast cancer Mother   . Cervical cancer Mother   . Kidney cancer Father        Kidney Cancer  . Stroke Father        CVA  . Supraventricular tachycardia Sister   . Asthma Unknown        family history    BP 128/68 (BP Location: Right Wrist, Patient Position: Sitting, Cuff Size: Normal)   Pulse 98   Ht 5\' 7"  (1.702 m)   Wt 284 lb (128.8 kg) Comment: declined to get on to scales; verbalized  SpO2 100%   BMI 44.48 kg/m    Review of Systems Denies fever    Objective:   Physical Exam VITAL SIGNS:  See vs page GENERAL: no distress.  In wheelchair NECK: There is no palpable thyroid enlargement.  No thyroid nodule is palpable.  No palpable lymphadenopathy at the anterior neck.    Lab Results  Component Value Date   HGBA1C 5.7 (A) 11/14/2018   Lab Results  Component Value Date   TSH 2.11 11/14/2018      Assessment & Plan:  Hyperglycemia: stable Hyperthyroidism: well-controlled  Patient Instructions  Blood tests are requested for you today.  We'll let you know about the results.  If ever you have fever while taking methimazole, stop it and  call us, even if the reason is obvious, because of the risk of a rare side-effect. Please come back for a  follow-up appointment in 6 months.

## 2018-11-15 LAB — TSH: TSH: 2.11 u[IU]/mL (ref 0.35–4.50)

## 2018-11-15 LAB — T4, FREE: Free T4: 1.02 ng/dL (ref 0.60–1.60)

## 2018-11-16 DIAGNOSIS — Z8639 Personal history of other endocrine, nutritional and metabolic disease: Secondary | ICD-10-CM | POA: Insufficient documentation

## 2018-11-16 DIAGNOSIS — R739 Hyperglycemia, unspecified: Secondary | ICD-10-CM | POA: Insufficient documentation

## 2018-12-20 ENCOUNTER — Other Ambulatory Visit: Payer: Self-pay

## 2018-12-20 ENCOUNTER — Encounter: Payer: Self-pay | Admitting: Endocrinology

## 2018-12-20 DIAGNOSIS — E041 Nontoxic single thyroid nodule: Secondary | ICD-10-CM

## 2018-12-20 MED ORDER — METHIMAZOLE 5 MG PO TABS: 5.0000 mg | ORAL_TABLET | ORAL | 4 refills | Status: DC

## 2018-12-26 ENCOUNTER — Encounter: Payer: Self-pay | Admitting: Internal Medicine

## 2018-12-26 MED ORDER — MELOXICAM 15 MG PO TABS: 15.0000 mg | ORAL_TABLET | Freq: Every day | ORAL | 1 refills | Status: DC

## 2019-01-01 ENCOUNTER — Encounter: Payer: Self-pay | Admitting: Internal Medicine

## 2019-01-01 DIAGNOSIS — M1712 Unilateral primary osteoarthritis, left knee: Secondary | ICD-10-CM

## 2019-01-01 DIAGNOSIS — R52 Pain, unspecified: Secondary | ICD-10-CM

## 2019-01-01 DIAGNOSIS — M1711 Unilateral primary osteoarthritis, right knee: Secondary | ICD-10-CM

## 2019-01-06 ENCOUNTER — Encounter: Payer: Self-pay | Admitting: Internal Medicine

## 2019-01-08 ENCOUNTER — Other Ambulatory Visit: Payer: Medicare Other

## 2019-01-08 DIAGNOSIS — R52 Pain, unspecified: Secondary | ICD-10-CM

## 2019-01-09 LAB — PAIN MGMT, PROFILE 8 W/CONF, U
6 Acetylmorphine: NEGATIVE ng/mL
Alcohol Metabolites: NEGATIVE ng/mL (ref <500)
Amphetamines: NEGATIVE ng/mL
Benzodiazepines: NEGATIVE ng/mL
Buprenorphine, Urine: NEGATIVE ng/mL
Cocaine Metabolite: NEGATIVE ng/mL
Creatinine: 90.5 mg/dL
MDMA: NEGATIVE ng/mL
Marijuana Metabolite: NEGATIVE ng/mL
Opiates: NEGATIVE ng/mL
Oxidant: NEGATIVE ug/mL
Oxycodone: NEGATIVE ng/mL
pH: 5.1 (ref 4.5–9.0)

## 2019-01-10 ENCOUNTER — Encounter: Payer: Self-pay | Admitting: Internal Medicine

## 2019-01-13 MED ORDER — TRAMADOL HCL 50 MG PO TABS: 50.0000 mg | ORAL_TABLET | Freq: Every day | ORAL | 5 refills | Status: DC | PRN

## 2019-01-13 NOTE — Addendum Note (Signed)
Addended by: Pricilla Holm A on: 01/13/2019 12:39 PM   Modules accepted: Orders

## 2019-01-15 ENCOUNTER — Encounter: Payer: Self-pay | Admitting: Internal Medicine

## 2019-05-13 ENCOUNTER — Other Ambulatory Visit: Payer: Self-pay

## 2019-05-15 ENCOUNTER — Ambulatory Visit (INDEPENDENT_AMBULATORY_CARE_PROVIDER_SITE_OTHER): Payer: Medicare Other | Admitting: Endocrinology

## 2019-05-15 ENCOUNTER — Encounter: Payer: Self-pay | Admitting: Endocrinology

## 2019-05-15 ENCOUNTER — Other Ambulatory Visit: Payer: Self-pay

## 2019-05-15 VITALS — BP 130/70 | HR 87 | Ht 67.0 in | Wt 295.0 lb

## 2019-05-15 DIAGNOSIS — R739 Hyperglycemia, unspecified: Secondary | ICD-10-CM

## 2019-05-15 DIAGNOSIS — E0580 Other thyrotoxicosis without thyrotoxic crisis or storm: Secondary | ICD-10-CM | POA: Diagnosis not present

## 2019-05-15 DIAGNOSIS — E118 Type 2 diabetes mellitus with unspecified complications: Secondary | ICD-10-CM

## 2019-05-15 LAB — TSH: TSH: 2.31 u[IU]/mL (ref 0.35–4.50)

## 2019-05-15 LAB — HEMOGLOBIN A1C: Hgb A1c MFr Bld: 5.8 % (ref 4.6–6.5)

## 2019-05-15 LAB — T4, FREE: Free T4: 1 ng/dL (ref 0.60–1.60)

## 2019-05-15 NOTE — Patient Instructions (Signed)
Blood tests are requested for you today.  We'll let you know about the results.  If ever you have fever while taking methimazole, stop it and call us, even if the reason is obvious, because of the risk of a rare side-effect. It is best to never miss the medication.  However, if you do miss it, next best is to double up the next time.   Please come back for a follow-up appointment in 6 months.

## 2019-05-15 NOTE — Progress Notes (Signed)
Subjective:    Patient ID: Katie Higgins, female    DOB: 13-Feb-1950, 69 y.o.   MRN: [id number]  HPI Pt returns for f/u of hyperthyroidism (due to [place] dz; dx'ed 2010; US showed heterogeneity of texture and one small nodule; she has been unable to safely d/c tapazole for RAI rx, due to CHF).  pt states she feels well in general.  Specifically, she denies palpitations and anxiety.  She takes tapazole as rx'ed.   Past Medical History:  Diagnosis Date  . ALLERGIC RHINITIS   . CARDIOMYOPATHY    Nonischemic. Admitted in 3/11 with CHF exac, due to hyperthyroidism. LHC (03/11) showed clean coronaries with EF 40%. Myoview showed EF 38%. Echo was a technically difficult study with mild to moderately decreased EF, mild LVH, mild MR. Repeat echo (9/11) with EF 0000000, mild diastolic dysfunction (grade I).  . CARPAL TUNNEL SYNDROME, LEFT, MILD   . CKD (chronic kidney disease), stage III   . Congestive heart failure, unspecified   . DIABETES MELLITUS, CONTROLLED   . DYSLIPIDEMIA   . Graves disease   . HYPERTENSION   . HYPERTHYROIDISM   . Obesity   . Osteoarthritis    esp L hip, low back  . Palpitations   . Premature ventricular contraction     Past Surgical History:  Procedure Laterality Date  . CARDIAC CATHETERIZATION  04/19/09  . CARDIAC CATHETERIZATION N/A 09/30/2015   Procedure: Left Heart Cath and Coronary Angiography;  Surgeon: Larey Dresser, MD;  Location: [facility];  Service: Cardiovascular;  Laterality: N/A;  . CHOLECYSTECTOMY    . remote hernia repair      Social History   Socioeconomic History  . Marital status: Widowed    Spouse name: Not on file  . Number of children: Not on file  . Years of education: Not on file  . Highest education level: Not on file  Occupational History  . Not on file  Tobacco Use  . Smoking status: Never Smoker  . Smokeless tobacco: Never Used  Substance and Sexual Activity  . Alcohol use: No  . Drug use: No  . Sexual activity:  Not on file  Other Topics Concern  . Not on file  Social History Narrative   Lives in [place] with her son.   Widowed since 1992.   She is very sedentary, uses a wheelchair when out of the house.   Ambulatory in home with cane.   Social Determinants of Health   Financial Resource Strain:   . Difficulty of Paying Living Expenses:   Food Insecurity:   . Worried About Charity fundraiser in the Last Year:   . Arboriculturist in the Last Year:   Transportation Needs:   . Film/video editor (Medical):   [name] Lack of Transportation (Non-Medical):   Physical Activity:   . Days of Exercise per Week:   . Minutes of Exercise per Session:   Stress:   . Feeling of Stress :   Social Connections:   . Frequency of Communication with Friends and Family:   . Frequency of Social Gatherings with Friends and Family:   . Attends Religious Services:   . Active Member of Clubs or Organizations:   . Attends Archivist Meetings:   [name] Marital Status:   Intimate Partner Violence:   . Fear of Current or Ex-Partner:   . Emotionally Abused:   [name] Physically Abused:   . Sexually Abused:  Current Outpatient Medications on File Prior to Visit  Medication Sig Dispense Refill  . acetaminophen (TYLENOL) 500 MG tablet Take 500 mg by mouth every 6 (six) hours as needed for moderate pain.    [name] aspirin 81 MG tablet Take 81 mg by mouth daily.      . carvedilol (COREG) 25 MG tablet Take 1 tablet (25 mg total) by mouth 2 (two) times daily. 180 tablet 3  . colchicine 0.6 MG tablet Take 0.6 mg by mouth daily as needed. As directed    . enalapril (VASOTEC) 10 MG tablet Take 1 tablet (10 mg total) by mouth every evening. 90 tablet 3  . furosemide (LASIX) 20 MG tablet Take 1 tablet (20 mg total) by mouth 3 (three) times a week. 45 tablet 3  . glucose blood (FREESTYLE TEST STRIPS) test strip Use to check blood sugars twice a day 300 each 1  . lovastatin (MEVACOR) 10 MG tablet Take 1 tablet (10 mg total)  by mouth at bedtime. 90 tablet 3  . meloxicam (MOBIC) 15 MG tablet Take 1 tablet (15 mg total) by mouth daily. 90 tablet 1  . methimazole (TAPAZOLE) 5 MG tablet Take 1 tablet (5 mg total) by mouth 4 (four) times a week. 16 tablet 4  . Multiple Vitamin (MULTIVITAMIN) tablet Take 1 tablet by mouth daily.      . ondansetron (ZOFRAN) 8 MG tablet Take 1 tablet (8 mg total) by mouth every 8 (eight) hours as needed for nausea or vomiting. 90 tablet 3  . traMADol (ULTRAM) 50 MG tablet Take 1-2 tablets (50-100 mg total) by mouth daily as needed (pain). 60 tablet 5  . cyclobenzaprine (FLEXERIL) 10 MG tablet Take 1 tablet (10 mg total) by mouth every 8 (eight) hours as needed for muscle spasms. 90 tablet 6   No current facility-administered medications on file prior to visit.    Allergies  Allergen Reactions  . Latex Rash  . Wool Alcohol [Lanolin] Rash  . Amitriptyline Other (See Comments)    Leg cramps, trimmers     Family History  Problem Relation Age of Onset  . Diabetes Mother   . Breast cancer Mother   . Cervical cancer Mother   . Kidney cancer Father        Kidney Cancer  . Stroke Father        CVA  . Supraventricular tachycardia Sister   . Asthma Unknown        family history    BP 130/70   Pulse 87   Ht 5\' 7"  (1.702 m) Comment: verbalized. unable to stand  Wt 295 lb (133.8 kg) Comment: verbalized. unable to stand  SpO2 98%   BMI 46.20 kg/m    Review of Systems Denies fever    Objective:   Physical Exam VITAL SIGNS:  See vs page GENERAL: no distress.   NECK: There is no palpable thyroid enlargement.  No thyroid nodule is palpable.  No palpable lymphadenopathy at the anterior neck.   Lab Results  Component Value Date   HGBA1C 5.8 05/15/2019   Lab Results  Component Value Date   TSH 2.31 05/15/2019       Assessment & Plan:  Hyperthyroidism: well-controlled.  Please continue the same medication. Hyperglycemia: stable: we'll follow  Patient Instructions   Blood tests are requested for you today.  We'll let you know about the results.  If ever you have fever while taking methimazole, stop it and call us, even if the reason is  obvious, because of the risk of a rare side-effect. It is best to never miss the medication.  However, if you do miss it, next best is to double up the next time.   Please come back for a follow-up appointment in 6 months.

## 2019-05-18 DIAGNOSIS — I5032 Chronic diastolic (congestive) heart failure: Secondary | ICD-10-CM

## 2019-05-18 DIAGNOSIS — R0602 Shortness of breath: Secondary | ICD-10-CM

## 2019-05-21 MED ORDER — LOVASTATIN 10 MG PO TABS: 10.0000 mg | ORAL_TABLET | Freq: Every day | ORAL | 3 refills | Status: DC

## 2019-05-21 NOTE — Addendum Note (Signed)
Addended by: Willeen Cass A on: 05/21/2019 08:44 AM   Modules accepted: Orders

## 2019-06-16 ENCOUNTER — Encounter: Payer: Self-pay | Admitting: Endocrinology

## 2019-06-16 ENCOUNTER — Other Ambulatory Visit: Payer: Self-pay

## 2019-06-16 DIAGNOSIS — E041 Nontoxic single thyroid nodule: Secondary | ICD-10-CM

## 2019-06-16 MED ORDER — METHIMAZOLE 5 MG PO TABS: 5.0000 mg | ORAL_TABLET | ORAL | 1 refills | Status: DC

## 2019-06-29 ENCOUNTER — Encounter: Payer: Self-pay | Admitting: Internal Medicine

## 2019-06-30 MED ORDER — CYCLOBENZAPRINE HCL 5 MG PO TABS: 5.0000 mg | ORAL_TABLET | Freq: Three times a day (TID) | ORAL | 0 refills | Status: DC | PRN

## 2019-07-13 NOTE — Progress Notes (Signed)
Cardiology Office Note Date:  07/14/2019  Patient ID:  Katie Higgins, [place] Jul 04, 1950, MRN [id number] PCP:  Hoyt Koch, MD  Cardiologist:  Dr. [name] (2017) EP: Dr. [name] Endo: Dr. [name]    Chief Complaint: annual visit  History of Present Illness: Katie Higgins is a 69 y.o. female with history of hyperthyroidism, PVCs, DM, HLD, HTN, CKD (III), morbid obesity. 2011 w/u noted CM > LHC w/normal coronaries.  CM suspect 2/2 hyperthyroidism, and did improve with therapy.  She was referred to Dr. [name] who felt with minimial symptoms from PVCs and imporvement of her EF, pt preference to avoid invasive procedure, no plans for EP/ablation  She saw A. Lynnell Jude, NP 2018, described as essentially wheelchair bound, no symptoms of PVCs, planned to update her echo, if remained preserved LVEF to continue medical therapy. LVEF was 55-60% She has seen Dr. [name] annually, most recently via tele health June 2020, doing well, no changes were made, planned for annual APP visit.  TODAY She is accompanied by her son today.  She is in a wheelchair, but reports that she is able to ambulates ome.  She has bad OA of her hips and knees that is why she uses a wheelchair.  She says she can stand long enough to cook, wash up, able to do her ADLs independently.  She denies any palpitations or awareness of any PVCs if she is having any.  She will rarely get a fleeting/momentary "pang" in the center of her chest.  maybe the last was 6 mo or so ago.  No rest SOB, no DOE at her level of exertinal.  She denies symptoms of orthopnea or PND.   No dizzy spells, near syncope or syncope.  She has noticed swelling in her feet for the last 3 weeks, it is nearly if not completely resolved in the AM, but then some days uncomfortably swollen.  She notes this is associated with an aching discomfort to the very lateral edges of her feet, like she has been walking on the edge of her feet, or has a "rock bruise"   Past  Medical History:  Diagnosis Date  . ALLERGIC RHINITIS   . CARDIOMYOPATHY    Nonischemic. Admitted in 3/11 with CHF exac, due to hyperthyroidism. LHC (03/11) showed clean coronaries with EF 40%. Myoview showed EF 38%. Echo was a technically difficult study with mild to moderately decreased EF, mild LVH, mild MR. Repeat echo (9/11) with EF 51-88%, mild diastolic dysfunction (grade I).  . CARPAL TUNNEL SYNDROME, LEFT, MILD   . CKD (chronic kidney disease), stage III   . Congestive heart failure, unspecified   . DIABETES MELLITUS, CONTROLLED   . DYSLIPIDEMIA   . Graves disease   . HYPERTENSION   . HYPERTHYROIDISM   . Obesity   . Osteoarthritis    esp L hip, low back  . Palpitations   . Premature ventricular contraction     Past Surgical History:  Procedure Laterality Date  . CARDIAC CATHETERIZATION  04/19/09  . CARDIAC CATHETERIZATION N/A 09/30/2015   Procedure: Left Heart Cath and Coronary Angiography;  Surgeon: Larey Dresser, MD;  Location: [facility];  Service: Cardiovascular;  Laterality: N/A;  . CHOLECYSTECTOMY    . remote hernia repair      Current Outpatient Medications  Medication Sig Dispense Refill  . acetaminophen (TYLENOL) 500 MG tablet Take 500 mg by mouth every 6 (six) hours as needed for moderate pain.    [name] aspirin 81  MG tablet Take 81 mg by mouth daily.      . blood glucose meter kit and supplies Dispense based on patient and insurance preference. Use up to four times daily as directed. (FOR ICD-10 E10.9, E11.9). 1 each 0  . carvedilol (COREG) 25 MG tablet Take 1 tablet (25 mg total) by mouth 2 (two) times daily. 180 tablet 3  . colchicine 0.6 MG tablet Take 0.6 mg by mouth daily as needed. As directed    . cyclobenzaprine (FLEXERIL) 5 MG tablet Take 1 tablet (5 mg total) by mouth 3 (three) times daily as needed for muscle spasms. 30 tablet 0  . enalapril (VASOTEC) 10 MG tablet Take 1 tablet (10 mg total) by mouth every evening. 90 tablet 3  . furosemide  (LASIX) 20 MG tablet Take 1 tablet (20 mg total) by mouth 3 (three) times a week. 45 tablet 3  . glucose blood (FREESTYLE TEST STRIPS) test strip Use to check blood sugars twice a day 300 each 1  . lovastatin (MEVACOR) 10 MG tablet Take 1 tablet (10 mg total) by mouth at bedtime. 90 tablet 3  . meloxicam (MOBIC) 15 MG tablet Take 1 tablet (15 mg total) by mouth daily. 90 tablet 1  . methimazole (TAPAZOLE) 5 MG tablet Take 1 tablet (5 mg total) by mouth 4 (four) times a week. 48 tablet 1  . Multiple Vitamin (MULTIVITAMIN) tablet Take 1 tablet by mouth daily.      . ondansetron (ZOFRAN) 8 MG tablet Take 1 tablet (8 mg total) by mouth every 8 (eight) hours as needed for nausea or vomiting. 90 tablet 3  . traMADol (ULTRAM) 50 MG tablet Take 1-2 tablets (50-100 mg total) by mouth daily as needed (pain). 60 tablet 5   No current facility-administered medications for this visit.    Allergies:   Latex, Wool alcohol [lanolin], and Amitriptyline   Social History:  The patient  reports that she has never smoked. She has never used smokeless tobacco. She reports that she does not drink alcohol or use drugs.   Family History:  The patient's family history includes Asthma in her unknown relative; Breast cancer in her mother; Cervical cancer in her mother; Diabetes in her mother; Kidney cancer in her father; Stroke in her father; Supraventricular tachycardia in her sister.  ROS:  Please see the history of present illness.  All other systems are reviewed and otherwise negative.   PHYSICAL EXAM:  VS:  BP 136/66   Pulse 81   Ht 5' 7"  (1.702 m)   Wt 286 lb (129.7 kg)   BMI 44.79 kg/m  BMI: Body mass index is 44.79 kg/m. Well nourished, well developed, in no acute distress  HEENT: normocephalic, atraumatic  Neck: no JVD, carotid bruits or masses Cardiac:  RRR; no significant murmurs, no rubs, or gallops Lungs:  CTA b/l, no wheezing, rhonchi or rales  Abd: soft, nontender, she has a very large  pannus MS: no deformity or atrophy Ext: she has some brawny type edema to mid shin, 1+ pitting edema feet, no skin changes or wound No calf tenderness, no asymmetric edema. She has 3+ DP pulses b/l, 1+ PT, and brisk cap refil b/l Skin: warm and dry, no rash Neuro:  No gross deficits appreciated Psych: euthymic mood, full affect   EKG:  Done today and reviewed by myself shows  SR 81bpm, no ST/T changes   05/11/2016: TTE Study Conclusions  - Left ventricle: The cavity size was normal. Systolic function was  normal. The estimated ejection fraction was in the range of 55%  to 60%. Wall motion was normal; there were no regional wall  motion abnormalities. Doppler parameters are consistent with  abnormal left ventricular relaxation (grade 1 diastolic  dysfunction). Doppler parameters are consistent with elevated  ventricular end-diastolic filling pressure. Mean left atrial  pressure appears to be normal at rest.    09/30/2015: LHC No obstructive coronary disease.  Small caliber distal LAD but similar to prior study.    Recent Labs: 08/13/2018: Hemoglobin 12.4; Platelets 257.0 05/15/2019: TSH 2.31  08/13/2018: Cholesterol 111; HDL 49.10; LDL Cholesterol 47; Total CHOL/HDL Ratio 2; Triglycerides 72.0; VLDL 14.4   CrCl cannot be calculated (Patient's most recent lab result is older than the maximum 21 days allowed.).   Wt Readings from Last 3 Encounters:  07/14/19 286 lb (129.7 kg)  05/15/19 295 lb (133.8 kg)  11/14/18 284 lb (128.8 kg)     Other studies reviewed: Additional studies/records reviewed today include: summarized above  ASSESSMENT AND PLAN:  1. PVCs     No symptoms to suggest any significant burden.     None today on her EKG or rhythm strip  2. NICM     Recovered LVEF, suspect 2/2 hyperthyriodism     She has some edema, sounds more of dependent, though is new     Recommend she take her lasix QD for 3 days then resume 3d/week     Her PMD did full labs  today including BNP     She has no changes to her exertional capacity, symptoms of SOB or nocturnal symptoms.     Discussed minimizing sodium and elevating feet when seated     If her lasix requirements remain increased, will update her echo, she is alsked to let me know  3. HTN     Looks good    Disposition: F/u  78mo sooner if needed  Current medicines are reviewed at length with the patient today.  The patient did not have any concerns regarding medicines  Signed, RTommye Standard PA-C 07/14/2019 3:13 PM     [place] [phone](office)  [phone])

## 2019-07-14 ENCOUNTER — Ambulatory Visit (INDEPENDENT_AMBULATORY_CARE_PROVIDER_SITE_OTHER): Payer: Medicare Other | Admitting: Physician Assistant

## 2019-07-14 ENCOUNTER — Other Ambulatory Visit: Payer: Self-pay

## 2019-07-14 ENCOUNTER — Ambulatory Visit: Payer: Medicare Other | Admitting: Physician Assistant

## 2019-07-14 ENCOUNTER — Encounter: Payer: Self-pay | Admitting: Internal Medicine

## 2019-07-14 ENCOUNTER — Ambulatory Visit (INDEPENDENT_AMBULATORY_CARE_PROVIDER_SITE_OTHER): Payer: Medicare Other | Admitting: Internal Medicine

## 2019-07-14 VITALS — BP 132/84 | HR 84 | Temp 99.0°F | Ht 67.0 in

## 2019-07-14 VITALS — BP 136/66 | HR 81 | Ht 67.0 in | Wt 286.0 lb

## 2019-07-14 DIAGNOSIS — I428 Other cardiomyopathies: Secondary | ICD-10-CM | POA: Diagnosis not present

## 2019-07-14 DIAGNOSIS — I10 Essential (primary) hypertension: Secondary | ICD-10-CM

## 2019-07-14 DIAGNOSIS — E0580 Other thyrotoxicosis without thyrotoxic crisis or storm: Secondary | ICD-10-CM

## 2019-07-14 DIAGNOSIS — I5032 Chronic diastolic (congestive) heart failure: Secondary | ICD-10-CM | POA: Diagnosis not present

## 2019-07-14 DIAGNOSIS — I493 Ventricular premature depolarization: Secondary | ICD-10-CM

## 2019-07-14 DIAGNOSIS — E785 Hyperlipidemia, unspecified: Secondary | ICD-10-CM | POA: Diagnosis not present

## 2019-07-14 DIAGNOSIS — R52 Pain, unspecified: Secondary | ICD-10-CM

## 2019-07-14 DIAGNOSIS — Z0000 Encounter for general adult medical examination without abnormal findings: Secondary | ICD-10-CM

## 2019-07-14 DIAGNOSIS — R609 Edema, unspecified: Secondary | ICD-10-CM | POA: Diagnosis not present

## 2019-07-14 DIAGNOSIS — E1169 Type 2 diabetes mellitus with other specified complication: Secondary | ICD-10-CM | POA: Diagnosis not present

## 2019-07-14 DIAGNOSIS — M1711 Unilateral primary osteoarthritis, right knee: Secondary | ICD-10-CM

## 2019-07-14 DIAGNOSIS — N183 Chronic kidney disease, stage 3 unspecified: Secondary | ICD-10-CM

## 2019-07-14 DIAGNOSIS — M1712 Unilateral primary osteoarthritis, left knee: Secondary | ICD-10-CM

## 2019-07-14 LAB — LIPID PANEL
Cholesterol: 109 mg/dL (ref 0–200)
HDL: 45.1 mg/dL (ref >39.00)
LDL Cholesterol: 51 mg/dL (ref 0–99)
NonHDL: 63.99
Total CHOL/HDL Ratio: 2
Triglycerides: 67 mg/dL (ref 0.0–149.0)
VLDL: 13.4 mg/dL (ref 0.0–40.0)

## 2019-07-14 LAB — MICROALBUMIN / CREATININE URINE RATIO
Creatinine,U: 426.7 mg/dL
Microalb Creat Ratio: 1.6 mg/g (ref 0.0–30.0)
Microalb, Ur: 7 mg/dL — ABNORMAL HIGH (ref 0.0–1.9)

## 2019-07-14 LAB — COMPREHENSIVE METABOLIC PANEL
ALT: 9 U/L (ref 0–35)
AST: 10 U/L (ref 0–37)
Albumin: 3.7 g/dL (ref 3.5–5.2)
Alkaline Phosphatase: 56 U/L (ref 39–117)
BUN: 23 mg/dL (ref 6–23)
CO2: 28 mEq/L (ref 19–32)
Calcium: 8.8 mg/dL (ref 8.4–10.5)
Chloride: 103 mEq/L (ref 96–112)
Creatinine, Ser: 1.28 mg/dL — ABNORMAL HIGH (ref 0.40–1.20)
GFR: 41.25 mL/min — ABNORMAL LOW (ref >60.00)
Glucose, Bld: 125 mg/dL — ABNORMAL HIGH (ref 70–99)
Potassium: 4.1 mEq/L (ref 3.5–5.1)
Sodium: 139 mEq/L (ref 135–145)
Total Bilirubin: 0.8 mg/dL (ref 0.2–1.2)
Total Protein: 7 g/dL (ref 6.0–8.3)

## 2019-07-14 LAB — CBC
HCT: 35.3 % — ABNORMAL LOW (ref 36.0–46.0)
Hemoglobin: 11.6 g/dL — ABNORMAL LOW (ref 12.0–15.0)
MCHC: 33 g/dL (ref 30.0–36.0)
MCV: 88.1 fl (ref 78.0–100.0)
Platelets: 275 10*3/uL (ref 150.0–400.0)
RBC: 4 Mil/uL (ref 3.87–5.11)
RDW: 14.4 % (ref 11.5–15.5)
WBC: 7.9 10*3/uL (ref 4.0–10.5)

## 2019-07-14 LAB — TSH: TSH: 2.05 u[IU]/mL (ref 0.35–4.50)

## 2019-07-14 LAB — BRAIN NATRIURETIC PEPTIDE: Pro B Natriuretic peptide (BNP): 119 pg/mL — ABNORMAL HIGH (ref 0.0–100.0)

## 2019-07-14 LAB — T4, FREE: Free T4: 1.33 ng/dL (ref 0.60–1.60)

## 2019-07-14 MED ORDER — TRAMADOL HCL 50 MG PO TABS: 50.0000 mg | ORAL_TABLET | Freq: Every day | ORAL | 5 refills | Status: DC | PRN

## 2019-07-14 MED ORDER — BLOOD GLUCOSE METER KIT: PACK | 0 refills | Status: AC

## 2019-07-14 NOTE — Assessment & Plan Note (Signed)
BP at goal on coreg and enalapril and checking CMP and adjust as needed.

## 2019-07-14 NOTE — Assessment & Plan Note (Signed)
Needs renal function prior to meloxicam refill as she has had CKD stage 3 in past and no renal function in 2 years.

## 2019-07-14 NOTE — Patient Instructions (Addendum)
Medication Instructions:  TAKE LASIX 20 MG ONCE A FOR 3 DAYS  ONLY    THEN RESUME  LASIX  20 MG  3  TIMES A WEEK   *If you need a refill on your cardiac medications before your next appointment, please call your pharmacy*   Lab Work: NONE ORDERED  TODAY   If you have labs (blood work) drawn today and your tests are completely normal, you will receive your results only by: [name] MyChart Message (if you have MyChart) OR . A paper copy in the mail If you have any lab test that is abnormal or we need to change your treatment, we will call you to review the results.   Testing/Procedures: NONE ORDERED  TODAY   Follow-Up: At [facility], you and your health needs are our priority.  As part of our continuing mission to provide you with exceptional heart care, we have created designated Provider Care Teams.  These Care Teams include your primary Cardiologist (physician) and Advanced Practice Providers (APPs -  Physician Assistants and Nurse Practitioners) who all work together to provide you with the care you need, when you need it.  We recommend signing up for the patient portal called "MyChart".  Sign up information is provided on this After Visit Summary.  MyChart is used to connect with patients for Virtual Visits (Telemedicine).  Patients are able to view lab/test results, encounter notes, upcoming appointments, etc.  Non-urgent messages can be sent to your provider as well.   To learn more about what you can do with MyChart, go to [email or web address].    Your next appointment:   6 month(s)  The format for your next appointment:   In Person  Provider:    You may see  Tommye Standard, PA-C    Other Instructions

## 2019-07-14 NOTE — Progress Notes (Signed)
Subjective:   Patient ID: Katie Higgins, female    DOB: [birth year], 69 y.o.   MRN: [id number]  HPI Here for medicare wellness, no new complaints. Please see A/P for status and treatment of chronic medical problems.   HPI #2: Here for follow up chronic pain (with worsening pain in the feet, started with burning pain going up and down legs, tried tramadol and tylenol both of which helped the pain, overall improving gradually, swelling in her feet started after the pain) and diabetes (with new burning pain in feet, denies low or high sugars, seeing endo for management, most recent HgA1c at goal) and cholesterol (taking lovastatin and no recent lipid panel, denies side effects, denies chest pains or stroke symptoms).   Diet: DM since diabetic Physical activity: sedentary, more so in last 2-3 weeks Depression/mood screen: negative Hearing: intact to whispered voice with hearing aid left, right with moderate loss Visual acuity: grossly normal with lens,overdue for annual eye exam  ADLs: capable Fall risk: low Home safety: good Cognitive evaluation: intact to orientation, naming, recall and repetition EOL planning: adv directives discussed    Office Visit from 07/14/2019 in [place] at [facility] Total Score  0      I have personally reviewed and have noted 1. The patient's medical and social history - reviewed today no changes 2. Their use of alcohol, tobacco or illicit drugs 3. Their current medications and supplements 4. The patient's functional ability including ADL's, fall risks, home safety risks and hearing or visual impairment. 5. Diet and physical activities 6. Evidence for depression or mood disorders 7. Care team reviewed and updated  Patient Care Team: Hoyt Koch, MD as PCP - General (Internal Medicine) Renato Shin, MD as Consulting Physician (Endocrinology) Larey Dresser, MD as Consulting Physician (Cardiology) Lyndal Pulley, DO  (Sports Medicine) Past Medical History:  Diagnosis Date  . ALLERGIC RHINITIS   . CARDIOMYOPATHY    Nonischemic. Admitted in 3/11 with CHF exac, due to hyperthyroidism. LHC (03/11) showed clean coronaries with EF 40%. Myoview showed EF 38%. Echo was a technically difficult study with mild to moderately decreased EF, mild LVH, mild MR. Repeat echo (9/11) with EF 71-69%, mild diastolic dysfunction (grade I).  . CARPAL TUNNEL SYNDROME, LEFT, MILD   . CKD (chronic kidney disease), stage III   . Congestive heart failure, unspecified   . DIABETES MELLITUS, CONTROLLED   . DYSLIPIDEMIA   . Graves disease   . HYPERTENSION   . HYPERTHYROIDISM   . Obesity   . Osteoarthritis    esp L hip, low back  . Palpitations   . Premature ventricular contraction    Past Surgical History:  Procedure Laterality Date  . CARDIAC CATHETERIZATION  04/19/09  . CARDIAC CATHETERIZATION N/A 09/30/2015   Procedure: Left Heart Cath and Coronary Angiography;  Surgeon: Larey Dresser, MD;  Location: [facility] [facility];  Service: Cardiovascular;  Laterality: N/A;  . CHOLECYSTECTOMY    . remote hernia repair     Family History  Problem Relation Age of Onset  . Diabetes Mother   . Breast cancer Mother   . Cervical cancer Mother   . Kidney cancer Father        Kidney Cancer  . Stroke Father        CVA  . Supraventricular tachycardia Sister   . Asthma Unknown        family history   Review of Systems  Constitutional: Positive for activity  change.  HENT: Negative.   Eyes: Negative.   Respiratory: Negative for cough, chest tightness and shortness of breath.   Cardiovascular: Negative for chest pain, palpitations and leg swelling.  Gastrointestinal: Negative for abdominal distention, abdominal pain, constipation, diarrhea, nausea and vomiting.  Musculoskeletal: Positive for arthralgias, back pain and myalgias.  Skin: Negative.   Neurological: Negative.  Negative for dizziness, weakness and numbness.    Psychiatric/Behavioral: Negative.     Objective:  Physical Exam Constitutional:      Appearance: She is well-developed.  HENT:     Head: Normocephalic and atraumatic.  Cardiovascular:     Rate and Rhythm: Normal rate and regular rhythm.  Pulmonary:     Effort: Pulmonary effort is normal. No respiratory distress.     Breath sounds: Normal breath sounds. No wheezing or rales.  Abdominal:     General: Bowel sounds are normal. There is no distension.     Palpations: Abdomen is soft.     Tenderness: There is no abdominal tenderness. There is no rebound.  Musculoskeletal:     Cervical back: Normal range of motion.  Skin:    General: Skin is warm and dry.     Comments: See foot exam, some swelling in the feet and ankles  Neurological:     Mental Status: She is alert and oriented to person, place, and time.     Coordination: Coordination normal.     Vitals:   07/14/19 1250  BP: 132/84  Pulse: 84  Temp: 99 F (37.2 C)  TempSrc: Oral  SpO2: 95%  Height: 5\' 7"  (1.702 m)   This visit occurred during the SARS-CoV-2 public health emergency.  Safety protocols were in place, including screening questions prior to the visit, additional usage of staff PPE, and extensive cleaning of exam room while observing appropriate contact time as indicated for disinfecting solutions.   Assessment & Plan:

## 2019-07-14 NOTE — Assessment & Plan Note (Signed)
Weight is up and unclear if this is more weight or fluid. Checking BNP today.

## 2019-07-14 NOTE — Assessment & Plan Note (Signed)
She is concerned about levels due to recent swelling so checking TSH and T4 although levels normal about 2 months ago.

## 2019-07-14 NOTE — Assessment & Plan Note (Signed)
Checking lipid panel and adjust lovastatin as needed for LDL <70.

## 2019-07-14 NOTE — Assessment & Plan Note (Signed)
Will refill tramadol to fax to her mail order. Uses BID and not able to review as her mail order is in [place].

## 2019-07-14 NOTE — Assessment & Plan Note (Signed)
Checking BNP, no SOB with exertion or with lying flat. Did stop lasix before swelling resumed but suspect some of the swelling is due to lack of activity. Not currently on lasix. If needed may need dose reduction as she felt 20 mg 3 times weekly was making her dehydrated.

## 2019-07-14 NOTE — Assessment & Plan Note (Signed)
Flu shot due next season. Pneumonia complete. Shingrix counseled. Tetanus due 2027. Colonoscopy due declines. Mammogram due 2022, pap smear aged out. Counseled about sun safety and mole surveillance. Counseled about the dangers of distracted driving. Given 10 year screening recommendations.

## 2019-07-14 NOTE — Patient Instructions (Signed)
Health Maintenance, Female Adopting a healthy lifestyle and getting preventive care are important in promoting health and wellness. Ask your health care provider about:  The right schedule for you to have regular tests and exams.  Things you can do on your own to prevent diseases and keep yourself healthy. What should I know about diet, weight, and exercise? Eat a healthy diet   Eat a diet that includes plenty of vegetables, fruits, low-fat dairy products, and lean protein.  Do not eat a lot of foods that are high in solid fats, added sugars, or sodium. Maintain a healthy weight Body mass index (BMI) is used to identify weight problems. It estimates body fat based on height and weight. Your health care provider can help determine your BMI and help you achieve or maintain a healthy weight. Get regular exercise Get regular exercise. This is one of the most important things you can do for your health. Most adults should:  Exercise for at least 150 minutes each week. The exercise should increase your heart rate and make you sweat (moderate-intensity exercise).  Do strengthening exercises at least twice a week. This is in addition to the moderate-intensity exercise.  Spend less time sitting. Even light physical activity can be beneficial. Watch cholesterol and blood lipids Have your blood tested for lipids and cholesterol at 70 years of age, then have this test every 5 years. Have your cholesterol levels checked more often if:  Your lipid or cholesterol levels are high.  You are older than 70 years of age.  You are at high risk for heart disease. What should I know about cancer screening? Depending on your health history and family history, you may need to have cancer screening at various ages. This may include screening for:  Breast cancer.  Cervical cancer.  Colorectal cancer.  Skin cancer.  Lung cancer. What should I know about heart disease, diabetes, and high blood  pressure? Blood pressure and heart disease  High blood pressure causes heart disease and increases the risk of stroke. This is more likely to develop in people who have high blood pressure readings, are of African descent, or are overweight.  Have your blood pressure checked: ? Every 3-5 years if you are 18-39 years of age. ? Every year if you are 40 years old or older. Diabetes Have regular diabetes screenings. This checks your fasting blood sugar level. Have the screening done:  Once every three years after age 40 if you are at a normal weight and have a low risk for diabetes.  More often and at a younger age if you are overweight or have a high risk for diabetes. What should I know about preventing infection? Hepatitis B If you have a higher risk for hepatitis B, you should be screened for this virus. Talk with your health care provider to find out if you are at risk for hepatitis B infection. Hepatitis C Testing is recommended for:  Everyone born from 1945 through 1965.  Anyone with known risk factors for hepatitis C. Sexually transmitted infections (STIs)  Get screened for STIs, including gonorrhea and chlamydia, if: ? You are sexually active and are younger than 70 years of age. ? You are older than 70 years of age and your health care provider tells you that you are at risk for this type of infection. ? Your sexual activity has changed since you were last screened, and you are at increased risk for chlamydia or gonorrhea. Ask your health care provider if   you are at risk.  Ask your health care provider about whether you are at high risk for HIV. Your health care provider may recommend a prescription medicine to help prevent HIV infection. If you choose to take medicine to prevent HIV, you should first get tested for HIV. You should then be tested every 3 months for as long as you are taking the medicine. Pregnancy  If you are about to stop having your period (premenopausal) and  you may become pregnant, seek counseling before you get pregnant.  Take 400 to 800 micrograms (mcg) of folic acid every day if you become pregnant.  Ask for birth control (contraception) if you want to prevent pregnancy. Osteoporosis and menopause Osteoporosis is a disease in which the bones lose minerals and strength with aging. This can result in bone fractures. If you are 65 years old or older, or if you are at risk for osteoporosis and fractures, ask your health care provider if you should:  Be screened for bone loss.  Take a calcium or vitamin D supplement to lower your risk of fractures.  Be given hormone replacement therapy (HRT) to treat symptoms of menopause. Follow these instructions at home: Lifestyle  Do not use any products that contain nicotine or tobacco, such as cigarettes, e-cigarettes, and chewing tobacco. If you need help quitting, ask your health care provider.  Do not use street drugs.  Do not share needles.  Ask your health care provider for help if you need support or information about quitting drugs. Alcohol use  Do not drink alcohol if: ? Your health care provider tells you not to drink. ? You are pregnant, may be pregnant, or are planning to become pregnant.  If you drink alcohol: ? Limit how much you use to 0-1 drink a day. ? Limit intake if you are breastfeeding.  Be aware of how much alcohol is in your drink. In the [place] one drink equals one 12 oz bottle of beer (355 mL), one 5 oz glass of wine (148 mL), or one 1 oz glass of hard liquor (44 mL). General instructions  Schedule regular health, dental, and eye exams.  Stay current with your vaccines.  Tell your health care provider if: ? You often feel depressed. ? You have ever been abused or do not feel safe at home. Summary  Adopting a healthy lifestyle and getting preventive care are important in promoting health and wellness.  Follow your health care provider's instructions about healthy  diet, exercising, and getting tested or screened for diseases.  Follow your health care provider's instructions on monitoring your cholesterol and blood pressure. This information is not intended to replace advice given to you by your health care provider. Make sure you discuss any questions you have with your health care provider. Document Revised: 01/16/2018 Document Reviewed: 01/16/2018 Elsevier Patient Education  2020 Elsevier Inc.  

## 2019-07-15 ENCOUNTER — Encounter: Payer: Self-pay | Admitting: Internal Medicine

## 2019-07-15 MED ORDER — MELOXICAM 15 MG PO TABS: 15.0000 mg | ORAL_TABLET | Freq: Every day | ORAL | 1 refills | Status: DC

## 2019-07-23 DIAGNOSIS — R0602 Shortness of breath: Secondary | ICD-10-CM

## 2019-07-23 DIAGNOSIS — I5032 Chronic diastolic (congestive) heart failure: Secondary | ICD-10-CM

## 2019-07-24 MED ORDER — CARVEDILOL 25 MG PO TABS: 25.0000 mg | ORAL_TABLET | Freq: Two times a day (BID) | ORAL | 3 refills | Status: DC

## 2019-09-03 ENCOUNTER — Encounter: Payer: Self-pay | Admitting: Internal Medicine

## 2019-09-04 MED ORDER — ONDANSETRON HCL 8 MG PO TABS: 8.0000 mg | ORAL_TABLET | Freq: Three times a day (TID) | ORAL | 0 refills | Status: DC | PRN

## 2019-10-08 ENCOUNTER — Encounter: Payer: Self-pay | Admitting: Internal Medicine

## 2019-10-10 NOTE — Telephone Encounter (Signed)
I have received form. It has been completed &Placed in Alben Spittle, FNP box to review and sign.

## 2019-11-13 ENCOUNTER — Ambulatory Visit: Payer: Medicare Other | Admitting: Endocrinology

## 2019-12-01 ENCOUNTER — Ambulatory Visit: Payer: Medicare Other | Admitting: Endocrinology

## 2019-12-23 ENCOUNTER — Encounter: Payer: Self-pay | Admitting: Internal Medicine

## 2019-12-24 ENCOUNTER — Other Ambulatory Visit: Payer: Self-pay

## 2019-12-24 MED ORDER — MELOXICAM 15 MG PO TABS: 15.0000 mg | ORAL_TABLET | Freq: Every day | ORAL | 1 refills | Status: DC

## 2020-01-02 ENCOUNTER — Encounter: Payer: Self-pay | Admitting: Internal Medicine

## 2020-01-02 DIAGNOSIS — M1712 Unilateral primary osteoarthritis, left knee: Secondary | ICD-10-CM

## 2020-01-02 DIAGNOSIS — I5032 Chronic diastolic (congestive) heart failure: Secondary | ICD-10-CM

## 2020-01-02 DIAGNOSIS — I493 Ventricular premature depolarization: Secondary | ICD-10-CM

## 2020-01-02 DIAGNOSIS — M1711 Unilateral primary osteoarthritis, right knee: Secondary | ICD-10-CM

## 2020-01-05 ENCOUNTER — Encounter: Payer: Self-pay | Admitting: Endocrinology

## 2020-01-05 ENCOUNTER — Encounter: Payer: Self-pay | Admitting: Internal Medicine

## 2020-01-05 DIAGNOSIS — E041 Nontoxic single thyroid nodule: Secondary | ICD-10-CM

## 2020-01-05 MED ORDER — ENALAPRIL MALEATE 10 MG PO TABS: 10.0000 mg | ORAL_TABLET | Freq: Every evening | ORAL | 1 refills | Status: DC

## 2020-01-06 MED ORDER — METHIMAZOLE 5 MG PO TABS: 5.0000 mg | ORAL_TABLET | ORAL | 1 refills | Status: DC

## 2020-01-07 MED ORDER — TRAMADOL HCL 50 MG PO TABS: 50.0000 mg | ORAL_TABLET | Freq: Every day | ORAL | 5 refills | Status: DC | PRN

## 2020-01-11 NOTE — Progress Notes (Signed)
Cardiology Office Note Date:  01/11/2020  Patient ID:  Katie, Higgins 1950-09-22, MRN [id number] PCP:  Hoyt Koch, MD  Cardiologist:  Dr. [name] (2017) EP: Dr. [name] Endo: Dr. [name]    Chief Complaint:  [id number]  History of Present Illness: Katie RAPAPORTis a 69y.o. female with history of hyperthyroidism, PVCs, DM, HLD, HTN, CKD (III), morbid obesity. 2011 w/u noted CM > LHC w/normal coronaries.  CM suspect 2/2 hyperthyroidism, and did improve with therapy.  She was referred to Dr. ARayann Hemanwho felt with minimial symptoms from PVCs and imporvement of her EF, pt preference to avoid invasive procedure, no plans for EP/ablation  She saw [name] NP 2018, described as essentially wheelchair bound, no symptoms of PVCs, planned to update her echo, if remained preserved LVEF to continue medical therapy. LVEF was 55-60% She has seen Dr. ARayann Hemanannually, most recently via tele health June 2020, doing well, no changes were made, planned for annual APP visit.  I saw her 07/14/2019 She is accompanied by her son today.  She is in a wheelchair, but reports that she is able to ambulates some.  She has bad OA of her hips and knees that is why she uses a wheelchair.  She says she can stand long enough to cook, wash up, able to do her ADLs independently.  She denies any palpitations or awareness of any PVCs if she is having any.  She will rarely get a fleeting/momentary "pang" in the center of her chest.  maybe the last was 6 mo or so ago.  No rest SOB, no DOE at her level of exertinal.  She denies symptoms of orthopnea or PND.   No dizzy spells, near syncope or syncope.  She has noticed swelling in her feet for the last 3 weeks, it is nearly if not completely resolved in the AM, but then some days uncomfortably swollen.  She notes this is associated with an aching discomfort to the very lateral edges of her feet, like she has been walking on the edge of her feet, or has a "rock  bruise"  Edema sounded more of a dependent edema, Recommend she take her lasix QD for 3 days then resume 3d/week, discussed minimizing sodium and elevating feet when seated.  If her lasix requirements remain increased, will update her echo, she is alsked to let uKoreaknow. No PVCs on her EKG.  TODAY She is accompanied by her son again today She lives in her own home, essentially wheelchair bound for ambulating 2/2 her OA and terrible knee pain, but once in the kitchen stands to cook, do dishes, etc  Able to care for herself. She denies any CP, palpitations or SOB, no cardiac awareness. She denies any symptoms of PND or orthopnea. Tolerating her medicines well No recurrent edema that she noticed  Past Medical History:  Diagnosis Date  . ALLERGIC RHINITIS   . CARDIOMYOPATHY    Nonischemic. Admitted in 3/11 with CHF exac, due to hyperthyroidism. LHC (03/11) showed clean coronaries with EF 40%. Myoview showed EF 38%. Echo was a technically difficult study with mild to moderately decreased EF, mild LVH, mild MR. Repeat echo (9/11) with EF 509-32% mild diastolic dysfunction (grade I).  . CARPAL TUNNEL SYNDROME, LEFT, MILD   . CKD (chronic kidney disease), stage III   . Congestive heart failure, unspecified   . DIABETES MELLITUS, CONTROLLED   . DYSLIPIDEMIA   . Graves disease   . HYPERTENSION   .  HYPERTHYROIDISM   . Obesity   . Osteoarthritis    esp L hip, low back  . Palpitations   . Premature ventricular contraction     Past Surgical History:  Procedure Laterality Date  . CARDIAC CATHETERIZATION  04/19/09  . CARDIAC CATHETERIZATION N/A 09/30/2015   Procedure: Left Heart Cath and Coronary Angiography;  Surgeon: Larey Dresser, MD;  Location: [place] [facility];  Service: Cardiovascular;  Laterality: N/A;  . CHOLECYSTECTOMY    . remote hernia repair      Current Outpatient Medications  Medication Sig Dispense Refill  . acetaminophen (TYLENOL) 500 MG tablet Take 500 mg by mouth every  6 (six) hours as needed for moderate pain.    [name] aspirin 81 MG tablet Take 81 mg by mouth daily.      . blood glucose meter kit and supplies Dispense based on patient and insurance preference. Use up to four times daily as directed. (FOR ICD-10 E10.9, E11.9). 1 each 0  . carvedilol (COREG) 25 MG tablet Take 1 tablet (25 mg total) by mouth 2 (two) times daily. 180 tablet 3  . colchicine 0.6 MG tablet Take 0.6 mg by mouth daily as needed. As directed    . cyclobenzaprine (FLEXERIL) 5 MG tablet Take 1 tablet (5 mg total) by mouth 3 (three) times daily as needed for muscle spasms. 30 tablet 0  . enalapril (VASOTEC) 10 MG tablet Take 1 tablet (10 mg total) by mouth every evening. 90 tablet 1  . furosemide (LASIX) 20 MG tablet Take 1 tablet (20 mg total) by mouth 3 (three) times a week. 45 tablet 3  . glucose blood (FREESTYLE TEST STRIPS) test strip Use to check blood sugars twice a day 300 each 1  . lovastatin (MEVACOR) 10 MG tablet Take 1 tablet (10 mg total) by mouth at bedtime. 90 tablet 3  . meloxicam (MOBIC) 15 MG tablet Take 1 tablet (15 mg total) by mouth daily. 90 tablet 1  . methimazole (TAPAZOLE) 5 MG tablet Take 1 tablet (5 mg total) by mouth 4 (four) times a week. 48 tablet 1  . Multiple Vitamin (MULTIVITAMIN) tablet Take 1 tablet by mouth daily.      . ondansetron (ZOFRAN) 8 MG tablet Take 1 tablet (8 mg total) by mouth every 8 (eight) hours as needed for nausea or vomiting. 10 tablet 0  . traMADol (ULTRAM) 50 MG tablet Take 1-2 tablets (50-100 mg total) by mouth daily as needed (pain). 60 tablet 5   No current facility-administered medications for this visit.    Allergies:   Latex, Wool alcohol [lanolin], and Amitriptyline   Social History:  The patient  reports that she has never smoked. She has never used smokeless tobacco. She reports that she does not drink alcohol and does not use drugs.   Family History:  The patient's family history includes Asthma in her unknown relative;  Breast cancer in her mother; Cervical cancer in her mother; Diabetes in her mother; Kidney cancer in her father; Stroke in her father; Supraventricular tachycardia in her sister.  ROS:  Please see the history of present illness.  All other systems are reviewed and otherwise negative.   PHYSICAL EXAM:  VS:  There were no vitals taken for this visit. BMI: There is no height or weight on file to calculate BMI. Well nourished, well developed, in no acute distress  HEENT: normocephalic, atraumatic  Neck: no JVD, carotid bruits or masses Cardiac:  RRR; no significant murmurs, no rubs, or  gallops, no extrasystoles noted with prolonged auscultation Lungs:  CTA b/l, no wheezing, rhonchi or rales  Abd: soft, nontender,  she has a very large pannus MS: no deformity or atrophy Ext: ne edema b/l Skin: warm and dry, no rash Neuro:  No gross deficits appreciated Psych: euthymic mood, full affect   EKG: not done today  05/11/2016: TTE Study Conclusions  - Left ventricle: The cavity size was normal. Systolic function was  normal. The estimated ejection fraction was in the range of 55%  to 60%. Wall motion was normal; there were no regional wall  motion abnormalities. Doppler parameters are consistent with  abnormal left ventricular relaxation (grade 1 diastolic  dysfunction). Doppler parameters are consistent with elevated  ventricular end-diastolic filling pressure. Mean left atrial  pressure appears to be normal at rest.    09/30/2015: LHC No obstructive coronary disease.  Small caliber distal LAD but similar to prior study.    Recent Labs: 07/14/2019: ALT 9; BUN 23; Creatinine, Ser 1.28; Hemoglobin 11.6; Platelets 275.0; Potassium 4.1; Pro B Natriuretic peptide (BNP) 119.0; Sodium 139; TSH 2.05  07/14/2019: Cholesterol 109; HDL 45.10; LDL Cholesterol 51; Total CHOL/HDL Ratio 2; Triglycerides 67.0; VLDL 13.4   CrCl cannot be calculated (Patient's most recent lab result is older than  the maximum 21 days allowed.).   Wt Readings from Last 3 Encounters:  07/14/19 286 lb (129.7 kg)  05/15/19 295 lb (133.8 kg)  11/14/18 284 lb (128.8 kg)     Other studies reviewed: Additional studies/records reviewed today include: summarized above  ASSESSMENT AND PLAN:  1. PVCs     No symptoms to suggest any significant burden.     None noted today with prolonged auscultation  2. NICM     Recovered LVEF, suspect 2/2 hyperthyriodism     No exam findings or symptoms to suggest change  3. HTN     Looks good, no changes  We discussed seated exercises she may be able to do,   Disposition: will plan to see her in 76mo if she continues to do well, she is instructed that she can move it out to a year if she would like.  Current medicines are reviewed at length with the patient today.  The patient did not have any concerns regarding medicines  Signed, RTommye Standard PA-C 01/11/2020 9:25 AM     [facility] [address] [place] [phone])  [phone](fax)

## 2020-01-12 ENCOUNTER — Ambulatory Visit (INDEPENDENT_AMBULATORY_CARE_PROVIDER_SITE_OTHER): Payer: Medicare Other | Admitting: Physician Assistant

## 2020-01-12 ENCOUNTER — Other Ambulatory Visit: Payer: Self-pay

## 2020-01-12 ENCOUNTER — Encounter: Payer: Self-pay | Admitting: Physician Assistant

## 2020-01-12 VITALS — BP 128/70 | HR 64 | Ht 67.0 in | Wt 283.0 lb

## 2020-01-12 DIAGNOSIS — I493 Ventricular premature depolarization: Secondary | ICD-10-CM | POA: Diagnosis not present

## 2020-01-12 DIAGNOSIS — I10 Essential (primary) hypertension: Secondary | ICD-10-CM | POA: Diagnosis not present

## 2020-01-12 DIAGNOSIS — I428 Other cardiomyopathies: Secondary | ICD-10-CM

## 2020-01-12 NOTE — Patient Instructions (Signed)
Medication Instructions:  ? ?Your physician recommends that you continue on your current medications as directed. Please refer to the Current Medication list given to you today. ? ?*If you need a refill on your cardiac medications before your next appointment, please call your pharmacy* ? ? ?Lab Work: NONE ORDERED  TODAY ? ? ?If you have labs (blood work) drawn today and your tests are completely normal, you will receive your results only by: ?MyChart Message (if you have MyChart) OR ?A paper copy in the mail ?If you have any lab test that is abnormal or we need to change your treatment, we will call you to review the results. ? ? ?Testing/Procedures: NONE ORDERED  TODAY ? ? ? ?Follow-Up: ?At [facility], you and your health needs are our priority.  As part of our continuing mission to provide you with exceptional heart care, we have created designated Provider Care Teams.  These Care Teams include your primary Cardiologist (physician) and Advanced Practice Providers (APPs -  Physician Assistants and Nurse Practitioners) who all work together to provide you with the care you need, when you need it. ? ?We recommend signing up for the patient portal called "MyChart".  Sign up information is provided on this After Visit Summary.  MyChart is used to connect with patients for Virtual Visits (Telemedicine).  Patients are able to view lab/test results, encounter notes, upcoming appointments, etc.  Non-urgent messages can be sent to your provider as well.   ?To learn more about what you can do with MyChart, go to [email or web address].   ? ?Your next appointment:   ?6 month(s) ? ?The format for your next appointment:   ?In Person ? ?Provider:   ?You may see : Renee Ursuy, PA-C ? ? ? ?Other Instructions ?  ?

## 2020-01-17 ENCOUNTER — Encounter: Payer: Self-pay | Admitting: Endocrinology

## 2020-01-17 DIAGNOSIS — E041 Nontoxic single thyroid nodule: Secondary | ICD-10-CM

## 2020-01-19 MED ORDER — METHIMAZOLE 5 MG PO TABS: 5.0000 mg | ORAL_TABLET | ORAL | 1 refills | Status: DC

## 2020-01-22 ENCOUNTER — Ambulatory Visit (INDEPENDENT_AMBULATORY_CARE_PROVIDER_SITE_OTHER): Payer: Medicare Other | Admitting: Endocrinology

## 2020-01-22 ENCOUNTER — Encounter: Payer: Self-pay | Admitting: Endocrinology

## 2020-01-22 ENCOUNTER — Other Ambulatory Visit: Payer: Self-pay

## 2020-01-22 VITALS — BP 140/60 | HR 76

## 2020-01-22 DIAGNOSIS — E0580 Other thyrotoxicosis without thyrotoxic crisis or storm: Secondary | ICD-10-CM

## 2020-01-22 DIAGNOSIS — Z23 Encounter for immunization: Secondary | ICD-10-CM

## 2020-01-22 LAB — TSH: TSH: 2.43 u[IU]/mL (ref 0.35–4.50)

## 2020-01-22 LAB — T4, FREE: Free T4: 0.85 ng/dL (ref 0.60–1.60)

## 2020-01-22 NOTE — Progress Notes (Signed)
Subjective:    Patient ID: Katie Higgins, female    DOB: 1950/11/20, 69 y.o.   MRN: [id number]  HPI Pt returns for f/u of hyperthyroidism (due to [place] dz; dx'ed 2010; US showed heterogeneity of texture and one small nodule; she has been unable to safely d/c tapazole for RAI rx, due to CHF).  pt states she feels well in general.  Specifically, she denies palpitations and anxiety.  She takes tapazole as rx'ed.   Past Medical History:  Diagnosis Date  . ALLERGIC RHINITIS   . CARDIOMYOPATHY    Nonischemic. Admitted in 3/11 with CHF exac, due to hyperthyroidism. LHC (03/11) showed clean coronaries with EF 40%. Myoview showed EF 38%. Echo was a technically difficult study with mild to moderately decreased EF, mild LVH, mild MR. Repeat echo (9/11) with EF 21-19%, mild diastolic dysfunction (grade I).  . CARPAL TUNNEL SYNDROME, LEFT, MILD   . CKD (chronic kidney disease), stage III ([place])   . Congestive heart failure, unspecified   . DIABETES MELLITUS, CONTROLLED   . DYSLIPIDEMIA   . Graves disease   . HYPERTENSION   . HYPERTHYROIDISM   . Obesity   . Osteoarthritis    esp L hip, low back  . Palpitations   . Premature ventricular contraction     Past Surgical History:  Procedure Laterality Date  . CARDIAC CATHETERIZATION  04/19/09  . CARDIAC CATHETERIZATION N/A 09/30/2015   Procedure: Left Heart Cath and Coronary Angiography;  Surgeon: Larey Dresser, MD;  Location: [facility];  Service: Cardiovascular;  Laterality: N/A;  . CHOLECYSTECTOMY    . remote hernia repair      Social History   Socioeconomic History  . Marital status: Widowed    Spouse name: Not on file  . Number of children: Not on file  . Years of education: Not on file  . Highest education level: Not on file  Occupational History  . Not on file  Tobacco Use  . Smoking status: Never Smoker  . Smokeless tobacco: Never Used  Vaping Use  . Vaping Use: Never used  Substance and Sexual Activity  . Alcohol  use: No  . Drug use: No  . Sexual activity: Not on file  Other Topics Concern  . Not on file  Social History Narrative   Lives in [place] with her son.   Widowed since 1992.   She is very sedentary, uses a wheelchair when out of the house.   Ambulatory in home with cane.   Social Determinants of Health   Financial Resource Strain: Not on file  Food Insecurity: Not on file  Transportation Needs: Not on file  Physical Activity: Not on file  Stress: Not on file  Social Connections: Not on file  Intimate Partner Violence: Not on file    Current Outpatient Medications on File Prior to Visit  Medication Sig Dispense Refill  . acetaminophen (TYLENOL) 500 MG tablet Take 500 mg by mouth every 6 (six) hours as needed for moderate pain.    [name] aspirin 81 MG tablet Take 81 mg by mouth daily.    . blood glucose meter kit and supplies Dispense based on patient and insurance preference. Use up to four times daily as directed. (FOR ICD-10 E10.9, E11.9). 1 each 0  . carvedilol (COREG) 25 MG tablet Take 1 tablet (25 mg total) by mouth 2 (two) times daily. 180 tablet 3  . colchicine 0.6 MG tablet Take 0.6 mg by mouth daily as needed. As  directed    . cyclobenzaprine (FLEXERIL) 5 MG tablet Take 1 tablet (5 mg total) by mouth 3 (three) times daily as needed for muscle spasms. 30 tablet 0  . enalapril (VASOTEC) 10 MG tablet Take 1 tablet (10 mg total) by mouth every evening. 90 tablet 1  . furosemide (LASIX) 20 MG tablet Take 1 tablet (20 mg total) by mouth 3 (three) times a week. 45 tablet 3  . glucose blood (FREESTYLE TEST STRIPS) test strip Use to check blood sugars twice a day 300 each 1  . lovastatin (MEVACOR) 10 MG tablet Take 1 tablet (10 mg total) by mouth at bedtime. 90 tablet 3  . meloxicam (MOBIC) 15 MG tablet Take 1 tablet (15 mg total) by mouth daily. 90 tablet 1  . methimazole (TAPAZOLE) 5 MG tablet Take 1 tablet (5 mg total) by mouth 4 (four) times a week. 48 tablet 1  . Multiple  Vitamin (MULTIVITAMIN) tablet Take 1 tablet by mouth daily.    . ondansetron (ZOFRAN) 8 MG tablet Take 1 tablet (8 mg total) by mouth every 8 (eight) hours as needed for nausea or vomiting. 10 tablet 0  . traMADol (ULTRAM) 50 MG tablet Take 1-2 tablets (50-100 mg total) by mouth daily as needed (pain). 60 tablet 5   No current facility-administered medications on file prior to visit.    Allergies  Allergen Reactions  . Latex Rash  . Wool Alcohol [Lanolin] Rash  . Amitriptyline Other (See Comments)    Leg cramps, trimmers     Family History  Problem Relation Age of Onset  . Diabetes Mother   . Breast cancer Mother   . Cervical cancer Mother   . Kidney cancer Father        Kidney Cancer  . Stroke Father        CVA  . Supraventricular tachycardia Sister   . Asthma Unknown        family history    BP 140/60   Pulse 76   SpO2 99%    Review of Systems Denies fever.      Objective:   Physical Exam VITAL SIGNS:  See vs page GENERAL: no distress NECK: There is no palpable thyroid enlargement.  No thyroid nodule is palpable.  No palpable lymphadenopathy at the anterior neck.    Lab Results  Component Value Date   TSH 2.43 01/22/2020       Assessment & Plan:  Hyperthyroidism: well-controlled.  Please continue the same tapazole.

## 2020-01-22 NOTE — Patient Instructions (Signed)
Blood tests are requested for you today.  We'll let you know about the results.  If ever you have fever while taking methimazole, stop it and call us, even if the reason is obvious, because of the risk of a rare side-effect. It is best to never miss the medication.  However, if you do miss it, next best is to double up the next time.   Please come back for a follow-up appointment in 6 months.

## 2020-03-17 ENCOUNTER — Encounter (HOSPITAL_COMMUNITY): Payer: Self-pay | Admitting: Emergency Medicine

## 2020-03-17 ENCOUNTER — Ambulatory Visit (INDEPENDENT_AMBULATORY_CARE_PROVIDER_SITE_OTHER): Payer: Medicare Other

## 2020-03-17 ENCOUNTER — Encounter (HOSPITAL_COMMUNITY): Payer: Self-pay

## 2020-03-17 ENCOUNTER — Emergency Department (HOSPITAL_COMMUNITY): Payer: Medicare Other

## 2020-03-17 ENCOUNTER — Ambulatory Visit (HOSPITAL_COMMUNITY): Payer: Medicare Other

## 2020-03-17 ENCOUNTER — Ambulatory Visit (INDEPENDENT_AMBULATORY_CARE_PROVIDER_SITE_OTHER)
Admission: EM | Admit: 2020-03-17 | Discharge: 2020-03-17 | Disposition: A | Discharge status: Home / Self Care | Payer: Medicare Other | Source: Home / Self Care | Attending: Family Medicine | Admitting: Family Medicine

## 2020-03-17 ENCOUNTER — Emergency Department (HOSPITAL_COMMUNITY)
Admission: EM | Admit: 2020-03-17 | Discharge: 2020-03-17 | Disposition: A | Discharge status: Home / Self Care | Payer: Medicare Other | Attending: Emergency Medicine | Admitting: Emergency Medicine

## 2020-03-17 ENCOUNTER — Other Ambulatory Visit: Payer: Self-pay

## 2020-03-17 DIAGNOSIS — Z79899 Other long term (current) drug therapy: Secondary | ICD-10-CM | POA: Insufficient documentation

## 2020-03-17 DIAGNOSIS — I130 Hypertensive heart and chronic kidney disease with heart failure and stage 1 through stage 4 chronic kidney disease, or unspecified chronic kidney disease: Secondary | ICD-10-CM | POA: Insufficient documentation

## 2020-03-17 DIAGNOSIS — Z85828 Personal history of other malignant neoplasm of skin: Secondary | ICD-10-CM | POA: Diagnosis not present

## 2020-03-17 DIAGNOSIS — R0602 Shortness of breath: Secondary | ICD-10-CM

## 2020-03-17 DIAGNOSIS — R0789 Other chest pain: Secondary | ICD-10-CM | POA: Insufficient documentation

## 2020-03-17 DIAGNOSIS — U071 COVID-19: Secondary | ICD-10-CM | POA: Insufficient documentation

## 2020-03-17 DIAGNOSIS — N183 Chronic kidney disease, stage 3 unspecified: Secondary | ICD-10-CM | POA: Diagnosis not present

## 2020-03-17 DIAGNOSIS — I509 Heart failure, unspecified: Secondary | ICD-10-CM | POA: Insufficient documentation

## 2020-03-17 DIAGNOSIS — E1122 Type 2 diabetes mellitus with diabetic chronic kidney disease: Secondary | ICD-10-CM | POA: Diagnosis not present

## 2020-03-17 DIAGNOSIS — R5383 Other fatigue: Secondary | ICD-10-CM

## 2020-03-17 DIAGNOSIS — Z7982 Long term (current) use of aspirin: Secondary | ICD-10-CM | POA: Insufficient documentation

## 2020-03-17 DIAGNOSIS — I2699 Other pulmonary embolism without acute cor pulmonale: Secondary | ICD-10-CM | POA: Diagnosis not present

## 2020-03-17 DIAGNOSIS — Z91040 Latex allergy status: Secondary | ICD-10-CM | POA: Insufficient documentation

## 2020-03-17 LAB — RESP PANEL BY RT-PCR (FLU A&B, COVID) ARPGX2
Influenza A by PCR: NEGATIVE
Influenza B by PCR: NEGATIVE
SARS Coronavirus 2 by RT PCR: POSITIVE — AB

## 2020-03-17 LAB — CBC
HCT: 39.2 % (ref 36.0–46.0)
Hemoglobin: 12.3 g/dL (ref 12.0–15.0)
MCH: 28.2 pg (ref 26.0–34.0)
MCHC: 31.4 g/dL (ref 30.0–36.0)
MCV: 89.9 fL (ref 80.0–100.0)
Platelets: 239 10*3/uL (ref 150–400)
RBC: 4.36 MIL/uL (ref 3.87–5.11)
RDW: 13.4 % (ref 11.5–15.5)
WBC: 5.2 10*3/uL (ref 4.0–10.5)
nRBC: 0 % (ref 0.0–0.2)

## 2020-03-17 LAB — COMPREHENSIVE METABOLIC PANEL
ALT: 29 U/L (ref 0–44)
AST: 32 U/L (ref 15–41)
Albumin: 3.5 g/dL (ref 3.5–5.0)
Alkaline Phosphatase: 68 U/L (ref 38–126)
Anion gap: 16 — ABNORMAL HIGH (ref 5–15)
BUN: 16 mg/dL (ref 8–23)
CO2: 25 mmol/L (ref 22–32)
Calcium: 8.4 mg/dL — ABNORMAL LOW (ref 8.9–10.3)
Chloride: 104 mmol/L (ref 98–111)
Creatinine, Ser: 1.1 mg/dL — ABNORMAL HIGH (ref 0.44–1.00)
GFR, Estimated: 54 mL/min — ABNORMAL LOW (ref >60)
Glucose, Bld: 113 mg/dL — ABNORMAL HIGH (ref 70–99)
Potassium: 3.5 mmol/L (ref 3.5–5.1)
Sodium: 145 mmol/L (ref 135–145)
Total Bilirubin: 1.4 mg/dL — ABNORMAL HIGH (ref 0.3–1.2)
Total Protein: 7 g/dL (ref 6.5–8.1)

## 2020-03-17 LAB — TROPONIN I (HIGH SENSITIVITY): Troponin I (High Sensitivity): 8 ng/L (ref <18)

## 2020-03-17 LAB — D-DIMER, QUANTITATIVE: D-Dimer, Quant: 1.53 ug/mL-FEU — ABNORMAL HIGH (ref 0.00–0.50)

## 2020-03-17 LAB — BRAIN NATRIURETIC PEPTIDE: B Natriuretic Peptide: 113.2 pg/mL — ABNORMAL HIGH (ref 0.0–100.0)

## 2020-03-17 MED ORDER — IOHEXOL 350 MG/ML SOLN
100.0000 mL | Freq: Once | INTRAVENOUS | Status: AC | PRN
  Administered 2020-03-17: 100 mL via INTRAVENOUS

## 2020-03-17 NOTE — Discharge Instructions (Signed)
Your work-up today confirmed COVID-19 as the cause of your upper respiratory symptoms and your shortness of breath. Your CT scan did not show any evidence of blood clot and the labs and work-up did not show convincing evidence of bacterial pneumonia. Your oxygen saturations were reassuring and we feel you are safe for discharge home. Please rest and stay hydrated and follow-up with your primary doctor. Please continue your home medication regimen otherwise. If any symptoms change or worsen, please return to the nearest emergency department.

## 2020-03-17 NOTE — ED Triage Notes (Signed)
Patient here for evaluation of worsening shortness of breath that started approximately one week ago.

## 2020-03-17 NOTE — ED Triage Notes (Signed)
Pt reported SHOB at check in. Pt's SPO2 checked 95% on RA. No acute distress noted

## 2020-03-17 NOTE — ED Notes (Signed)
Patient is being discharged from the Urgent Care and sent to the Emergency Department via POV . Per Vanessa Kick, MD, patient is in need of higher level of care due to SOB & elevated D-Dimer . Patient is aware and verbalizes understanding of plan of care.  Vitals:   03/17/20 1244  BP: 136/77  Pulse: 75  Resp: (!) 25  Temp: 98 F (36.7 C)  SpO2: 96%

## 2020-03-17 NOTE — ED Provider Notes (Signed)
[facility] EMERGENCY DEPARTMENT Provider Note   CSN: [id number] Arrival date & time: 03/17/20  1525     History Chief Complaint  Patient presents with  . Shortness of Breath    Katie Higgins is a 70 y.o. female.  The history is provided by the patient and medical records. No language interpreter was used.  Shortness of Breath Severity:  Moderate Onset quality:  Gradual Duration:  1 week Timing:  Constant Progression:  Worsening Chronicity:  New Context: URI   Relieved by:  Nothing Worsened by:  Exertion Ineffective treatments:  None tried Associated symptoms: cough   Associated symptoms: no abdominal pain, no chest pain, no diaphoresis, no fever, no headaches, no neck pain, no rash, no vomiting and no wheezing   Risk factors: obesity        Past Medical History:  Diagnosis Date  . ALLERGIC RHINITIS   . CARDIOMYOPATHY    Nonischemic. Admitted in 3/11 with CHF exac, due to hyperthyroidism. LHC (03/11) showed clean coronaries with EF 40%. Myoview showed EF 38%. Echo was a technically difficult study with mild to moderately decreased EF, mild LVH, mild MR. Repeat echo (9/11) with EF 74-16%, mild diastolic dysfunction (grade I).  . CARPAL TUNNEL SYNDROME, LEFT, MILD   . CKD (chronic kidney disease), stage III ([place])   . Congestive heart failure, unspecified   . DIABETES MELLITUS, CONTROLLED   . DYSLIPIDEMIA   . Graves disease   . HYPERTENSION   . HYPERTHYROIDISM   . Obesity   . Osteoarthritis    esp L hip, low back  . Palpitations   . Premature ventricular contraction     Patient Active Problem List   Diagnosis Date Noted  . Hyperglycemia 11/16/2018  . Skin cancer of nose 11/03/2016  . PVC's (premature ventricular contractions) 09/12/2015  . CKD (chronic kidney disease) stage 3, GFR 30-59 ml/min ([facility]) 09/08/2014  . Routine general medical examination at a health care facility 09/08/2014  . Generalized pain 02/04/2014  . Lumbar  radiculopathy 12/24/2013  . Osteoarthritis of right knee 12/17/2012  . Arthritis of left knee 11/20/2012  . Insomnia   . Thyroid nodule 02/20/2012  . Morbid obesity ([place]) 05/07/2011  . Hyperlipidemia associated with type 2 diabetes mellitus ([place]) 06/04/2009  . Thyrotoxicosis 05/17/2009  . Essential hypertension 05/17/2009  . CARDIOMYOPATHY 05/17/2009  . Congestive heart failure ([place]) 05/17/2009    Past Surgical History:  Procedure Laterality Date  . CARDIAC CATHETERIZATION  04/19/09  . CARDIAC CATHETERIZATION N/A 09/30/2015   Procedure: Left Heart Cath and Coronary Angiography;  Surgeon: Larey Dresser, MD;  Location: [facility];  Service: Cardiovascular;  Laterality: N/A;  . CHOLECYSTECTOMY    . remote hernia repair       OB History   No obstetric history on file.     Family History  Problem Relation Age of Onset  . Diabetes Mother   . Breast cancer Mother   . Cervical cancer Mother   . Kidney cancer Father        Kidney Cancer  . Stroke Father        CVA  . Supraventricular tachycardia Sister   . Asthma Other        family history    Social History   Tobacco Use  . Smoking status: Never Smoker  . Smokeless tobacco: Never Used  Vaping Use  . Vaping Use: Never used  Substance Use Topics  . Alcohol use: No  . Drug use:  No    Home Medications Prior to Admission medications   Medication Sig Start Date End Date Taking? Authorizing Provider  acetaminophen (TYLENOL) 500 MG tablet Take 500 mg by mouth every 6 (six) hours as needed for moderate pain.    [provider]  aspirin 81 MG tablet Take 81 mg by mouth daily.    [provider]  blood glucose meter kit and supplies Dispense based on patient and insurance preference. Use up to four times daily as directed. (FOR ICD-10 E10.9, E11.9). 07/14/19   Hoyt Koch, MD  carvedilol (COREG) 25 MG tablet Take 1 tablet (25 mg total) by mouth 2 (two) times daily. 07/24/19   Baldwin Jamaica,  PA-C  CHLORPHENIRAMINE MALEATE PO Take by mouth.    [provider]  colchicine 0.6 MG tablet Take 0.6 mg by mouth daily as needed. As directed    [provider]  enalapril (VASOTEC) 10 MG tablet Take 1 tablet (10 mg total) by mouth every evening. 01/05/20   Allred, Jeneen Rinks, MD  furosemide (LASIX) 20 MG tablet Take 1 tablet (20 mg total) by mouth 3 (three) times a week. 07/26/18   Allred, Jeneen Rinks, MD  glucose blood (FREESTYLE TEST STRIPS) test strip Use to check blood sugars twice a day 08/14/17   Hoyt Koch, MD  lovastatin (MEVACOR) 10 MG tablet Take 1 tablet (10 mg total) by mouth at bedtime. 05/21/19   Allred, Jeneen Rinks, MD  meloxicam (MOBIC) 15 MG tablet Take 1 tablet (15 mg total) by mouth daily. 12/24/19   Hoyt Koch, MD  Multiple Vitamin (MULTIVITAMIN) tablet Take 1 tablet by mouth daily.    [provider]  traMADol (ULTRAM) 50 MG tablet Take 1-2 tablets (50-100 mg total) by mouth daily as needed (pain). 01/07/20   Hoyt Koch, MD  cyclobenzaprine (FLEXERIL) 5 MG tablet Take 1 tablet (5 mg total) by mouth 3 (three) times daily as needed for muscle spasms. 06/30/19 03/17/20  Hoyt Koch, MD  methimazole (TAPAZOLE) 5 MG tablet Take 1 tablet (5 mg total) by mouth 4 (four) times a week. 01/20/20 03/17/20  Renato Shin, MD    Allergies    Latex, Wool alcohol [lanolin], and Amitriptyline  Review of Systems   Review of Systems  Constitutional: Positive for fatigue. Negative for chills, diaphoresis and fever.  HENT: Positive for congestion.   Eyes: Negative for visual disturbance.  Respiratory: Positive for cough, chest tightness and shortness of breath. Negative for wheezing and stridor.   Cardiovascular: Negative for chest pain, palpitations and leg swelling.  Gastrointestinal: Negative for abdominal pain, constipation, diarrhea, nausea and vomiting.  Genitourinary: Negative for dysuria and flank pain.  Musculoskeletal: Negative for  back pain, neck pain and neck stiffness.  Skin: Negative for rash and wound.  Neurological: Negative for light-headedness and headaches.  Psychiatric/Behavioral: Negative for agitation and confusion.  All other systems reviewed and are negative.   Physical Exam Updated Vital Signs BP (!) 162/77 (BP Location: Right Wrist)   Pulse 78   Temp 98.6 F (37 C)   Resp 20   SpO2 94%   Physical Exam Vitals and nursing note reviewed.  Constitutional:      General: She is not in acute distress.    Appearance: She is well-developed and well-nourished. She is not ill-appearing, toxic-appearing or diaphoretic.  HENT:     Head: Normocephalic and atraumatic.  Eyes:     Conjunctiva/sclera: Conjunctivae normal.     Pupils: Pupils are equal, round,  and reactive to light.  Cardiovascular:     Rate and Rhythm: Normal rate and regular rhythm.     Heart sounds: No murmur heard.   Pulmonary:     Effort: Pulmonary effort is normal. No tachypnea or respiratory distress.     Breath sounds: Normal breath sounds. No decreased breath sounds, wheezing, rhonchi or rales.  Chest:     Chest wall: No mass or tenderness.  Abdominal:     Palpations: Abdomen is soft.     Tenderness: There is no abdominal tenderness.  Musculoskeletal:     Cervical back: Neck supple.     Right lower leg: Edema present.     Left lower leg: Edema present.  Skin:    General: Skin is warm and dry.     Capillary Refill: Capillary refill takes less than 2 seconds.     Findings: No erythema.  Neurological:     General: No focal deficit present.     Mental Status: She is alert.  Psychiatric:        Mood and Affect: Mood and affect and mood normal.     ED Results / Procedures / Treatments   Labs (all labs ordered are listed, but only abnormal results are displayed) Labs Reviewed  RESP PANEL BY RT-PCR (FLU A&B, COVID) ARPGX2 - Abnormal; Notable for the following components:      Result Value   SARS Coronavirus 2 by RT PCR  POSITIVE (*)    All other components within normal limits  COMPREHENSIVE METABOLIC PANEL - Abnormal; Notable for the following components:   Glucose, Bld 113 (*)    Creatinine, Ser 1.10 (*)    Calcium 8.4 (*)    Total Bilirubin 1.4 (*)    GFR, Estimated 54 (*)    Anion gap 16 (*)    All other components within normal limits  BRAIN NATRIURETIC PEPTIDE - Abnormal; Notable for the following components:   B Natriuretic Peptide 113.2 (*)    All other components within normal limits  CBC  TROPONIN I (HIGH SENSITIVITY)  TROPONIN I (HIGH SENSITIVITY)    EKG EKG Interpretation  Date/Time:  Wednesday March 17 2020 15:50:15 EST Ventricular Rate:  79 PR Interval:  132 QRS Duration: 80 QT Interval:  424 QTC Calculation: 486 R Axis:   25 Text Interpretation: Sinus rhythm with Fusion complexes Cannot rule out Inferior infarct , age undetermined Anterior infarct , age undetermined Abnormal ECG When compared to prior, new PVC/ No STEMI Confirmed by Antony Blackbird [phone]) on 03/17/2020 6:46:32 PM   Radiology DG Chest 2 View  Result Date: 03/17/2020 CLINICAL DATA:  Shortness of breath. EXAM: CHEST - 2 VIEW COMPARISON:  May 21, 2015. FINDINGS: The heart size and mediastinal contours are within normal limits. Similar prominence of the basilar lung markings without new focal consolidation. No visible pleural effusions or pneumothorax. No acute osseous abnormality. IMPRESSION: No significant change from the prior.  No convincing pneumonia. Electronically Signed   By: Margaretha Sheffield MD   On: 03/17/2020 13:26   CT Angio Chest PE W and/or Wo Contrast  Result Date: 03/17/2020 CLINICAL DATA:  70 year old female with concern for pulmonary embolism. EXAM: CT ANGIOGRAPHY CHEST WITH CONTRAST TECHNIQUE: Multidetector CT imaging of the chest was performed using the standard protocol during bolus administration of intravenous contrast. Multiplanar CT image reconstructions and MIPs were obtained to evaluate  the vascular anatomy. CONTRAST:  1108m OMNIPAQUE IOHEXOL 350 MG/ML SOLN COMPARISON:  Chest CT dated 04/12/2009 and radiograph dated 03/17/2020.  FINDINGS: Cardiovascular: There is no cardiomegaly or pericardial effusion. The thoracic aorta is unremarkable. Evaluation of the pulmonary arteries is limited due to respiratory motion artifact as well as suboptimal opacification and timing of the contrast. No large or central pulmonary artery embolus identified. Mediastinum/Nodes: Mildly enlarged right hilar lymph node measures 11 mm short axis. No mediastinal adenopathy. The esophagus and the thyroid gland are grossly unremarkable. No mediastinal fluid collection. Lungs/Pleura: Scattered bilateral peripheral clusters of ground-glass and nodular opacities most consistent with multilobar pneumonia, likely viral or atypical in etiology including COVID-19. Clinical correlation is recommended. No lobar consolidation, pleural effusion, pneumothorax. The central airways are patent. The lung bases are incompletely included in the images and suboptimally evaluated. Upper Abdomen: No acute abnormality. Musculoskeletal: Degenerative changes of the spine. No acute osseous pathology. Review of the MIP images confirms the above findings. IMPRESSION: 1. No CT evidence of central pulmonary artery embolus. 2. Scattered bilateral peripheral clusters of ground-glass and nodular opacities most consistent with multilobar pneumonia, likely viral or atypical in etiology including COVID-19. Clinical correlation is recommended. 3. Mildly enlarged right hilar lymph node, likely reactive. Electronically Signed   By: Anner Crete M.D.   On: 03/17/2020 22:32    Procedures Procedures   Medications Ordered in ED Medications  iohexol (OMNIPAQUE) 350 MG/ML injection 100 mL (100 mLs Intravenous Contrast Given 03/17/20 2122)    ED Course  I have reviewed the triage vital signs and the nursing notes.  Pertinent labs & imaging results that  were available during my care of the patient were reviewed by me and considered in my medical decision making (see chart for details).    MDM Rules/Calculators/A&P                          DEAVION STRIDER is a 70 y.o. female with a past medical history significant for thyroid disease, hypertension, hyperlipidemia, congestive heart failure, CKD, and obesity who presents from urgent care for further evaluation of shortness of breath.  Patient reports has had URI symptoms with cough and congestion for the last week.  She reports that she had worsened shortness of breath with exertion over the last few days and went to urgent care today.  She reports that they did a chest x-ray showing no pneumonia and a D-dimer which was elevated.  They sent her here to rule out blood clot or further problem such as heart failure.  Patient reports that she does have shortness of breath but is not any chest discomfort.  She denies palpitations.  She denies nausea, vomiting, constipation, diarrhea, or urinary symptoms.  She thinks she is not fluid overloaded and she denies any leg pain or leg swelling.  She denies history of DVT or PE.  She is not on blood thinners.  Initially when I evaluate the patient, she was very frustrated for the weight she has had today of approximately 4 hours.  She reports no other complaints at this time.  She is mentating oxygen saturations on room air during my initial evaluation.  On exam, lungs are clear and chest is nontender.  Abdomen is nontender.  Legs have mild edema which she reports is at his baseline.  She was not tachycardic or tachypneic.  Blood pressure was elevated.  EKG shows no STEMI.  As patient has a positive D-dimer from several hours ago, we will order a PE study.  We will get screening labs to make sure there are no other  concerning findings and get a BNP to look for heart failure exacerbation.  Clinically I suspect this is more URI causing worsened breathing.  We will get a  Covid test and she is amenable to this.  Anticipate reassessment after work-up was completed to determine suspicion but given her well appearance anticipate discharge home today.  11:16 PM Work-up returned showing she is Covid positive. CT PE study did not show evidence of pulmonary embolism. Other labs similar to prior. Patient maintained her oxygen saturations on room air and do not feel she needs admission. As her symptoms have been going for over 1 week, she was instructed to follow-up as an outpatient with PCP. She agreed with plan of care and had otherwise no concerns. Patient discharged in good condition for shortness of breath without hypoxia in the setting of Covid.   Final Clinical Impression(s) / ED Diagnoses Final diagnoses:  SOB (shortness of breath)  COVID-19    Rx / DC Orders ED Discharge Orders    None      Clinical Impression: 1. SOB (shortness of breath)   2. COVID-19     Disposition: Discharge  Condition: Good  I have discussed the results, Dx and Tx plan with the pt(& family if present). He/she/they expressed understanding and agree(s) with the plan. Discharge instructions discussed at great length. Strict return precautions discussed and pt &/or family have verbalized understanding of the instructions. No further questions at time of discharge.    New Prescriptions   No medications on file    Follow Up: Hoyt Koch, MD [place] [address] [phone]     [facility] EMERGENCY DEPARTMENT [address] [phone] mc [place]       Lynx Goodrich, Gwenyth Allegra, MD 03/17/20 [phone]

## 2020-03-17 NOTE — ED Triage Notes (Signed)
Pt presents with headache, abdominal pain, chills shortness of breath and cough x 1 week. "I feel like I have gas". States chest feel tight when coughing. Chlorpheniramine maleate and dextromethorphan gives some relief.

## 2020-03-20 NOTE — ED Provider Notes (Signed)
[place]   [id number] 03/17/20 Arrival Time: 2542  ASSESSMENT & PLAN:  1. SOB (shortness of breath)   2. Fatigue, unspecified type   3. Feeling of chest tightness     D-dimer elevated. Given symptoms, rec ED evaluation to r/o PE. She voices understanding. No EMS. Prefers private vehicle. Stable upon discharge.  ECG: Performed today and interpreted by me: normal EKG, normal sinus rhythm. No STEMI.  I have personally viewed the imaging studies ordered this visit. No signs of PNA. No pneumothorax.  Reviewed expectations re: course of current medical issues. Questions answered. Outlined signs and symptoms indicating need for more acute intervention. Patient verbalized understanding. After Visit Summary given.   SUBJECTIVE:  History from: patient. Katie [facility] is a 70 y.o. female who presents with complaint of SOB; past few days; along "with not feeling well"; occasional "chest tightness"; no assoc n/v/diaphoresis. Feels symptoms are grad worsening. Is coughing. Afebrile. No abd pain reported. In wheelchair. No LE edema. Denies: irregular heart beat, near-syncope, palpitations and paroxysmal nocturnal dyspnea. Aggravating factors: have not been identified. Alleviating factors: have not been identified. Self/OTC treatment: none. History of similar: no. Illicit drug use: none.  Social History   Tobacco Use  Smoking Status Never Smoker  Smokeless Tobacco Never Used   Social History   Substance and Sexual Activity  Alcohol Use No     OBJECTIVE:  Vitals:   03/17/20 1244  BP: 136/77  Pulse: 75  Resp: (!) 25  Temp: 98 F (36.7 C)  TempSrc: Oral  SpO2: 96%    Recheck RR: 20  General appearance: alert, oriented, no acute distress but appears fatigued; morbidly obese Eyes: PERRLA; EOMI; conjunctivae normal HENT: normocephalic; atraumatic Neck: supple with FROM Lungs: without labored respirations; speaks full sentences without difficulty;  CTAB Heart: regular rate and rhythm Chest Wall: without tenderness to palpation Abdomen: soft, non-tender; no guarding or rebound tenderness Extremities: with bilateral mild edema; without calf swelling or tenderness; symmetrical without gross deformities Skin: warm and dry; without rash or lesions Neuro: normal gait Psychological: alert and cooperative; normal mood and affect  Labs: Results for orders placed or performed during the hospital encounter of 03/17/20  D-dimer, quantitative  Result Value Ref Range   D-Dimer, Quant 1.53 (H) 0.00 - 0.50 ug/mL-FEU   Labs Reviewed  D-DIMER, QUANTITATIVE (NOT AT [facility]) - Abnormal; Notable for the following components:      Result Value   D-Dimer, Quant 1.53 (*)    All other components within normal limits    Imaging: DG Chest 2 View  Result Date: 03/17/2020 CLINICAL DATA:  Shortness of breath. EXAM: CHEST - 2 VIEW COMPARISON:  May 21, 2015. FINDINGS: The heart size and mediastinal contours are within normal limits. Similar prominence of the basilar lung markings without new focal consolidation. No visible pleural effusions or pneumothorax. No acute osseous abnormality. IMPRESSION: No significant change from the prior.  No convincing pneumonia. Electronically Signed   By: Margaretha Sheffield MD   On: 03/17/2020 13:26     Allergies  Allergen Reactions  . Latex Rash  . Wool Alcohol [Lanolin] Rash  . Amitriptyline Other (See Comments)    Leg cramps, trimmers     Past Medical History:  Diagnosis Date  . ALLERGIC RHINITIS   . CARDIOMYOPATHY    Nonischemic. Admitted in 3/11 with CHF exac, due to hyperthyroidism. LHC (03/11) showed clean coronaries with EF 40%. Myoview showed EF 38%. Echo was a technically difficult study with mild to moderately  decreased EF, mild LVH, mild MR. Repeat echo (9/11) with EF 42-39%, mild diastolic dysfunction (grade I).  . CARPAL TUNNEL SYNDROME, LEFT, MILD   . CKD (chronic kidney disease), stage III ([place])   .  Congestive heart failure, unspecified   . DIABETES MELLITUS, CONTROLLED   . DYSLIPIDEMIA   . Graves disease   . HYPERTENSION   . HYPERTHYROIDISM   . Obesity   . Osteoarthritis    esp L hip, low back  . Palpitations   . Premature ventricular contraction    Social History   Socioeconomic History  . Marital status: Widowed    Spouse name: Not on file  . Number of children: Not on file  . Years of education: Not on file  . Highest education level: Not on file  Occupational History  . Not on file  Tobacco Use  . Smoking status: Never Smoker  . Smokeless tobacco: Never Used  Vaping Use  . Vaping Use: Never used  Substance and Sexual Activity  . Alcohol use: No  . Drug use: No  . Sexual activity: Not on file  Other Topics Concern  . Not on file  Social History Narrative   Lives in [place] with her son.   Widowed since 1992.   She is very sedentary, uses a wheelchair when out of the house.   Ambulatory in home with cane.   Social Determinants of Health   Financial Resource Strain: Not on file  Food Insecurity: Not on file  Transportation Needs: Not on file  Physical Activity: Not on file  Stress: Not on file  Social Connections: Not on file  Intimate Partner Violence: Not on file   Family History  Problem Relation Age of Onset  . Diabetes Mother   . Breast cancer Mother   . Cervical cancer Mother   . Kidney cancer Father        Kidney Cancer  . Stroke Father        CVA  . Supraventricular tachycardia Sister   . Asthma Other        family history   Past Surgical History:  Procedure Laterality Date  . CARDIAC CATHETERIZATION  04/19/09  . CARDIAC CATHETERIZATION N/A 09/30/2015   Procedure: Left Heart Cath and Coronary Angiography;  Surgeon: Larey Dresser, MD;  Location: [facility] [facility];  Service: Cardiovascular;  Laterality: N/A;  . CHOLECYSTECTOMY    . remote hernia repair       Vanessa Kick, MD 03/20/20 1014

## 2020-03-25 ENCOUNTER — Encounter (HOSPITAL_COMMUNITY): Payer: Self-pay

## 2020-03-25 ENCOUNTER — Ambulatory Visit (HOSPITAL_COMMUNITY)
Admission: EM | Admit: 2020-03-25 | Discharge: 2020-03-25 | Disposition: A | Discharge status: Home / Self Care | Payer: Medicare Other | Attending: Family Medicine | Admitting: Family Medicine

## 2020-03-25 DIAGNOSIS — L02519 Cutaneous abscess of unspecified hand: Secondary | ICD-10-CM

## 2020-03-25 MED ORDER — DOXYCYCLINE HYCLATE 100 MG PO TABS: 100.0000 mg | ORAL_TABLET | Freq: Two times a day (BID) | ORAL | 0 refills | Status: DC

## 2020-03-25 MED ORDER — LIDOCAINE HCL (PF) 1 % IJ SOLN
INTRAMUSCULAR | Status: AC
  Filled 2020-03-25: qty 30

## 2020-03-25 NOTE — ED Triage Notes (Signed)
Pt presents with an abscess to the right middle finger. Pt states it has gotten worse and states she is not sure why the abscess appeared.

## 2020-03-25 NOTE — Discharge Instructions (Signed)
Follow up early next week for re-check, sooner if worsening

## 2020-03-25 NOTE — ED Provider Notes (Signed)
[place]    CSN: [id number] Arrival date & time: 03/25/20  8938      History   Chief Complaint Chief Complaint  Patient presents with  . Abscess    HPI Katie Higgins is a 70 y.o. female.   Here today with son for evaluation of 1 week of worsening pain, redness and swelling at right middle finger DIP region. She does not recall any cuts, insect bites, or injury initially but at first thought she may have hit it on something. Now concerned about an infection in this area. Denies fever, chills, sweats, body aches. Has been using ice and oTC pain relievers with minimal relief. Has never had anything like this happen before. Does have a hx of OA and gout, no other known orthopedic conditions and states this does not feel consistent with either.      Past Medical History:  Diagnosis Date  . ALLERGIC RHINITIS   . CARDIOMYOPATHY    Nonischemic. Admitted in 3/11 with CHF exac, due to hyperthyroidism. LHC (03/11) showed clean coronaries with EF 40%. Myoview showed EF 38%. Echo was a technically difficult study with mild to moderately decreased EF, mild LVH, mild MR. Repeat echo (9/11) with EF 10-17%, mild diastolic dysfunction (grade I).  . CARPAL TUNNEL SYNDROME, LEFT, MILD   . CKD (chronic kidney disease), stage III ([place])   . Congestive heart failure, unspecified   . DIABETES MELLITUS, CONTROLLED   . DYSLIPIDEMIA   . Graves disease   . HYPERTENSION   . HYPERTHYROIDISM   . Obesity   . Osteoarthritis    esp L hip, low back  . Palpitations   . Premature ventricular contraction     Patient Active Problem List   Diagnosis Date Noted  . Hyperglycemia 11/16/2018  . Skin cancer of nose 11/03/2016  . PVC's (premature ventricular contractions) 09/12/2015  . CKD (chronic kidney disease) stage 3, GFR 30-59 ml/min (HCC) 09/08/2014  . Routine general medical examination at a health care facility 09/08/2014  . Generalized pain 02/04/2014  . Lumbar radiculopathy  12/24/2013  . Osteoarthritis of right knee 12/17/2012  . Arthritis of left knee 11/20/2012  . Insomnia   . Thyroid nodule 02/20/2012  . Morbid obesity ([place]) 05/07/2011  . Hyperlipidemia associated with type 2 diabetes mellitus ([place]) 06/04/2009  . Thyrotoxicosis 05/17/2009  . Essential hypertension 05/17/2009  . CARDIOMYOPATHY 05/17/2009  . Congestive heart failure ([place]) 05/17/2009    Past Surgical History:  Procedure Laterality Date  . CARDIAC CATHETERIZATION  04/19/09  . CARDIAC CATHETERIZATION N/A 09/30/2015   Procedure: Left Heart Cath and Coronary Angiography;  Surgeon: Larey Dresser, MD;  Location: [facility];  Service: Cardiovascular;  Laterality: N/A;  . CHOLECYSTECTOMY    . remote hernia repair      OB History   No obstetric history on file.      Home Medications    Prior to Admission medications   Medication Sig Start Date End Date Taking? Authorizing Provider  doxycycline (VIBRA-TABS) 100 MG tablet Take 1 tablet (100 mg total) by mouth 2 (two) times daily. 03/25/20  Yes Volney American, PA-C  acetaminophen (TYLENOL) 500 MG tablet Take 500 mg by mouth every 6 (six) hours as needed for moderate pain.    [provider]  aspirin 81 MG tablet Take 81 mg by mouth daily.    [provider]  blood glucose meter kit and supplies Dispense based on patient and insurance preference. Use up to four  times daily as directed. (FOR ICD-10 E10.9, E11.9). 07/14/19   Hoyt Koch, MD  carvedilol (COREG) 25 MG tablet Take 1 tablet (25 mg total) by mouth 2 (two) times daily. 07/24/19   Baldwin Jamaica, PA-C  CHLORPHENIRAMINE MALEATE PO Take by mouth.    [provider]  colchicine 0.6 MG tablet Take 0.6 mg by mouth daily as needed. As directed    [provider]  enalapril (VASOTEC) 10 MG tablet Take 1 tablet (10 mg total) by mouth every evening. 01/05/20   Allred, Jeneen Rinks, MD  furosemide (LASIX) 20 MG tablet Take 1 tablet (20 mg  total) by mouth 3 (three) times a week. 07/26/18   Allred, Jeneen Rinks, MD  glucose blood (FREESTYLE TEST STRIPS) test strip Use to check blood sugars twice a day 08/14/17   Hoyt Koch, MD  lovastatin (MEVACOR) 10 MG tablet Take 1 tablet (10 mg total) by mouth at bedtime. 05/21/19   Allred, Jeneen Rinks, MD  meloxicam (MOBIC) 15 MG tablet Take 1 tablet (15 mg total) by mouth daily. 12/24/19   Hoyt Koch, MD  Multiple Vitamin (MULTIVITAMIN) tablet Take 1 tablet by mouth daily.    [provider]  traMADol (ULTRAM) 50 MG tablet Take 1-2 tablets (50-100 mg total) by mouth daily as needed (pain). 01/07/20   Hoyt Koch, MD  cyclobenzaprine (FLEXERIL) 5 MG tablet Take 1 tablet (5 mg total) by mouth 3 (three) times daily as needed for muscle spasms. 06/30/19 03/17/20  Hoyt Koch, MD  methimazole (TAPAZOLE) 5 MG tablet Take 1 tablet (5 mg total) by mouth 4 (four) times a week. 01/20/20 03/17/20  Renato Shin, MD    Family History Family History  Problem Relation Age of Onset  . Diabetes Mother   . Breast cancer Mother   . Cervical cancer Mother   . Kidney cancer Father        Kidney Cancer  . Stroke Father        CVA  . Supraventricular tachycardia Sister   . Asthma Other        family history    Social History Social History   Tobacco Use  . Smoking status: Never Smoker  . Smokeless tobacco: Never Used  Vaping Use  . Vaping Use: Never used  Substance Use Topics  . Alcohol use: No  . Drug use: No     Allergies   Latex, Wool alcohol [lanolin], and Amitriptyline   Review of Systems Review of Systems PER HPI   Physical Exam Triage Vital Signs ED Triage Vitals  Enc Vitals Group     BP 03/25/20 1046 (!) 168/81     Pulse Rate 03/25/20 1046 70     Resp 03/25/20 1046 18     Temp 03/25/20 1046 98.2 F (36.8 C)     Temp Source 03/25/20 1046 Oral     SpO2 03/25/20 1046 97 %     Weight --      Height --      Head Circumference --      Peak Flow  --      Pain Score 03/25/20 1045 8     Pain Loc --      Pain Edu? --      Excl. in [place]? --    No data found.  Updated Vital Signs BP (!) 168/81 (BP Location: Left Arm)   Pulse 70   Temp 98.2 F (36.8 C) (Oral)   Resp 18   SpO2 97%  Visual Acuity Right Eye Distance:   Left Eye Distance:   Bilateral Distance:    Right Eye Near:   Left Eye Near:    Bilateral Near:     Physical Exam Vitals and nursing note reviewed.  Constitutional:      Appearance: Normal appearance. She is not ill-appearing.  HENT:     Head: Atraumatic.  Eyes:     Extraocular Movements: Extraocular movements intact.     Conjunctiva/sclera: Conjunctivae normal.  Cardiovascular:     Rate and Rhythm: Normal rate and regular rhythm.     Pulses: Normal pulses.     Heart sounds: Normal heart sounds.     Comments: Good cap refill right hand all 5 fingers Pulmonary:     Effort: Pulmonary effort is normal.     Breath sounds: Normal breath sounds.  Musculoskeletal:     Cervical back: Normal range of motion and neck supple.     Comments: In wheelchair, baseline Minimal ROM right middle DIP due to edema and pain  Skin:    General: Skin is warm and dry.     Findings: Erythema present.     Comments: 0.5 cm fluctuant mass present near right middle DIP joint, erythematous with some erythema beginning to extent proximally up the finger  Neurological:     Mental Status: She is alert and oriented to person, place, and time.     Sensory: No sensory deficit.  Psychiatric:        Mood and Affect: Mood normal.        Thought Content: Thought content normal.        Judgment: Judgment normal.      UC Treatments / Results  Labs (all labs ordered are listed, but only abnormal results are displayed) Labs Reviewed - No data to display  EKG   Radiology No results found.  Procedures Incision and Drainage  Date/Time: 03/25/2020 1:01 PM Performed by: Volney American, PA-C Authorized by: Volney American, PA-C   Consent:    Consent obtained:  Verbal   Consent given by:  Patient   Risks, benefits, and alternatives were discussed: yes     Risks discussed:  Bleeding, incomplete drainage, pain and infection   Alternatives discussed:  Alternative treatment Universal protocol:    Procedure explained and questions answered to patient or proxy's satisfaction: yes     Relevant documents present and verified: yes     Test results available : yes     Imaging studies available: yes     Required blood products, implants, devices, and special equipment available: yes     Site/side marked: yes     Immediately prior to procedure, a time out was called: yes     Patient identity confirmed:  Verbally with patient and arm band Location:    Type:  Abscess   Location:  Upper extremity   Upper extremity location:  Finger   Finger location:  R long finger Pre-procedure details:    Skin preparation:  Chlorhexidine with alcohol Sedation:    Sedation type:  None Anesthesia:    Anesthesia method:  Local infiltration   Local anesthetic:  Lidocaine 1% w/o epi Procedure type:    Complexity:  Simple Procedure details:    Ultrasound guidance: no     Needle aspiration: no     Incision types:  Single straight   Incision depth:  Subcutaneous   Wound management:  Probed and deloculated   Drainage:  Purulent   Drainage amount:  Moderate   Wound treatment:  Wound left open   Packing materials:  None Post-procedure details:    Procedure completion:  Tolerated well, no immediate complications   (including critical care time)  Medications Ordered in UC Medications - No data to display  Initial Impression / Assessment and Plan / UC Course  I have reviewed the triage vital signs and the nursing notes.  Pertinent labs & imaging results that were available during my care of the patient were reviewed by me and considered in my medical decision making (see chart for details).     I and D performed  with no immediate complications noted, will start on doxycycline, elevate arm at rest, warm soaks, and close PCP or hand specialist f/u next week. Return sooner for worsening sxs.   Final Clinical Impressions(s) / UC Diagnoses   Final diagnoses:  Abscess of middle finger     Discharge Instructions     Follow up early next week for re-check, sooner if worsening    ED Prescriptions    Medication Sig Dispense Auth. Provider   doxycycline (VIBRA-TABS) 100 MG tablet Take 1 tablet (100 mg total) by mouth 2 (two) times daily. 20 tablet Volney American, [place]     PDMP not reviewed this encounter.   Volney American, [place] 03/25/20 1303

## 2020-03-31 ENCOUNTER — Other Ambulatory Visit: Payer: Self-pay

## 2020-03-31 ENCOUNTER — Ambulatory Visit (HOSPITAL_COMMUNITY)
Admission: EM | Admit: 2020-03-31 | Discharge: 2020-03-31 | Disposition: A | Discharge status: Home / Self Care | Payer: Medicare Other

## 2020-03-31 ENCOUNTER — Encounter (HOSPITAL_COMMUNITY): Payer: Self-pay | Admitting: Emergency Medicine

## 2020-03-31 DIAGNOSIS — L03113 Cellulitis of right upper limb: Secondary | ICD-10-CM

## 2020-03-31 NOTE — ED Provider Notes (Signed)
[place]    CSN: [id number] Arrival date & time: 03/31/20  1352      History   Chief Complaint Chief Complaint  Patient presents with  . Hand Pain    HPI Katie [facility] is a 70 y.o. female.   HPI   Skin infection: Pt was seen on 03/25/2020 for finger abscess. This area was lanced at her visit and she was started on antibiotics.  She states that when she was first seen she was not able to move the finger and that the area of redness was at least twice as large as it is today.  She feels that the area is improving slowly with time.  She is tolerating her antibiotics well.  She reports that she has about 5 more days of antibiotic.  She has had no fevers, increased redness or increased discharge from the area.  She is now able to move her fingers.  Past Medical History:  Diagnosis Date  . ALLERGIC RHINITIS   . CARDIOMYOPATHY    Nonischemic. Admitted in 3/11 with CHF exac, due to hyperthyroidism. LHC (03/11) showed clean coronaries with EF 40%. Myoview showed EF 38%. Echo was a technically difficult study with mild to moderately decreased EF, mild LVH, mild MR. Repeat echo (9/11) with EF 35-00%, mild diastolic dysfunction (grade I).  . CARPAL TUNNEL SYNDROME, LEFT, MILD   . CKD (chronic kidney disease), stage III ([place])   . Congestive heart failure, unspecified   . DIABETES MELLITUS, CONTROLLED   . DYSLIPIDEMIA   . Graves disease   . HYPERTENSION   . HYPERTHYROIDISM   . Obesity   . Osteoarthritis    esp L hip, low back  . Palpitations   . Premature ventricular contraction     Patient Active Problem List   Diagnosis Date Noted  . Hyperglycemia 11/16/2018  . Skin cancer of nose 11/03/2016  . PVC's (premature ventricular contractions) 09/12/2015  . CKD (chronic kidney disease) stage 3, GFR 30-59 ml/min ([facility]) 09/08/2014  . Routine general medical examination at a health care facility 09/08/2014  . Generalized pain 02/04/2014  . Lumbar radiculopathy 12/24/2013   . Osteoarthritis of right knee 12/17/2012  . Arthritis of left knee 11/20/2012  . Insomnia   . Thyroid nodule 02/20/2012  . Morbid obesity ([place]) 05/07/2011  . Hyperlipidemia associated with type 2 diabetes mellitus ([place]) 06/04/2009  . Thyrotoxicosis 05/17/2009  . Essential hypertension 05/17/2009  . CARDIOMYOPATHY 05/17/2009  . Congestive heart failure ([place]) 05/17/2009    Past Surgical History:  Procedure Laterality Date  . CARDIAC CATHETERIZATION  04/19/09  . CARDIAC CATHETERIZATION N/A 09/30/2015   Procedure: Left Heart Cath and Coronary Angiography;  Surgeon: Larey Dresser, MD;  Location: [facility];  Service: Cardiovascular;  Laterality: N/A;  . CHOLECYSTECTOMY    . remote hernia repair      OB History   No obstetric history on file.      Home Medications    Prior to Admission medications   Medication Sig Start Date End Date Taking? Authorizing Provider  acetaminophen (TYLENOL) 500 MG tablet Take 500 mg by mouth every 6 (six) hours as needed for moderate pain.    [provider]  aspirin 81 MG tablet Take 81 mg by mouth daily.    [provider]  blood glucose meter kit and supplies Dispense based on patient and insurance preference. Use up to four times daily as directed. (FOR ICD-10 E10.9, E11.9). 07/14/19   Hoyt Koch,  MD  carvedilol (COREG) 25 MG tablet Take 1 tablet (25 mg total) by mouth 2 (two) times daily. 07/24/19   Baldwin Jamaica, PA-C  CHLORPHENIRAMINE MALEATE PO Take by mouth.    [provider]  colchicine 0.6 MG tablet Take 0.6 mg by mouth daily as needed. As directed    [provider]  doxycycline (VIBRA-TABS) 100 MG tablet Take 1 tablet (100 mg total) by mouth 2 (two) times daily. 03/25/20   Volney American, PA-C  enalapril (VASOTEC) 10 MG tablet Take 1 tablet (10 mg total) by mouth every evening. 01/05/20   Allred, Jeneen Rinks, MD  furosemide (LASIX) 20 MG tablet Take 1 tablet (20 mg total) by mouth  3 (three) times a week. 07/26/18   Allred, Jeneen Rinks, MD  glucose blood (FREESTYLE TEST STRIPS) test strip Use to check blood sugars twice a day 08/14/17   Hoyt Koch, MD  lovastatin (MEVACOR) 10 MG tablet Take 1 tablet (10 mg total) by mouth at bedtime. 05/21/19   Allred, Jeneen Rinks, MD  meloxicam (MOBIC) 15 MG tablet Take 1 tablet (15 mg total) by mouth daily. 12/24/19   Hoyt Koch, MD  Multiple Vitamin (MULTIVITAMIN) tablet Take 1 tablet by mouth daily.    [provider]  traMADol (ULTRAM) 50 MG tablet Take 1-2 tablets (50-100 mg total) by mouth daily as needed (pain). 01/07/20   Hoyt Koch, MD  cyclobenzaprine (FLEXERIL) 5 MG tablet Take 1 tablet (5 mg total) by mouth 3 (three) times daily as needed for muscle spasms. 06/30/19 03/17/20  Hoyt Koch, MD  methimazole (TAPAZOLE) 5 MG tablet Take 1 tablet (5 mg total) by mouth 4 (four) times a week. 01/20/20 03/17/20  Renato Shin, MD    Family History Family History  Problem Relation Age of Onset  . Diabetes Mother   . Breast cancer Mother   . Cervical cancer Mother   . Kidney cancer Father        Kidney Cancer  . Stroke Father        CVA  . Supraventricular tachycardia Sister   . Asthma Other        family history    Social History Social History   Tobacco Use  . Smoking status: Never Smoker  . Smokeless tobacco: Never Used  Vaping Use  . Vaping Use: Never used  Substance Use Topics  . Alcohol use: No  . Drug use: No     Allergies   Latex, Wool alcohol [lanolin], and Amitriptyline   Review of Systems Review of Systems  As stated above in HPI Physical Exam Triage Vital Signs ED Triage Vitals  Enc Vitals Group     BP 03/31/20 1438 (!) 155/70     Pulse Rate 03/31/20 1438 78     Resp 03/31/20 1438 19     Temp 03/31/20 1438 98.3 F (36.8 C)     Temp Source 03/31/20 1438 Oral     SpO2 03/31/20 1438 96 %     Weight --      Height --      Head Circumference --      Peak Flow --       Pain Score 03/31/20 1436 2     Pain Loc --      Pain Edu? --      Excl. in [place]? --    No data found.  Updated Vital Signs BP (!) 155/70 (BP Location: Right Wrist)   Pulse 78   Temp  98.3 F (36.8 C) (Oral)   Resp 19   SpO2 96%   Physical Exam Vitals and nursing note reviewed.  Constitutional:      General: She is not in acute distress.    Appearance: Normal appearance. She is obese. She is not ill-appearing, toxic-appearing or diaphoretic.  Skin:    Comments: The area of concern of the right 3rd distal finger has resolving erythema. No ROM reductions, trailing erythema or discharge.   Neurological:     Mental Status: She is alert.      UC Treatments / Results  Labs (all labs ordered are listed, but only abnormal results are displayed) Labs Reviewed - No data to display  EKG   Radiology No results found.  Procedures Procedures (including critical care time)  Medications Ordered in UC Medications - No data to display  Initial Impression / Assessment and Plan / UC Course  I have reviewed the triage vital signs and the nursing notes.  Pertinent labs & imaging results that were available during my care of the patient were reviewed by me and considered in my medical decision making (see chart for details).     New.  This appears to be healing as expected.  I discussed with patient that I want her to continue her warm Epsom salt soaks as well as her antibiotic course.  We discussed red flag signs and symptoms that would require a follow-up visit. Final Clinical Impressions(s) / UC Diagnoses   Final diagnoses:  None   Discharge Instructions   None    ED Prescriptions    None     PDMP not reviewed this encounter.   Hughie Closs, [place] 03/31/20 1505

## 2020-03-31 NOTE — ED Triage Notes (Signed)
Pt presents for follow-up after having abscess drained on right middle finger on 03/25/20. States still taking antibiotic prescribed.

## 2020-04-19 DIAGNOSIS — M79644 Pain in right finger(s): Secondary | ICD-10-CM | POA: Diagnosis not present

## 2020-05-14 ENCOUNTER — Encounter: Payer: Self-pay | Admitting: Internal Medicine

## 2020-05-24 ENCOUNTER — Ambulatory Visit: Payer: Medicare Other | Admitting: Internal Medicine

## 2020-05-24 ENCOUNTER — Encounter: Payer: Self-pay | Admitting: Internal Medicine

## 2020-06-18 ENCOUNTER — Encounter: Payer: Self-pay | Admitting: Internal Medicine

## 2020-06-18 ENCOUNTER — Other Ambulatory Visit: Payer: Self-pay

## 2020-06-18 ENCOUNTER — Ambulatory Visit (INDEPENDENT_AMBULATORY_CARE_PROVIDER_SITE_OTHER): Payer: Medicare Other | Admitting: Internal Medicine

## 2020-06-18 VITALS — BP 132/66 | HR 83 | Temp 98.4°F | Resp 18 | Ht 67.0 in | Wt 289.0 lb

## 2020-06-18 DIAGNOSIS — N183 Chronic kidney disease, stage 3 unspecified: Secondary | ICD-10-CM

## 2020-06-18 DIAGNOSIS — M1711 Unilateral primary osteoarthritis, right knee: Secondary | ICD-10-CM

## 2020-06-18 DIAGNOSIS — E0580 Other thyrotoxicosis without thyrotoxic crisis or storm: Secondary | ICD-10-CM

## 2020-06-18 DIAGNOSIS — R739 Hyperglycemia, unspecified: Secondary | ICD-10-CM

## 2020-06-18 DIAGNOSIS — M5416 Radiculopathy, lumbar region: Secondary | ICD-10-CM | POA: Diagnosis not present

## 2020-06-18 DIAGNOSIS — E1169 Type 2 diabetes mellitus with other specified complication: Secondary | ICD-10-CM | POA: Diagnosis not present

## 2020-06-18 DIAGNOSIS — E785 Hyperlipidemia, unspecified: Secondary | ICD-10-CM

## 2020-06-18 DIAGNOSIS — M1712 Unilateral primary osteoarthritis, left knee: Secondary | ICD-10-CM | POA: Diagnosis not present

## 2020-06-18 LAB — HEMOGLOBIN A1C: Hgb A1c MFr Bld: 5.9 % (ref 4.6–6.5)

## 2020-06-18 LAB — COMPREHENSIVE METABOLIC PANEL
ALT: 9 U/L (ref 0–35)
AST: 11 U/L (ref 0–37)
Albumin: 4.1 g/dL (ref 3.5–5.2)
Alkaline Phosphatase: 59 U/L (ref 39–117)
BUN: 39 mg/dL — ABNORMAL HIGH (ref 6–23)
CO2: 26 mEq/L (ref 19–32)
Calcium: 9 mg/dL (ref 8.4–10.5)
Chloride: 106 mEq/L (ref 96–112)
Creatinine, Ser: 1.49 mg/dL — ABNORMAL HIGH (ref 0.40–1.20)
GFR: 35.32 mL/min — ABNORMAL LOW (ref >60.00)
Glucose, Bld: 142 mg/dL — ABNORMAL HIGH (ref 70–99)
Potassium: 4.5 mEq/L (ref 3.5–5.1)
Sodium: 141 mEq/L (ref 135–145)
Total Bilirubin: 0.6 mg/dL (ref 0.2–1.2)
Total Protein: 7.3 g/dL (ref 6.0–8.3)

## 2020-06-18 LAB — CBC
HCT: 37.3 % (ref 36.0–46.0)
Hemoglobin: 12.3 g/dL (ref 12.0–15.0)
MCHC: 33 g/dL (ref 30.0–36.0)
MCV: 88.2 fl (ref 78.0–100.0)
Platelets: 272 10*3/uL (ref 150.0–400.0)
RBC: 4.23 Mil/uL (ref 3.87–5.11)
RDW: 15.1 % (ref 11.5–15.5)
WBC: 7.6 10*3/uL (ref 4.0–10.5)

## 2020-06-18 LAB — MICROALBUMIN / CREATININE URINE RATIO
Creatinine,U: 156.6 mg/dL
Microalb Creat Ratio: 2.5 mg/g (ref 0.0–30.0)
Microalb, Ur: 3.9 mg/dL — ABNORMAL HIGH (ref 0.0–1.9)

## 2020-06-18 LAB — LIPID PANEL
Cholesterol: 116 mg/dL (ref 0–200)
HDL: 50.8 mg/dL (ref >39.00)
LDL Cholesterol: 44 mg/dL (ref 0–99)
NonHDL: 65.17
Total CHOL/HDL Ratio: 2
Triglycerides: 105 mg/dL (ref 0.0–149.0)
VLDL: 21 mg/dL (ref 0.0–40.0)

## 2020-06-18 LAB — TSH: TSH: 1.55 u[IU]/mL (ref 0.35–4.50)

## 2020-06-18 LAB — T4, FREE: Free T4: 0.96 ng/dL (ref 0.60–1.60)

## 2020-06-18 MED ORDER — TRAMADOL HCL 50 MG PO TABS: 50.0000 mg | ORAL_TABLET | Freq: Every day | ORAL | 5 refills | Status: DC | PRN

## 2020-06-18 MED ORDER — MELOXICAM 15 MG PO TABS: 15.0000 mg | ORAL_TABLET | Freq: Every day | ORAL | 1 refills | Status: DC

## 2020-06-18 NOTE — Assessment & Plan Note (Signed)
Checking CMP as she is taking meloxicam. If change will need to stop this. BP at goal.

## 2020-06-18 NOTE — Assessment & Plan Note (Signed)
Ordered HgA1c and microalbumin to creatinine ratio. She is diet controlled and on statin and ACE-I.

## 2020-06-18 NOTE — Patient Instructions (Addendum)
We have sent in the refills of the medicines to the [facility] and the meds by mail.   We are checking the labs today.

## 2020-06-18 NOTE — Assessment & Plan Note (Signed)
Checking lipid panel and adjust lovastatin as needed.

## 2020-06-18 NOTE — Assessment & Plan Note (Signed)
She does appear to be filling about 3-4 days early most months per  narcotic database. She should not be out until 07/04/20 and states she has only about a week left. She denies overuse or misuse. She is due for UDS today and ordered monitoring. Refill tramadol. Needs follow up in 6 months.

## 2020-06-18 NOTE — Progress Notes (Signed)
   Subjective:   Patient ID: Katie Higgins, female    DOB: 04/11/1950, 70 y.o.   MRN: [id number]  HPI The patient is a 70 YO female coming in for follow up chronic back pain (taking meloxicam and tramadol BID, gets those from meds by mail, needs refill on both today, denies overuse or misuse) and high sugars (previously diabetes, with weight loss now in pre-diabetes range, diet controlled monitors sugars as needed), and cholesterol (taking lovastatin, denies side effects, denies chest pains or stroke symptoms) and thyroid (taking methimazole, levels last checked Dec 2021).   Review of Systems  Constitutional: Negative.   HENT: Negative.   Eyes: Negative.   Respiratory: Negative for cough, chest tightness and shortness of breath.   Cardiovascular: Negative for chest pain, palpitations and leg swelling.  Gastrointestinal: Negative for abdominal distention, abdominal pain, constipation, diarrhea, nausea and vomiting.  Musculoskeletal: Positive for back pain and myalgias.  Skin: Negative.   Neurological: Negative.   Psychiatric/Behavioral: Negative.     Objective:  Physical Exam Constitutional:      Appearance: She is well-developed.  HENT:     Head: Normocephalic and atraumatic.  Cardiovascular:     Rate and Rhythm: Normal rate and regular rhythm.  Pulmonary:     Effort: Pulmonary effort is normal. No respiratory distress.     Breath sounds: Normal breath sounds. No wheezing or rales.  Abdominal:     General: Bowel sounds are normal. There is no distension.     Palpations: Abdomen is soft.     Tenderness: There is no abdominal tenderness. There is no rebound.  Musculoskeletal:        General: Tenderness present.     Cervical back: Normal range of motion.  Skin:    General: Skin is warm and dry.  Neurological:     Mental Status: She is alert and oriented to person, place, and time.     Coordination: Coordination abnormal.     Comments: Wheelchair     Vitals:   06/18/20  1342  BP: 132/66  Pulse: 83  Resp: 18  Temp: 98.4 F (36.9 C)  TempSrc: Oral  SpO2: 97%  Weight: 289 lb (131.1 kg)  Height: 5\' 7"  (1.702 m)    This visit occurred during the SARS-CoV-2 public health emergency.  Safety protocols were in place, including screening questions prior to the visit, additional usage of staff PPE, and extensive cleaning of exam room while observing appropriate contact time as indicated for disinfecting solutions.   Assessment & Plan:

## 2020-06-18 NOTE — Assessment & Plan Note (Signed)
Checking TSH and free T4.

## 2020-06-23 LAB — DRUG MONITORING, PANEL 8 WITH CONFIRMATION, URINE
6 Acetylmorphine: NEGATIVE ng/mL (ref <10)
Alcohol Metabolites: NEGATIVE ng/mL
Amphetamines: NEGATIVE ng/mL (ref <500)
Benzodiazepines: NEGATIVE ng/mL (ref <100)
Buprenorphine, Urine: NEGATIVE ng/mL (ref <5)
Cocaine Metabolite: NEGATIVE ng/mL (ref <150)
Creatinine: 104.6 mg/dL
MDMA: NEGATIVE ng/mL (ref <500)
Marijuana Metabolite: NEGATIVE ng/mL (ref <20)
Oxycodone: NEGATIVE ng/mL (ref <100)
pH: 9.1 — ABNORMAL HIGH (ref 4.5–9.0)

## 2020-06-23 LAB — DRUG MONITOR, TRAMADOL,QN, URINE

## 2020-06-23 LAB — DM TEMPLATE

## 2020-06-28 ENCOUNTER — Encounter: Payer: Self-pay | Admitting: Internal Medicine

## 2020-06-29 NOTE — Telephone Encounter (Signed)
Patient called and was wondering if traMADol (ULTRAM) 50 MG tablet could be sent to [place], [place]. She said that meds by mail has not received the prescription and she said that she is out of medication. Please advise

## 2020-06-30 ENCOUNTER — Encounter: Payer: Self-pay | Admitting: Internal Medicine

## 2020-07-28 DIAGNOSIS — R0602 Shortness of breath: Secondary | ICD-10-CM

## 2020-07-28 DIAGNOSIS — I493 Ventricular premature depolarization: Secondary | ICD-10-CM

## 2020-07-28 DIAGNOSIS — I5032 Chronic diastolic (congestive) heart failure: Secondary | ICD-10-CM

## 2020-07-28 MED ORDER — CARVEDILOL 25 MG PO TABS: 25.0000 mg | ORAL_TABLET | Freq: Two times a day (BID) | ORAL | 1 refills | Status: DC

## 2020-07-28 MED ORDER — ENALAPRIL MALEATE 10 MG PO TABS: 10.0000 mg | ORAL_TABLET | Freq: Every evening | ORAL | 1 refills | Status: DC

## 2020-07-28 MED ORDER — LOVASTATIN 10 MG PO TABS: 10.0000 mg | ORAL_TABLET | Freq: Every day | ORAL | 1 refills | Status: DC

## 2020-07-29 ENCOUNTER — Ambulatory Visit: Payer: Medicare Other | Admitting: Endocrinology

## 2020-08-03 ENCOUNTER — Ambulatory Visit: Admitting: Physician Assistant

## 2020-08-04 ENCOUNTER — Other Ambulatory Visit: Payer: Self-pay | Admitting: *Deleted

## 2020-08-04 DIAGNOSIS — I5032 Chronic diastolic (congestive) heart failure: Secondary | ICD-10-CM

## 2020-08-04 DIAGNOSIS — I493 Ventricular premature depolarization: Secondary | ICD-10-CM

## 2020-08-04 DIAGNOSIS — R0602 Shortness of breath: Secondary | ICD-10-CM

## 2020-08-04 MED ORDER — LOVASTATIN 10 MG PO TABS: 10.0000 mg | ORAL_TABLET | Freq: Every day | ORAL | 3 refills | Status: DC

## 2020-08-04 MED ORDER — ENALAPRIL MALEATE 10 MG PO TABS: 10.0000 mg | ORAL_TABLET | Freq: Every evening | ORAL | 3 refills | Status: DC

## 2020-09-07 ENCOUNTER — Ambulatory Visit: Admitting: Student

## 2020-09-10 ENCOUNTER — Ambulatory Visit: Payer: Medicare Other | Admitting: Student

## 2020-09-13 ENCOUNTER — Telehealth: Payer: Self-pay | Admitting: Internal Medicine

## 2020-09-13 NOTE — Telephone Encounter (Signed)
Patient is requesting referral to [facility] [phone]) for eye check & diabetic eye test

## 2020-09-14 NOTE — Telephone Encounter (Signed)
Should not need referral can just call herself and schedule.

## 2020-09-14 NOTE — Telephone Encounter (Signed)
See below

## 2020-09-15 NOTE — Telephone Encounter (Signed)
Spoke with the patient and she stated the reason she requested the referral because she wants to make sure medicare covers the visit. Please advise

## 2020-09-16 NOTE — Telephone Encounter (Signed)
She does not need referral please have her call and schedule this.

## 2020-09-16 NOTE — Telephone Encounter (Signed)
Patient is aware that referral is not needed.

## 2020-10-12 ENCOUNTER — Encounter: Payer: Self-pay | Admitting: Endocrinology

## 2020-10-13 ENCOUNTER — Other Ambulatory Visit: Payer: Self-pay

## 2020-10-13 DIAGNOSIS — E041 Nontoxic single thyroid nodule: Secondary | ICD-10-CM

## 2020-10-13 MED ORDER — METHIMAZOLE 5 MG PO TABS: 5.0000 mg | ORAL_TABLET | ORAL | 1 refills | Status: DC

## 2020-10-15 DIAGNOSIS — E119 Type 2 diabetes mellitus without complications: Secondary | ICD-10-CM | POA: Diagnosis not present

## 2020-10-20 ENCOUNTER — Telehealth: Payer: Self-pay | Admitting: Internal Medicine

## 2020-10-20 NOTE — Telephone Encounter (Signed)
Left message for patient to call me back at [phone] to schedule Medicare Annual Wellness Visit   Last AWV  07/14/19  Please schedule at anytime with [facility] if patient calls the office back.    40 Minutes appointment   Any questions, please call me at [phone]

## 2020-10-29 ENCOUNTER — Encounter: Payer: Self-pay | Admitting: Internal Medicine

## 2020-11-01 ENCOUNTER — Other Ambulatory Visit: Payer: Self-pay

## 2020-11-01 MED ORDER — MELOXICAM 15 MG PO TABS: 15.0000 mg | ORAL_TABLET | Freq: Every day | ORAL | 1 refills | Status: DC

## 2020-11-07 NOTE — Progress Notes (Deleted)
Cardiology Office Note Date:  11/07/2020  Patient ID:  Katie Higgins, Katie Higgins 1950/05/05, MRN [id number] PCP:  Hoyt Koch, MD  Cardiologist:  Dr. [name] (2017) EP: Dr. Rayann Higgins Endo: Dr. [name]    Chief Complaint:   *** 4movisit  History of Present Illness: Katie BADLEYis a 70y.o. female with history of hyperthyroidism, PVCs, DM, HLD, HTN, CKD (III), morbid obesity. 2011 w/u noted CM > LHC w/normal coronaries.   CM suspect 2/2 hyperthyroidism, and did improve with therapy.  She was referred to Dr. ARayann Hemanwho felt with minimial symptoms from PVCs and imporvement of her EF, pt preference to avoid invasive procedure, no plans for EP/ablation  She saw Katie Higgins 2018, described as essentially wheelchair bound, no symptoms of PVCs, planned to update her echo, if remained preserved LVEF to continue medical therapy. LVEF was 55-60% She has seen Dr. ARayann Hemanannually, most recently via tele health June 2020, doing well, no changes were made, planned for annual APP visit.  I saw her 07/14/2019 She is accompanied by her son today.  She is in a wheelchair, but reports that she is able to ambulates some.  She has bad OA of her hips and knees that is why she uses a wheelchair.  She says she can stand long enough to cook, wash up, able to do her ADLs independently.  She denies any palpitations or awareness of any PVCs if she is having any.  She will rarely get a fleeting/momentary "pang" in the center of her chest.  maybe the last was 6 mo or so ago.  No rest SOB, no DOE at her level of exertinal.  She denies symptoms of orthopnea or PND.   No dizzy spells, near syncope or syncope.  She has noticed swelling in her feet for the last 3 weeks, it is nearly if not completely resolved in the AM, but then some days uncomfortably swollen.  She notes this is associated with an aching discomfort to the very lateral edges of her feet, like she has been walking on the edge of her feet, or has a "rock  bruise"  Edema sounded more of a dependent edema, Recommend she take her lasix QD for 3 days then resume 3d/week, discussed minimizing sodium and elevating feet when seated.  If her lasix requirements remain increased, will update her echo, she is alsked to let uKoreaknow. No PVCs on her EKG.  01/12/20 with me She is accompanied by her son again today She lives in her own home, essentially wheelchair bound for ambulating 2/2 her OA and terrible knee pain, but once in the kitchen stands to cook, do dishes, etc  Able to care for herself. She denies any CP, palpitations or SOB, no cardiac awareness. She denies any symptoms of PND or orthopnea. Tolerating her medicines well No recurrent edema that she noticed  Exam was without extrasystoles, no symptoms or exam findings to suggest volume OL or cardiac changes.  Planned for 678moisit > 1 year if she remained clinically stable cardiac-wise.  *** symptoms, PVCs, volume *** labs, lipids, thyroid  *** meds, CM  Past Medical History:  Diagnosis Date   ALLERGIC RHINITIS    CARDIOMYOPATHY    Nonischemic. Admitted in 3/11 with CHF exac, due to hyperthyroidism. LHC (03/11) showed clean coronaries with EF 40%. Myoview showed EF 38%. Echo was a technically difficult study with mild to moderately decreased EF, mild LVH, mild MR. Repeat echo (9/11) with EF 55-60%,  mild diastolic dysfunction (grade I).   CARPAL TUNNEL SYNDROME, LEFT, MILD    CKD (chronic kidney disease), stage III (HCC)    Congestive heart failure, unspecified    DIABETES MELLITUS, CONTROLLED    DYSLIPIDEMIA    Graves disease    HYPERTENSION    HYPERTHYROIDISM    Obesity    Osteoarthritis    esp L hip, low back   Palpitations    Premature ventricular contraction     Past Surgical History:  Procedure Laterality Date   CARDIAC CATHETERIZATION  04/19/09   CARDIAC CATHETERIZATION N/A 09/30/2015   Procedure: Left Heart Cath and Coronary Angiography;  Surgeon: Larey Dresser, MD;   Location: [facility];  Service: Cardiovascular;  Laterality: N/A;   CHOLECYSTECTOMY     remote hernia repair      Current Outpatient Medications  Medication Sig Dispense Refill   acetaminophen (TYLENOL) 500 MG tablet Take 500 mg by mouth every 6 (six) hours as needed for moderate pain.     aspirin 81 MG tablet Take 81 mg by mouth daily.     blood glucose meter kit and supplies Dispense based on patient and insurance preference. Use up to four times daily as directed. (FOR ICD-10 E10.9, E11.9). 1 each 0   carvedilol (COREG) 25 MG tablet Take 1 tablet (25 mg total) by mouth 2 (two) times daily. 180 tablet 1   CHLORPHENIRAMINE MALEATE PO Take by mouth.     colchicine 0.6 MG tablet Take 0.6 mg by mouth daily as needed. As directed     doxycycline (VIBRA-TABS) 100 MG tablet Take 1 tablet (100 mg total) by mouth 2 (two) times daily. 20 tablet 0   enalapril (VASOTEC) 10 MG tablet Take 1 tablet (10 mg total) by mouth every evening. 90 tablet 3   furosemide (LASIX) 20 MG tablet Take 1 tablet (20 mg total) by mouth 3 (three) times a week. 45 tablet 3   glucose blood (FREESTYLE TEST STRIPS) test strip Use to check blood sugars twice a day 300 each 1   lovastatin (MEVACOR) 10 MG tablet Take 1 tablet (10 mg total) by mouth at bedtime. 90 tablet 3   meloxicam (MOBIC) 15 MG tablet Take 1 tablet (15 mg total) by mouth daily. 90 tablet 1   methimazole (TAPAZOLE) 5 MG tablet Take 1 tablet (5 mg total) by mouth 4 (four) times a week. 48 tablet 1   Multiple Vitamin (MULTIVITAMIN) tablet Take 1 tablet by mouth daily.     traMADol (ULTRAM) 50 MG tablet Take 1-2 tablets (50-100 mg total) by mouth daily as needed (pain). 60 tablet 5   No current facility-administered medications for this visit.    Allergies:   Latex, Wool alcohol [lanolin], and Amitriptyline   Social History:  The patient  reports that she has never smoked. She has never used smokeless tobacco. She reports that she does not drink alcohol  and does not use drugs.   Family History:  The patient's family history includes Asthma in an other family member; Breast cancer in her mother; Cervical cancer in her mother; Diabetes in her mother; Kidney cancer in her father; Stroke in her father; Supraventricular tachycardia in her sister.  ROS:  Please see the history of present illness.  All other systems are reviewed and otherwise negative.   PHYSICAL EXAM:  VS:  There were no vitals taken for this visit. BMI: There is no height or weight on file to calculate BMI. Well nourished, well developed,  in no acute distress  HEENT: normocephalic, atraumatic  Neck: no JVD, carotid bruits or masses Cardiac:  *** RRR; *** no significant murmurs, no rubs, or gallops, no extrasystoles noted with prolonged auscultation Lungs:  *** CTA b/l, no wheezing, rhonchi or rales  Abd: soft, nontender,  she has a very large pannus MS: no deformity or atrophy Ext: *** ne edema b/l Skin: warm and dry, no rash Neuro:  No gross deficits appreciated Psych: euthymic mood, full affect   EKG: not done today 03/17/20 EKGs is reviewed:  SR 68bpm, no PVCs SR 79bpm, 2 PVCs  05/11/2016: TTE Study Conclusions  - Left ventricle: The cavity size was normal. Systolic function was    normal. The estimated ejection fraction was in the range of 55%    to 60%. Wall motion was normal; there were no regional wall    motion abnormalities. Doppler parameters are consistent with    abnormal left ventricular relaxation (grade 1 diastolic    dysfunction). Doppler parameters are consistent with elevated    ventricular end-diastolic filling pressure. Mean left atrial    pressure appears to be normal at rest.    09/30/2015: LHC No obstructive coronary disease.  Small caliber distal LAD but similar to prior study.    Recent Labs: 03/17/2020: B Natriuretic Peptide 113.2 06/18/2020: ALT 9; BUN 39; Creatinine, Ser 1.49; Hemoglobin 12.3; Platelets 272.0; Potassium 4.5; Sodium 141;  TSH 1.55  06/18/2020: Cholesterol 116; HDL 50.80; LDL Cholesterol 44; Total CHOL/HDL Ratio 2; Triglycerides 105.0; VLDL 21.0   CrCl cannot be calculated (Patient's most recent lab result is older than the maximum 21 days allowed.).   Wt Readings from Last 3 Encounters:  06/18/20 289 lb (131.1 kg)  01/12/20 283 lb (128.4 kg)  07/14/19 286 lb (129.7 kg)     Other studies reviewed: Additional studies/records reviewed today include: summarized above  ASSESSMENT AND PLAN:  1. PVCs     *** No symptoms to suggest any significant burden.     *** None noted today with prolonged auscultation  2. NICM     Recovered LVEF, suspect 2/2 hyperthyriodism     *** No exam findings or symptoms to suggest change  3. HTN     *** Looks good, no changes   Disposition: ***.  Current medicines are reviewed at length with the patient today.  The patient did not have any concerns regarding medicines  Signed, Tommye Standard, PA-C 11/07/2020 11:18 AM     [facility] [address] [place] [address] [phone] (office)  [phone] (fax)

## 2020-11-09 ENCOUNTER — Ambulatory Visit: Payer: Medicare Other | Admitting: Physician Assistant

## 2020-11-10 ENCOUNTER — Ambulatory Visit (INDEPENDENT_AMBULATORY_CARE_PROVIDER_SITE_OTHER): Payer: Medicare Other

## 2020-11-10 DIAGNOSIS — H9193 Unspecified hearing loss, bilateral: Secondary | ICD-10-CM | POA: Diagnosis not present

## 2020-11-10 DIAGNOSIS — Z0000 Encounter for general adult medical examination without abnormal findings: Secondary | ICD-10-CM | POA: Diagnosis not present

## 2020-11-10 NOTE — Progress Notes (Addendum)
I connected with Katie Higgins today by telephone and verified that I am speaking with the correct person using two identifiers. Location patient: home Location provider: work Persons participating in the virtual visit: patient, provider.   I discussed the limitations, risks, security and privacy concerns of performing an evaluation and management service by telephone and the availability of in person appointments. I also discussed with the patient that there may be a patient responsible charge related to this service. The patient expressed understanding and verbally consented to this telephonic visit.    Interactive audio and video telecommunications were attempted between this provider and patient, however failed, due to patient having technical difficulties OR patient did not have access to video capability.  We continued and completed visit with audio only.  Some vital signs may be absent or patient reported.   Time Spent with patient on telephone encounter: 40 minutes  Subjective:   Katie Higgins is a 70 y.o. female who presents for Medicare Annual (Subsequent) preventive examination.  Review of Systems     Cardiac Risk Factors include: advanced age (>8mn, >>25women);dyslipidemia;hypertension;family history of premature cardiovascular disease     Objective:    Today's Vitals   11/10/20 1556  PainSc: 7    There is no height or weight on file to calculate BMI.  Advanced Directives 11/10/2020 09/30/2015 05/20/2015  Does Patient Have a Medical Advance Directive? No No No  Would patient like information on creating a medical advance directive? No - Patient declined No - patient declined information No - patient declined information    Current Medications (verified) Outpatient Encounter Medications as of 11/10/2020  Medication Sig   acetaminophen (TYLENOL) 500 MG tablet Take 500 mg by mouth every 6 (six) hours as needed for moderate pain.   aspirin 81 MG tablet Take 81 mg by  mouth daily.   blood glucose meter kit and supplies Dispense based on patient and insurance preference. Use up to four times daily as directed. (FOR ICD-10 E10.9, E11.9).   carvedilol (COREG) 25 MG tablet Take 1 tablet (25 mg total) by mouth 2 (two) times daily.   CHLORPHENIRAMINE MALEATE PO Take by mouth.   colchicine 0.6 MG tablet Take 0.6 mg by mouth daily as needed. As directed   doxycycline (VIBRA-TABS) 100 MG tablet Take 1 tablet (100 mg total) by mouth 2 (two) times daily.   enalapril (VASOTEC) 10 MG tablet Take 1 tablet (10 mg total) by mouth every evening.   furosemide (LASIX) 20 MG tablet Take 1 tablet (20 mg total) by mouth 3 (three) times a week.   glucose blood (FREESTYLE TEST STRIPS) test strip Use to check blood sugars twice a day   lovastatin (MEVACOR) 10 MG tablet Take 1 tablet (10 mg total) by mouth at bedtime.   meloxicam (MOBIC) 15 MG tablet Take 1 tablet (15 mg total) by mouth daily.   methimazole (TAPAZOLE) 5 MG tablet Take 1 tablet (5 mg total) by mouth 4 (four) times a week.   Multiple Vitamin (MULTIVITAMIN) tablet Take 1 tablet by mouth daily.   traMADol (ULTRAM) 50 MG tablet Take 1-2 tablets (50-100 mg total) by mouth daily as needed (pain).   [DISCONTINUED] cyclobenzaprine (FLEXERIL) 5 MG tablet Take 1 tablet (5 mg total) by mouth 3 (three) times daily as needed for muscle spasms.   No facility-administered encounter medications on file as of 11/10/2020.    Allergies (verified) Latex, Wool alcohol [lanolin], and Amitriptyline   History: Past Medical History:  Diagnosis Date  ALLERGIC RHINITIS    CARDIOMYOPATHY    Nonischemic. Admitted in 3/11 with CHF exac, due to hyperthyroidism. LHC (03/11) showed clean coronaries with EF 40%. Myoview showed EF 38%. Echo was a technically difficult study with mild to moderately decreased EF, mild LVH, mild MR. Repeat echo (9/11) with EF 65-03%, mild diastolic dysfunction (grade I).   CARPAL TUNNEL SYNDROME, LEFT, MILD    CKD  (chronic kidney disease), stage III (HCC)    Congestive heart failure, unspecified    DIABETES MELLITUS, CONTROLLED    DYSLIPIDEMIA    Graves disease    HYPERTENSION    HYPERTHYROIDISM    Obesity    Osteoarthritis    esp L hip, low back   Palpitations    Premature ventricular contraction    Past Surgical History:  Procedure Laterality Date   CARDIAC CATHETERIZATION  04/19/09   CARDIAC CATHETERIZATION N/A 09/30/2015   Procedure: Left Heart Cath and Coronary Angiography;  Surgeon: Larey Dresser, MD;  Location: [facility];  Service: Cardiovascular;  Laterality: N/A;   CHOLECYSTECTOMY     remote hernia repair     Family History  Problem Relation Age of Onset   Diabetes Mother    Breast cancer Mother    Cervical cancer Mother    Kidney cancer Father        Kidney Cancer   Stroke Father        CVA   Supraventricular tachycardia Sister    Asthma Other        family history   Social History   Socioeconomic History   Marital status: Widowed    Spouse name: Not on file   Number of children: Not on file   Years of education: Not on file   Highest education level: Not on file  Occupational History   Not on file  Tobacco Use   Smoking status: Never   Smokeless tobacco: Never  Vaping Use   Vaping Use: Never used  Substance and Sexual Activity   Alcohol use: No   Drug use: No   Sexual activity: Not on file  Other Topics Concern   Not on file  Social History Narrative   Lives in [place] with her son.   Widowed since 1992.   She is very sedentary, uses a wheelchair when out of the house.   Ambulatory in home with cane.   Social Determinants of Health   Financial Resource Strain: Low Risk    Difficulty of Paying Living Expenses: Not hard at all  Food Insecurity: No Food Insecurity   Worried About Charity fundraiser in the Last Year: Never true   [place] in the Last Year: Never true  Transportation Needs: No Transportation Needs   Lack of  Transportation (Medical): No   Lack of Transportation (Non-Medical): No  Physical Activity: Inactive   Days of Exercise per Week: 0 days   Minutes of Exercise per Session: 0 min  Stress: No Stress Concern Present   Feeling of Stress : Not at all  Social Connections: Socially Isolated   Frequency of Communication with Friends and Family: More than three times a week   Frequency of Social Gatherings with Friends and Family: More than three times a week   Attends Religious Services: Never   Marine scientist or Organizations: No   Attends Archivist Meetings: Never   Marital Status: Widowed    Tobacco Counseling Counseling given: Not Answered   Clinical Intake:  Pre-visit preparation completed: Yes  Pain : 0-10 Pain Score: 7  Pain Type: Chronic pain Pain Location: Back Pain Orientation: Lower Pain Radiating Towards: bilateral lower hips and knees Pain Descriptors / Indicators: Dull, Discomfort Pain Onset: More than a month ago Pain Frequency: Intermittent Pain Relieving Factors: Tramadol Effect of Pain on Daily Activities: Pain can diminish job performance, lower motivation to exercise, and prevent you from completing daily tasks. Pain produces disability and affects the quality of life.  Pain Relieving Factors: Tramadol  Nutritional Risks: None Diabetes: No  How often do you need to have someone help you when you read instructions, pamphlets, or other written materials from your doctor or pharmacy?: 1 - Never What is the last grade level you completed in school?: [place]; some college courses  Diabetic? no  Interpreter Needed?: No  Information entered by :: Lisette Abu, LPN   Activities of Daily Living In your present state of health, do you have any difficulty performing the following activities: 11/10/2020 06/18/2020  Hearing? Y N  Vision? N N  Difficulty concentrating or making decisions? N N  Walking or climbing stairs? Y N   Dressing or bathing? N N  Doing errands, shopping? Y N  Preparing Food and eating ? N -  Using the Toilet? N -  In the past six months, have you accidently leaked urine? N -  Do you have problems with loss of bowel control? N -  Managing your Medications? N -  Managing your Finances? N -  Housekeeping or managing your Housekeeping? N -  Some recent data might be hidden    Patient Care Team: Hoyt Koch, MD as PCP - General (Internal Medicine) Renato Shin, MD as Consulting Physician (Endocrinology) Larey Dresser, MD as Consulting Physician (Cardiology) Lyndal Pulley, DO (Sports Medicine)  Indicate any recent Medical Services you may have received from other than [facility] providers in the past year (date may be approximate).     Assessment:   This is a routine wellness examination for Kamille.  Hearing/Vision screen Hearing Screening - Comments:: Patient stated that she has difficulty hearing. Patient has hearing aids.   Dietary issues and exercise activities discussed: Current Exercise Habits: The patient does not participate in regular exercise at present, Exercise limited by: cardiac condition(s);orthopedic condition(s)   Goals Addressed             This Visit's Progress    Client understands the importance of follow-up with providers by attending scheduled visits       Continue to be independent, active and doing my brain exercises everyday.      Depression Screen PHQ 2/9 Scores 11/10/2020 07/14/2019 10/31/2016 10/08/2015  PHQ - 2 Score 0 0 1 0    Fall Risk Fall Risk  11/10/2020 07/14/2019 10/31/2016 10/08/2015  Falls in the past year? 0 0 No No  Number falls in past yr: 0 - - -  Injury with Fall? 0 - - -  Risk for fall due to : No Fall Risks - - -  Follow up Falls evaluation completed - - -    FALL RISK PREVENTION PERTAINING TO THE HOME:  Any stairs in or around the home? Yes  If so, are there any without handrails? No  Home free of loose throw rugs  in walkways, pet beds, electrical cords, etc? Yes  Adequate lighting in your home to reduce risk of falls? Yes   ASSISTIVE DEVICES UTILIZED TO PREVENT FALLS:  Life alert? No  Use of a cane, walker or w/c? Yes  Grab bars in the bathroom? No  Shower chair or bench in shower? No  Elevated toilet seat or a handicapped toilet? Yes   TIMED UP AND GO:  Was the test performed? No .  Length of time to ambulate 10 feet: n/a sec.   Gait slow and steady with assistive device  Cognitive Function: Normal cognitive status assessed by direct observation by this Nurse Health Advisor. No abnormalities found.          Immunizations Immunization History  Administered Date(s) Administered   Fluad Quad(high Dose 65+) 11/14/2018, 01/22/2020   Influenza Split 01/09/2011, 11/15/2011   Influenza Whole 09/22/2009, 07/07/2010   Influenza, High Dose Seasonal PF 10/08/2015, 11/15/2017   Influenza,inj,Quad PF,6+ Mos 11/20/2012, 12/24/2013   Influenza-Unspecified 11/07/2014   Pneumococcal Conjugate-13 10/31/2016   Pneumococcal Polysaccharide-23 02/04/2014, 05/21/2015   Tdap 09/21/2015   Zoster, Live 09/08/2014    TDAP status: Up to date  Flu Vaccine status: Due, Education has been provided regarding the importance of this vaccine. Advised may receive this vaccine at local pharmacy or Health Dept. Aware to provide a copy of the vaccination record if obtained from local pharmacy or Health Dept. Verbalized acceptance and understanding.  Pneumococcal vaccine status: Up to date  Covid-19 vaccine status: Declined, Education has been provided regarding the importance of this vaccine but patient still declined. Advised may receive this vaccine at local pharmacy or Health Dept.or vaccine clinic. Aware to provide a copy of the vaccination record if obtained from local pharmacy or Health Dept. Verbalized acceptance and understanding.  Qualifies for Shingles Vaccine? Yes   Zostavax completed Yes   Shingrix  Completed?: No.    Education has been provided regarding the importance of this vaccine. Patient has been advised to call insurance company to determine out of pocket expense if they have not yet received this vaccine. Advised may also receive vaccine at local pharmacy or Health Dept. Verbalized acceptance and understanding.  Screening Tests Health Maintenance  Topic Date Due   COVID-19 Vaccine (1) Never done   OPHTHALMOLOGY EXAM  Never done   Zoster Vaccines- Shingrix (1 of 2) Never done   COLONOSCOPY (Pts 45-74yr Insurance coverage will need to be confirmed)  Never done   MAMMOGRAM  Never done   DEXA SCAN  Never done   INFLUENZA VACCINE  09/06/2020   HEMOGLOBIN A1C  12/19/2020   FOOT EXAM  01/21/2021   TETANUS/TDAP  09/20/2025   Hepatitis C Screening  Completed   HPV VACCINES  Aged Out    Health Maintenance  Health Maintenance Due  Topic Date Due   COVID-19 Vaccine (1) Never done   OPHTHALMOLOGY EXAM  Never done   Zoster Vaccines- Shingrix (1 of 2) Never done   COLONOSCOPY (Pts 45-491yrInsurance coverage will need to be confirmed)  Never done   MAMMOGRAM  Never done   DEXA SCAN  Never done   INFLUENZA VACCINE  09/06/2020    Colorectal cancer screening: No longer required.   Mammogram status: No longer required due to patient declined.  Bone density status: patient declined  Lung Cancer Screening: (Low Dose CT Chest recommended if Age 71-80ears, 30 pack-year currently smoking OR have quit w/in 15years.) does not qualify.   Lung Cancer Screening Referral: no  Additional Screening:  Hepatitis C Screening: does qualify; Completed no  Vision Screening: Recommended annual ophthalmology exams for early detection of glaucoma and other disorders of the eye. Is the patient up  to date with their annual eye exam?  Yes  Who is the provider or what is the name of the office in which the patient attends annual eye exams? [facility] If pt is not established  with a provider, would they like to be referred to a provider to establish care? No .   Dental Screening: Recommended annual dental exams for proper oral hygiene  Community Resource Referral / Chronic Care Management: CRR required this visit?  No   CCM required this visit?  No      Plan:     I have personally reviewed and noted the following in the patient's chart:   Medical and social history Use of alcohol, tobacco or illicit drugs  Current medications and supplements including opioid prescriptions.  Functional ability and status Nutritional status Physical activity Advanced directives List of other physicians Hospitalizations, surgeries, and ER visits in previous 12 months Vitals Screenings to include cognitive, depression, and falls Referrals and appointments  In addition, I have reviewed and discussed with patient certain preventive protocols, quality metrics, and best practice recommendations. A written personalized care plan for preventive services as well as general preventive health recommendations were provided to patient.     Sheral Flow, LPN   [id number]   Nurse Notes:  Patient is cogitatively intact. There were no vitals filed for this visit. There is no height or weight on file to calculate BMI.

## 2020-11-10 NOTE — Patient Instructions (Signed)
Katie Higgins , Thank you for taking time to come for your Medicare Wellness Visit. I appreciate your ongoing commitment to your health goals. Please review the following plan we discussed and let me know if I can assist you in the future.   Screening recommendations/referrals: Colonoscopy: declined Mammogram: declined Bone Density: declined Recommended yearly ophthalmology/optometry visit for glaucoma screening and checkup Recommended yearly dental visit for hygiene and checkup  Vaccinations: Influenza vaccine: 01/22/2020; due Fall 2022 Pneumococcal vaccine: 05/21/2015, 10/31/2016 Tdap vaccine: 09/21/2015; due every 10 years Shingles vaccine: declined   Covid-19: declined  Advanced directives: Advance directive discussed with you today. Even though you declined this today please call our office should you change your mind and we can give you the proper paperwork for you to fill out.  Conditions/risks identified: Yes; no goals at this time.  Next appointment: Please schedule your next Medicare Wellness Visit with your Nurse Health Advisor in 1 year by calling [phone].   Preventive Care 71 Years and Older, Female Preventive care refers to lifestyle choices and visits with your health care provider that can promote health and wellness. What does preventive care include? A yearly physical exam. This is also called an annual well check. Dental exams once or twice a year. Routine eye exams. Ask your health care provider how often you should have your eyes checked. Personal lifestyle choices, including: Daily care of your teeth and gums. Regular physical activity. Eating a healthy diet. Avoiding tobacco and drug use. Limiting alcohol use. Practicing safe sex. Taking low-dose aspirin every day. Taking vitamin and mineral supplements as recommended by your health care provider. What happens during an annual well check? The services and screenings done by your health care provider  during your annual well check will depend on your age, overall health, lifestyle risk factors, and family history of disease. Counseling  Your health care provider may ask you questions about your: Alcohol use. Tobacco use. Drug use. Emotional well-being. Home and relationship well-being. Sexual activity. Eating habits. History of falls. Memory and ability to understand (cognition). Work and work Statistician. Reproductive health. Screening  You may have the following tests or measurements: Height, weight, and BMI. Blood pressure. Lipid and cholesterol levels. These may be checked every 5 years, or more frequently if you are over 5 years old. Skin check. Lung cancer screening. You may have this screening every year starting at age 70 if you have a 30-pack-year history of smoking and currently smoke or have quit within the past 15 years. Fecal occult blood test (FOBT) of the stool. You may have this test every year starting at age 62. Flexible sigmoidoscopy or colonoscopy. You may have a sigmoidoscopy every 5 years or a colonoscopy every 10 years starting at age 69. Hepatitis C blood test. Hepatitis B blood test. Sexually transmitted disease (STD) testing. Diabetes screening. This is done by checking your blood sugar (glucose) after you have not eaten for a while (fasting). You may have this done every 1-3 years. Bone density scan. This is done to screen for osteoporosis. You may have this done starting at age 24. Mammogram. This may be done every 1-2 years. Talk to your health care provider about how often you should have regular mammograms. Talk with your health care provider about your test results, treatment options, and if necessary, the need for more tests. Vaccines  Your health care provider may recommend certain vaccines, such as: Influenza vaccine. This is recommended every year. Tetanus, diphtheria, and acellular pertussis (Tdap, Td)  vaccine. You may need a Td booster every  10 years. Zoster vaccine. You may need this after age 56. Pneumococcal 13-valent conjugate (PCV13) vaccine. One dose is recommended after age 51. Pneumococcal polysaccharide (PPSV23) vaccine. One dose is recommended after age 2. Talk to your health care provider about which screenings and vaccines you need and how often you need them. This information is not intended to replace advice given to you by your health care provider. Make sure you discuss any questions you have with your health care provider. Document Released: 02/19/2015 Document Revised: 10/13/2015 Document Reviewed: 11/24/2014 Elsevier Interactive Patient Education  2017 [employer] Prevention in the Home Falls can cause injuries. They can happen to people of all ages. There are many things you can do to make your home safe and to help prevent falls. What can I do on the outside of my home? Regularly fix the edges of walkways and driveways and fix any cracks. Remove anything that might make you trip as you walk through a door, such as a raised step or threshold. Trim any bushes or trees on the path to your home. Use bright outdoor lighting. Clear any walking paths of anything that might make someone trip, such as rocks or tools. Regularly check to see if handrails are loose or broken. Make sure that both sides of any steps have handrails. Any raised decks and porches should have guardrails on the edges. Have any leaves, snow, or ice cleared regularly. Use sand or salt on walking paths during winter. Clean up any spills in your garage right away. This includes oil or grease spills. What can I do in the bathroom? Use night lights. Install grab bars by the toilet and in the tub and shower. Do not use towel bars as grab bars. Use non-skid mats or decals in the tub or shower. If you need to sit down in the shower, use a plastic, non-slip stool. Keep the floor dry. Clean up any water that spills on the floor as soon as it  happens. Remove soap buildup in the tub or shower regularly. Attach bath mats securely with double-sided non-slip rug tape. Do not have throw rugs and other things on the floor that can make you trip. What can I do in the bedroom? Use night lights. Make sure that you have a light by your bed that is easy to reach. Do not use any sheets or blankets that are too big for your bed. They should not hang down onto the floor. Have a firm chair that has side arms. You can use this for support while you get dressed. Do not have throw rugs and other things on the floor that can make you trip. What can I do in the kitchen? Clean up any spills right away. Avoid walking on wet floors. Keep items that you use a lot in easy-to-reach places. If you need to reach something above you, use a strong step stool that has a grab bar. Keep electrical cords out of the way. Do not use floor polish or wax that makes floors slippery. If you must use wax, use non-skid floor wax. Do not have throw rugs and other things on the floor that can make you trip. What can I do with my stairs? Do not leave any items on the stairs. Make sure that there are handrails on both sides of the stairs and use them. Fix handrails that are broken or loose. Make sure that handrails are as long  as the stairways. Check any carpeting to make sure that it is firmly attached to the stairs. Fix any carpet that is loose or worn. Avoid having throw rugs at the top or bottom of the stairs. If you do have throw rugs, attach them to the floor with carpet tape. Make sure that you have a light switch at the top of the stairs and the bottom of the stairs. If you do not have them, ask someone to add them for you. What else can I do to help prevent falls? Wear shoes that: Do not have high heels. Have rubber bottoms. Are comfortable and fit you well. Are closed at the toe. Do not wear sandals. If you use a stepladder: Make sure that it is fully opened.  Do not climb a closed stepladder. Make sure that both sides of the stepladder are locked into place. Ask someone to hold it for you, if possible. Clearly mark and make sure that you can see: Any grab bars or handrails. First and last steps. Where the edge of each step is. Use tools that help you move around (mobility aids) if they are needed. These include: Canes. Walkers. Scooters. Crutches. Turn on the lights when you go into a dark area. Replace any light bulbs as soon as they burn out. Set up your furniture so you have a clear path. Avoid moving your furniture around. If any of your floors are uneven, fix them. If there are any pets around you, be aware of where they are. Review your medicines with your doctor. Some medicines can make you feel dizzy. This can increase your chance of falling. Ask your doctor what other things that you can do to help prevent falls. This information is not intended to replace advice given to you by your health care provider. Make sure you discuss any questions you have with your health care provider. Document Released: 11/19/2008 Document Revised: 07/01/2015 Document Reviewed: 02/27/2014 Elsevier Interactive Patient Education  2017 [employer].

## 2020-11-15 NOTE — Progress Notes (Signed)
Cardiology Office Note Date:  11/24/2020  Patient ID:  Katie Higgins, Katie Higgins 1950/04/25, MRN [id number] PCP:  Hoyt Koch, MD  Cardiologist:  Dr. [name] (2017) EP: Dr. [name] Endo: Dr. [name]    Chief Complaint:   64movisit  History of Present Illness: Katie LARCOMis a 70y.o. female with history of hyperthyroidism, PVCs, DM, HLD, HTN, CKD (III), morbid obesity. 2011 w/u noted CM > LHC w/normal coronaries.   CM suspect 2/2 hyperthyroidism, and did improve with therapy.  She was referred to Dr. ARayann Hemanwho felt with minimial symptoms from PVCs and imporvement of her EF, pt preference to avoid invasive procedure, no plans for EP/ablation  She saw [name] NP 2018, described as essentially wheelchair bound, no symptoms of PVCs, planned to update her echo, if remained preserved LVEF to continue medical therapy. LVEF was 55-60% She has seen Dr. ARayann Hemanannually, most recently via tele health June 2020, doing well, no changes were made, planned for annual APP visit.  I saw her 07/14/2019 She is accompanied by her son today.  She is in a wheelchair, but reports that she is able to ambulates some.  She has bad OA of her hips and knees that is why she uses a wheelchair.  She says she can stand long enough to cook, wash up, able to do her ADLs independently.  She denies any palpitations or awareness of any PVCs if she is having any.  She will rarely get a fleeting/momentary "pang" in the center of her chest.  maybe the last was 6 mo or so ago.  No rest SOB, no DOE at her level of exertinal.  She denies symptoms of orthopnea or PND.   No dizzy spells, near syncope or syncope.  She has noticed swelling in her feet for the last 3 weeks, it is nearly if not completely resolved in the AM, but then some days uncomfortably swollen.  She notes this is associated with an aching discomfort to the very lateral edges of her feet, like she has been walking on the edge of her feet, or has a "rock  bruise"  Edema sounded more of a dependent edema, Recommend she take her lasix QD for 3 days then resume 3d/week, discussed minimizing sodium and elevating feet when seated.  If her lasix requirements remain increased, will update her echo, she is alsked to let uKoreaknow. No PVCs on her EKG.  01/12/20 with me She is accompanied by her son again today She lives in her own home, essentially wheelchair bound for ambulating 2/2 her OA and terrible knee pain, but once in the kitchen stands to cook, do dishes, etc  Able to care for herself. She denies any CP, palpitations or SOB, no cardiac awareness. She denies any symptoms of PND or orthopnea. Tolerating her medicines well No recurrent edema that she noticed  Exam was without extrasystoles, no symptoms or exam findings to suggest volume OL or cardiac changes.  Planned for 637moisit > 1 year if she remained clinically stable cardiac-wise.  TODAY Again, is accompanied by her son. She is doing well.  Had COVID early this year though did OK. 4m27moo she had a single episode of a fleeting/momentary flutter in her heart beat.  None otherwise with no cardiac awareness She will have a sense of chest heaviness/pulling when not wearing a bra particularly L breast/sided, that is resolved with wearing a bra and knows this is not her heart. No enar syncope  or syncope No edema  No changes I her baseline capacity to take care of her self, does self care well, able to get into and out of the chair by her self, just can not ambulate any real distance 2/2 severe OA of her hips/knees  Her endocrinologist released her to follow thyroid with her PMD, she also manages her lipids.  Past Medical History:  Diagnosis Date   ALLERGIC RHINITIS    CARDIOMYOPATHY    Nonischemic. Admitted in 3/11 with CHF exac, due to hyperthyroidism. LHC (03/11) showed clean coronaries with EF 40%. Myoview showed EF 38%. Echo was a technically difficult study with mild to moderately  decreased EF, mild LVH, mild MR. Repeat echo (9/11) with EF 24-40%, mild diastolic dysfunction (grade I).   CARPAL TUNNEL SYNDROME, LEFT, MILD    CKD (chronic kidney disease), stage III (HCC)    Congestive heart failure, unspecified    DIABETES MELLITUS, CONTROLLED    DYSLIPIDEMIA    Graves disease    HYPERTENSION    HYPERTHYROIDISM    Obesity    Osteoarthritis    esp L hip, low back   Palpitations    Premature ventricular contraction     Past Surgical History:  Procedure Laterality Date   CARDIAC CATHETERIZATION  04/19/09   CARDIAC CATHETERIZATION N/A 09/30/2015   Procedure: Left Heart Cath and Coronary Angiography;  Surgeon: Larey Dresser, MD;  Location: [facility];  Service: Cardiovascular;  Laterality: N/A;   CHOLECYSTECTOMY     remote hernia repair      Current Outpatient Medications  Medication Sig Dispense Refill   acetaminophen (TYLENOL) 500 MG tablet Take 500 mg by mouth every 6 (six) hours as needed for moderate pain.     aspirin 81 MG tablet Take 81 mg by mouth daily.     blood glucose meter kit and supplies Dispense based on patient and insurance preference. Use up to four times daily as directed. (FOR ICD-10 E10.9, E11.9). 1 each 0   carvedilol (COREG) 25 MG tablet Take 1 tablet (25 mg total) by mouth 2 (two) times daily. 180 tablet 1   CHLORPHENIRAMINE MALEATE PO Take by mouth.     colchicine 0.6 MG tablet Take 0.6 mg by mouth daily as needed. As directed     enalapril (VASOTEC) 10 MG tablet Take 1 tablet (10 mg total) by mouth every evening. 90 tablet 3   furosemide (LASIX) 20 MG tablet Take 1 tablet (20 mg total) by mouth 3 (three) times a week. 45 tablet 3   glucose blood (FREESTYLE TEST STRIPS) test strip Use to check blood sugars twice a day 300 each 1   lovastatin (MEVACOR) 10 MG tablet Take 1 tablet (10 mg total) by mouth at bedtime. 90 tablet 3   meloxicam (MOBIC) 15 MG tablet Take 1 tablet (15 mg total) by mouth daily. 90 tablet 1   methimazole  (TAPAZOLE) 5 MG tablet Take 1 tablet (5 mg total) by mouth 4 (four) times a week. 48 tablet 1   Multiple Vitamin (MULTIVITAMIN) tablet Take 1 tablet by mouth daily.     traMADol (ULTRAM) 50 MG tablet Take 1-2 tablets (50-100 mg total) by mouth daily as needed (pain). 60 tablet 5   No current facility-administered medications for this visit.    Allergies:   Latex, Wool alcohol [lanolin], and Amitriptyline   Social History:  The patient  reports that she has never smoked. She has never used smokeless tobacco. She reports that she does  not drink alcohol and does not use drugs.   Family History:  The patient's family history includes Asthma in an other family member; Breast cancer in her mother; Cervical cancer in her mother; Diabetes in her mother; Kidney cancer in her father; Stroke in her father; Supraventricular tachycardia in her sister.  ROS:  Please see the history of present illness.  All other systems are reviewed and otherwise negative.   PHYSICAL EXAM:  VS:  BP 118/68   Pulse 74   Ht 5' 7"  (1.702 m)   Wt 289 lb (131.1 kg)   SpO2 96%   BMI 45.26 kg/m  BMI: Body mass index is 45.26 kg/m. Well nourished, well developed, in no acute distress  HEENT: normocephalic, atraumatic  Neck: no JVD, carotid bruits or masses Cardiac:  RRR; no significant murmurs, no rubs, or gallops, no extrasystoles noted with prolonged auscultation Lungs:  CTA b/l, no wheezing, rhonchi or rales  Abd: soft, nontender,  she has a very large pannus MS: no deformity or atrophy Ext: no edema b/l Skin: warm and dry, no rash Neuro:  No gross deficits appreciated Psych: euthymic mood, full affect   EKG: not done today 03/17/20 EKGs previously reviewed:  SR 68bpm, no PVCs SR 79bpm, 2 PVCs  05/11/2016: TTE Study Conclusions  - Left ventricle: The cavity size was normal. Systolic function was    normal. The estimated ejection fraction was in the range of 55%    to 60%. Wall motion was normal; there were no  regional wall    motion abnormalities. Doppler parameters are consistent with    abnormal left ventricular relaxation (grade 1 diastolic    dysfunction). Doppler parameters are consistent with elevated    ventricular end-diastolic filling pressure. Mean left atrial    pressure appears to be normal at rest.    09/30/2015: LHC No obstructive coronary disease.  Small caliber distal LAD but similar to prior study.    Recent Labs: 03/17/2020: B Natriuretic Peptide 113.2 06/18/2020: ALT 9; BUN 39; Creatinine, Ser 1.49; Hemoglobin 12.3; Platelets 272.0; Potassium 4.5; Sodium 141; TSH 1.55  06/18/2020: Cholesterol 116; HDL 50.80; LDL Cholesterol 44; Total CHOL/HDL Ratio 2; Triglycerides 105.0; VLDL 21.0   CrCl cannot be calculated (Patient's most recent lab result is older than the maximum 21 days allowed.).   Wt Readings from Last 3 Encounters:  11/24/20 289 lb (131.1 kg)  06/18/20 289 lb (131.1 kg)  01/12/20 283 lb (128.4 kg)     Other studies reviewed: Additional studies/records reviewed today include: summarized above  ASSESSMENT AND PLAN:  1. PVCs      No symptoms to suggest any significant burden.     None noted again today with prolonged auscultation  2. NICM     Recovered LVEF, suspect 2/2 hyperthyroidism     No exam findings or symptoms to suggest change  Echo is from 2018, though clinically nothing to suggest change Continue BB/ACE, diuretic, lytels creat look stable by labs in May, Creat wobbles up/down over the years  3. HTN     Looks good, no changes   Disposition: Will see her again in a year, sooner if needed, she is comfortable with this..  Current medicines are reviewed at length with the patient today.  The patient did not have any concerns regarding medicines  Signed, Tommye Standard, PA-C 11/24/2020 2:47 PM     [address] [place] [address] [phone] (office)  [phone] (fax)

## 2020-11-24 ENCOUNTER — Encounter: Payer: Self-pay | Admitting: Physician Assistant

## 2020-11-24 ENCOUNTER — Other Ambulatory Visit: Payer: Self-pay

## 2020-11-24 ENCOUNTER — Ambulatory Visit (INDEPENDENT_AMBULATORY_CARE_PROVIDER_SITE_OTHER): Payer: Medicare Other | Admitting: Physician Assistant

## 2020-11-24 VITALS — BP 118/68 | HR 74 | Ht 67.0 in | Wt 289.0 lb

## 2020-11-24 DIAGNOSIS — I493 Ventricular premature depolarization: Secondary | ICD-10-CM | POA: Diagnosis not present

## 2020-11-24 DIAGNOSIS — I428 Other cardiomyopathies: Secondary | ICD-10-CM | POA: Diagnosis not present

## 2020-11-24 DIAGNOSIS — I10 Essential (primary) hypertension: Secondary | ICD-10-CM | POA: Diagnosis not present

## 2020-11-24 NOTE — Patient Instructions (Signed)
Medication Instructions:   Your physician recommends that you continue on your current medications as directed. Please refer to the Current Medication list given to you today.   *If you need a refill on your cardiac medications before your next appointment, please call your pharmacy*   Lab Work: [place]   If you have labs (blood work) drawn today and your tests are completely normal, you will receive your results only by: [place] (if you have MyChart) OR A paper copy in the mail If you have any lab test that is abnormal or we need to change your treatment, we will call you to review the results.   Testing/Procedures: NONE ORDERED  TODAY    Follow-Up: At [facility], you and your health needs are our priority.  As part of our continuing mission to provide you with exceptional heart care, we have created designated Provider Care Teams.  These Care Teams include your primary Cardiologist (physician) and Advanced Practice Providers (APPs -  Physician Assistants and Nurse Practitioners) who all work together to provide you with the care you need, when you need it.  We recommend signing up for the patient portal called "MyChart".  Sign up information is provided on this After Visit Summary.  MyChart is used to connect with patients for Virtual Visits (Telemedicine).  Patients are able to view lab/test results, encounter notes, upcoming appointments, etc.  Non-urgent messages can be sent to your provider as well.   To learn more about what you can do with MyChart, go to [email or web address].    Your next appointment:   1 year(s)  The format for your next appointment:   In Person  Provider:   You may see the following Advanced Practice Providers on your designated Care Team:   Tommye Standard, [place]    Other Instructions

## 2020-12-07 ENCOUNTER — Encounter: Payer: Self-pay | Admitting: Internal Medicine

## 2020-12-07 DIAGNOSIS — M1711 Unilateral primary osteoarthritis, right knee: Secondary | ICD-10-CM

## 2020-12-07 DIAGNOSIS — M1712 Unilateral primary osteoarthritis, left knee: Secondary | ICD-10-CM

## 2020-12-28 ENCOUNTER — Other Ambulatory Visit: Payer: Self-pay

## 2020-12-28 ENCOUNTER — Ambulatory Visit (INDEPENDENT_AMBULATORY_CARE_PROVIDER_SITE_OTHER): Payer: Medicare Other | Admitting: Internal Medicine

## 2020-12-28 ENCOUNTER — Encounter: Payer: Self-pay | Admitting: Internal Medicine

## 2020-12-28 VITALS — BP 130/70 | HR 75 | Resp 18 | Ht 67.0 in

## 2020-12-28 DIAGNOSIS — N1831 Chronic kidney disease, stage 3a: Secondary | ICD-10-CM | POA: Diagnosis not present

## 2020-12-28 DIAGNOSIS — E1169 Type 2 diabetes mellitus with other specified complication: Secondary | ICD-10-CM

## 2020-12-28 DIAGNOSIS — E785 Hyperlipidemia, unspecified: Secondary | ICD-10-CM | POA: Diagnosis not present

## 2020-12-28 DIAGNOSIS — M1712 Unilateral primary osteoarthritis, left knee: Secondary | ICD-10-CM | POA: Diagnosis not present

## 2020-12-28 DIAGNOSIS — Z8639 Personal history of other endocrine, nutritional and metabolic disease: Secondary | ICD-10-CM | POA: Diagnosis not present

## 2020-12-28 DIAGNOSIS — Z23 Encounter for immunization: Secondary | ICD-10-CM

## 2020-12-28 DIAGNOSIS — M1711 Unilateral primary osteoarthritis, right knee: Secondary | ICD-10-CM | POA: Diagnosis not present

## 2020-12-28 MED ORDER — TRAMADOL HCL 50 MG PO TABS: 50.0000 mg | ORAL_TABLET | Freq: Every day | ORAL | 5 refills | Status: DC | PRN

## 2020-12-28 MED ORDER — DICLOFENAC SODIUM 75 MG PO TBEC: 75.0000 mg | DELAYED_RELEASE_TABLET | Freq: Two times a day (BID) | ORAL | 3 refills | Status: DC

## 2020-12-28 NOTE — Patient Instructions (Addendum)
We will get the diclofenac to the mail order that you can take twice a day and stop taking the meloxicam.  We have sent the tramadol to the [facility] to pickup.

## 2020-12-28 NOTE — Progress Notes (Signed)
   Subjective:   Patient ID: Katie Higgins, female    DOB: [birth year], 70 y.o.   MRN: [id number]  HPI The patient is a 70 YO female coming in for ongoing care of multiple conditions.   Review of Systems  Constitutional: Negative.   HENT: Negative.    Eyes: Negative.   Respiratory:  Negative for cough, chest tightness and shortness of breath.   Cardiovascular:  Negative for chest pain, palpitations and leg swelling.  Gastrointestinal:  Negative for abdominal distention, abdominal pain, constipation, diarrhea, nausea and vomiting.  Musculoskeletal:  Positive for arthralgias, back pain and gait problem.  Skin: Negative.   Psychiatric/Behavioral: Negative.     Objective:  Physical Exam Constitutional:      Appearance: She is well-developed. She is obese.  HENT:     Head: Normocephalic and atraumatic.  Cardiovascular:     Rate and Rhythm: Normal rate and regular rhythm.  Pulmonary:     Effort: Pulmonary effort is normal. No respiratory distress.     Breath sounds: Normal breath sounds. No wheezing or rales.  Abdominal:     General: Bowel sounds are normal. There is no distension.     Palpations: Abdomen is soft.     Tenderness: There is no abdominal tenderness. There is no rebound.  Musculoskeletal:     Cervical back: Normal range of motion.  Skin:    General: Skin is warm and dry.  Neurological:     Mental Status: She is alert and oriented to person, place, and time.     Coordination: Coordination normal.    Vitals:   12/28/20 1527  BP: 130/70  Pulse: 75  Resp: 18  SpO2: 98%  Height: 5\' 7"  (1.702 m)    This visit occurred during the SARS-CoV-2 public health emergency.  Safety protocols were in place, including screening questions prior to the visit, additional usage of staff PPE, and extensive cleaning of exam room while observing appropriate contact time as indicated for disinfecting solutions.   Assessment & Plan:  Flu shot given at visit

## 2020-12-29 ENCOUNTER — Encounter: Payer: Self-pay | Admitting: Internal Medicine

## 2020-12-29 LAB — COMPREHENSIVE METABOLIC PANEL
ALT: 11 U/L (ref 0–35)
AST: 15 U/L (ref 0–37)
Albumin: 4.2 g/dL (ref 3.5–5.2)
Alkaline Phosphatase: 66 U/L (ref 39–117)
BUN: 32 mg/dL — ABNORMAL HIGH (ref 6–23)
CO2: 25 mEq/L (ref 19–32)
Calcium: 9.1 mg/dL (ref 8.4–10.5)
Chloride: 109 mEq/L (ref 96–112)
Creatinine, Ser: 1.3 mg/dL — ABNORMAL HIGH (ref 0.40–1.20)
GFR: 41.44 mL/min — ABNORMAL LOW (ref >60.00)
Glucose, Bld: 112 mg/dL — ABNORMAL HIGH (ref 70–99)
Potassium: 5 mEq/L (ref 3.5–5.1)
Sodium: 140 mEq/L (ref 135–145)
Total Bilirubin: 0.8 mg/dL (ref 0.2–1.2)
Total Protein: 7.5 g/dL (ref 6.0–8.3)

## 2020-12-29 LAB — HEMOGLOBIN A1C: Hgb A1c MFr Bld: 6.2 % (ref 4.6–6.5)

## 2020-12-29 NOTE — Assessment & Plan Note (Signed)
Reviewed recent lipid panel and she is not on statin currently and this is at goal.

## 2020-12-29 NOTE — Assessment & Plan Note (Signed)
Continue with monitoring every 6-12 months HgA1c. Foot exam done. She is not on medication and recent HgA1c not in diabetes range.

## 2020-12-29 NOTE — Assessment & Plan Note (Signed)
Will change meloxicam to diclofenac as she prefers a BID medication and feels that meloxicam is not providing relief past 12 hours. D/C meloxicam and rx diclofenac 75 mg BID.

## 2020-12-29 NOTE — Assessment & Plan Note (Signed)
Needs recheck CMP today as last was elevated compared to normal. She is on chronic NSAIDS and if persistent change may need to consider stopping these.

## 2020-12-29 NOTE — Assessment & Plan Note (Signed)
Taking tramadol 1-2 pills daily and she states that she sometimes takes only once. Reviewed [place] narcotic database which shows she is filling monthly and sometimes early. She feels that this reporting is due to her mail order company having to fill and ship so she does not run out. We will switch to local pharmacy for more accurate information and rechecking UDS today as last did not have enough sample to run tramadol check and this is the medication she takes. Visits every 6 months are appropriate depending on results. Refilled tramadol today.

## 2020-12-30 LAB — PRESCRIBED DRUGS,MEDMATCH(R)

## 2020-12-30 LAB — DM TEMPLATE

## 2020-12-30 LAB — DRUG MONITOR, TRAMADOL,QN, URINE
Desmethyltramadol: 733 ng/mL — ABNORMAL HIGH (ref <100)
Tramadol: 4043 ng/mL — ABNORMAL HIGH (ref <100)

## 2020-12-30 LAB — DRUG MONITOR, OPIATES,W/CONF, URINE: Opiates: NEGATIVE ng/mL (ref <100)

## 2020-12-30 LAB — DRUG MONITOR,AMPHETAMINE,W/CONF, URINE: Amphetamines: NEGATIVE ng/mL (ref <500)

## 2020-12-30 LAB — DRUG MONITOR, OXYCODONE,W/CONF, URINE: Oxycodone: NEGATIVE ng/mL (ref <100)

## 2020-12-30 LAB — DRUG MONITOR,BARBITURATE,W/CONF, URINE: Barbiturates: NEGATIVE ng/mL (ref <300)

## 2020-12-30 LAB — DRUG MONITOR, COCAINEMETAB, W/CONF, URINE: Cocaine Metabolite: NEGATIVE ng/mL (ref <150)

## 2020-12-30 LAB — DRUG MONITOR, BENZO,W/CONF, URINE: Benzodiazepines: NEGATIVE ng/mL (ref <100)

## 2021-02-10 ENCOUNTER — Encounter: Payer: Self-pay | Admitting: Internal Medicine

## 2021-03-11 ENCOUNTER — Encounter: Payer: Self-pay | Admitting: Internal Medicine

## 2021-03-11 MED ORDER — ONDANSETRON HCL 4 MG PO TABS: 4.0000 mg | ORAL_TABLET | Freq: Three times a day (TID) | ORAL | 0 refills | Status: DC | PRN

## 2021-03-24 DIAGNOSIS — R0602 Shortness of breath: Secondary | ICD-10-CM

## 2021-03-24 DIAGNOSIS — I5032 Chronic diastolic (congestive) heart failure: Secondary | ICD-10-CM

## 2021-03-25 MED ORDER — CARVEDILOL 25 MG PO TABS: 25.0000 mg | ORAL_TABLET | Freq: Two times a day (BID) | ORAL | 2 refills | Status: DC

## 2021-05-10 ENCOUNTER — Telehealth: Payer: Self-pay

## 2021-05-10 NOTE — Telephone Encounter (Signed)
Patient last seen 01/22/20 and requests refill of Methimazole 5 mg tablet, she also mentions that she will like to stay within the practice.  ?

## 2021-05-10 NOTE — Telephone Encounter (Signed)
Spoke with the patient to see if she can come in tomorrow and says that she is unable to do tomorrow due to having other things scheduled. Patient says that she does have enough medication to last her till May and will call back to schedule at her earliest convenience.  ?

## 2021-05-12 ENCOUNTER — Ambulatory Visit (INDEPENDENT_AMBULATORY_CARE_PROVIDER_SITE_OTHER): Payer: Medicare Other | Admitting: Endocrinology

## 2021-05-12 ENCOUNTER — Encounter: Payer: Self-pay | Admitting: Endocrinology

## 2021-05-12 VITALS — BP 158/90 | Ht 67.0 in | Wt 306.0 lb

## 2021-05-12 DIAGNOSIS — E118 Type 2 diabetes mellitus with unspecified complications: Secondary | ICD-10-CM | POA: Diagnosis not present

## 2021-05-12 DIAGNOSIS — E0580 Other thyrotoxicosis without thyrotoxic crisis or storm: Secondary | ICD-10-CM

## 2021-05-12 LAB — POCT GLYCOSYLATED HEMOGLOBIN (HGB A1C): Hemoglobin A1C: 6.1 % — AB (ref 4.0–5.6)

## 2021-05-12 LAB — T4, FREE: Free T4: 1.16 ng/dL (ref 0.60–1.60)

## 2021-05-12 LAB — TSH: TSH: 2.29 u[IU]/mL (ref 0.35–5.50)

## 2021-05-12 NOTE — Progress Notes (Signed)
? ?Subjective:  ? ? Patient ID: Katie Higgins, female    DOB: May 08, 1950, 71 y.o.   MRN: [id number] ? ?HPI ?Pt returns for f/u of hyperthyroidism (due to [place]; dx'ed 2010; US showed heterogeneity of texture and one small nodule; she has been unable to safely d/c tapazole for RAI rx, due to CHF).  she denies palpitations.  Anxiety is mild.  She takes tapazole as rx'ed.   ?Past Medical History:  ?Diagnosis Date  ? ALLERGIC RHINITIS   ? CARDIOMYOPATHY   ? Nonischemic. Admitted in 3/11 with CHF exac, due to hyperthyroidism. LHC (03/11) showed clean coronaries with EF 40%. Myoview showed EF 38%. Echo was a technically difficult study with mild to moderately decreased EF, mild LVH, mild MR. Repeat echo (9/11) with EF 19-41%, mild diastolic dysfunction (grade I).  ? CARPAL TUNNEL SYNDROME, LEFT, MILD   ? CKD (chronic kidney disease), stage III ([facility])   ? Congestive heart failure, unspecified   ? DIABETES MELLITUS, CONTROLLED   ? DYSLIPIDEMIA   ? Graves disease   ? HYPERTENSION   ? HYPERTHYROIDISM   ? Obesity   ? Osteoarthritis   ? esp L hip, low back  ? Palpitations   ? Premature ventricular contraction   ? ? ?Past Surgical History:  ?Procedure Laterality Date  ? CARDIAC CATHETERIZATION  04/19/09  ? CARDIAC CATHETERIZATION N/A 09/30/2015  ? Procedure: Left Heart Cath and Coronary Angiography;  Surgeon: Larey Dresser, MD;  Location: [facility];  Service: Cardiovascular;  Laterality: N/A;  ? CHOLECYSTECTOMY    ? remote hernia repair    ? ? ?Social History  ? ?Socioeconomic History  ? Marital status: Widowed  ?  Spouse name: Not on file  ? Number of children: Not on file  ? Years of education: Not on file  ? Highest education level: Not on file  ?Occupational History  ? Not on file  ?Tobacco Use  ? Smoking status: Never  ? Smokeless tobacco: Never  ?Vaping Use  ? Vaping Use: Never used  ?Substance and Sexual Activity  ? Alcohol use: No  ? Drug use: No  ? Sexual activity: Not on file  ?Other Topics Concern  ?  Not on file  ?Social History Narrative  ? Lives in [place] with her son.  ? Widowed since 1992.  ? She is very sedentary, uses a wheelchair when out of the house.  ? Ambulatory in home with cane.  ? ?Social Determinants of Health  ? ?Financial Resource Strain: Low Risk   ? Difficulty of Paying Living Expenses: Not hard at all  ?Food Insecurity: No Food Insecurity  ? Worried About Charity fundraiser in the Last Year: Never true  ? Ran Out of Food in the Last Year: Never true  ?Transportation Needs: No Transportation Needs  ? Lack of Transportation (Medical): No  ? Lack of Transportation (Non-Medical): No  ?Physical Activity: Inactive  ? Days of Exercise per Week: 0 days  ? Minutes of Exercise per Session: 0 min  ?Stress: No Stress Concern Present  ? Feeling of Stress : Not at all  ?Social Connections: Socially Isolated  ? Frequency of Communication with Friends and Family: More than three times a week  ? Frequency of Social Gatherings with Friends and Family: More than three times a week  ? Attends Religious Services: Never  ? Active Member of Clubs or Organizations: No  ? Attends Archivist Meetings: Never  ? Marital Status: Widowed  ?  Intimate Partner Violence: Not At Risk  ? Fear of Current or Ex-Partner: No  ? Emotionally Abused: No  ? Physically Abused: No  ? Sexually Abused: No  ? ? ?Current Outpatient Medications on File Prior to Visit  ?Medication Sig Dispense Refill  ? acetaminophen (TYLENOL) 500 MG tablet Take 500 mg by mouth every 6 (six) hours as needed for moderate pain.    ? aspirin 81 MG tablet Take 81 mg by mouth daily.    ? blood glucose meter kit and supplies Dispense based on patient and insurance preference. Use up to four times daily as directed. (FOR ICD-10 E10.9, E11.9). 1 each 0  ? carvedilol (COREG) 25 MG tablet Take 1 tablet (25 mg total) by mouth 2 (two) times daily. 180 tablet 2  ? CHLORPHENIRAMINE MALEATE PO Take by mouth.    ? colchicine 0.6 MG tablet Take 0.6 mg by mouth  daily as needed. As directed    ? diclofenac (VOLTAREN) 75 MG EC tablet Take 1 tablet (75 mg total) by mouth 2 (two) times daily. 180 tablet 3  ? enalapril (VASOTEC) 10 MG tablet Take 1 tablet (10 mg total) by mouth every evening. 90 tablet 3  ? furosemide (LASIX) 20 MG tablet Take 1 tablet (20 mg total) by mouth 3 (three) times a week. 45 tablet 3  ? glucose blood (FREESTYLE TEST STRIPS) test strip Use to check blood sugars twice a day 300 each 1  ? lovastatin (MEVACOR) 10 MG tablet Take 1 tablet (10 mg total) by mouth at bedtime. 90 tablet 3  ? methimazole (TAPAZOLE) 5 MG tablet Take 1 tablet (5 mg total) by mouth 4 (four) times a week. 48 tablet 1  ? Multiple Vitamin (MULTIVITAMIN) tablet Take 1 tablet by mouth daily.    ? ondansetron (ZOFRAN) 4 MG tablet Take 1 tablet (4 mg total) by mouth every 8 (eight) hours as needed for nausea or vomiting. 20 tablet 0  ? traMADol (ULTRAM) 50 MG tablet Take 1-2 tablets (50-100 mg total) by mouth daily as needed (pain). 60 tablet 5  ? [DISCONTINUED] cyclobenzaprine (FLEXERIL) 5 MG tablet Take 1 tablet (5 mg total) by mouth 3 (three) times daily as needed for muscle spasms. 30 tablet 0  ? ?No current facility-administered medications on file prior to visit.  ? ? ?Allergies  ?Allergen Reactions  ? Latex Rash  ? Wool Alcohol [Lanolin] Rash  ? Amitriptyline Other (See Comments)  ?  Leg cramps, trimmers ?  ? ? ?Family History  ?Problem Relation Age of Onset  ? Diabetes Mother   ? Breast cancer Mother   ? Cervical cancer Mother   ? Kidney cancer Father   ?     Kidney Cancer  ? Stroke Father   ?     CVA  ? Supraventricular tachycardia Sister   ? Asthma Other   ?     family history  ? ? ?BP (!) 158/90   Ht _0  (1.702 m)   Wt (!) 306 lb (138.8 kg)   BMI 47.93 kg/m?  ? ? ?Review of Systems ?Denies fever.   ?   ?Objective:  ? Physical Exam ?VITAL SIGNS:  See vs page.   ?GENERAL: no distress.   ?NECK: There is no palpable thyroid enlargement.  No thyroid nodule is palpable.  No  palpable lymphadenopathy at the anterior neck.   ? ? ?Lab Results  ?Component Value Date  ? TSH 2.29 05/12/2021  ? ?   ?Assessment & Plan:  ?  Hyperthyroidism: well-controlled.  Please continue the same methimazole.  ? ?

## 2021-05-12 NOTE — Patient Instructions (Addendum)
Blood tests are requested for you today.  We'll let you know about the results.   ?If ever you have fever while taking methimazole, stop it and call us, even if the reason is obvious, because of the risk of a rare side-effect.   ?It is best to never miss the medication.  However, if you do miss it, next best is to double up the next time.   ?You should have a follow-up endocrinology appointment in 6 months.   ?

## 2021-05-23 ENCOUNTER — Other Ambulatory Visit: Payer: Self-pay | Admitting: Endocrinology

## 2021-05-23 DIAGNOSIS — E041 Nontoxic single thyroid nodule: Secondary | ICD-10-CM

## 2021-05-24 ENCOUNTER — Encounter: Payer: Self-pay | Admitting: Endocrinology

## 2021-08-21 DIAGNOSIS — R0602 Shortness of breath: Secondary | ICD-10-CM

## 2021-08-21 DIAGNOSIS — I5032 Chronic diastolic (congestive) heart failure: Secondary | ICD-10-CM

## 2021-08-21 DIAGNOSIS — I493 Ventricular premature depolarization: Secondary | ICD-10-CM

## 2021-08-22 MED ORDER — ENALAPRIL MALEATE 10 MG PO TABS: 10.0000 mg | ORAL_TABLET | Freq: Every evening | ORAL | 0 refills | Status: DC

## 2021-08-22 MED ORDER — LOVASTATIN 10 MG PO TABS: 10.0000 mg | ORAL_TABLET | Freq: Every day | ORAL | 0 refills | Status: DC

## 2021-09-09 ENCOUNTER — Encounter: Payer: Self-pay | Admitting: Internal Medicine

## 2021-09-15 ENCOUNTER — Other Ambulatory Visit: Payer: Self-pay | Admitting: Internal Medicine

## 2021-09-15 DIAGNOSIS — M1712 Unilateral primary osteoarthritis, left knee: Secondary | ICD-10-CM

## 2021-09-15 DIAGNOSIS — M1711 Unilateral primary osteoarthritis, right knee: Secondary | ICD-10-CM

## 2021-09-15 MED ORDER — TRAMADOL HCL 50 MG PO TABS: 50.0000 mg | ORAL_TABLET | Freq: Every day | ORAL | 0 refills | Status: DC | PRN

## 2021-10-24 ENCOUNTER — Encounter: Payer: Self-pay | Admitting: Internal Medicine

## 2021-10-25 NOTE — Telephone Encounter (Signed)
My note clearly states visit every 6 months. She was given 6 month supply at her last visit. This was filled generously by another provider while I was out with the recommendation she needed a visit for refills. Should have visit for refill. I am okay with her moving apt up to accommodate not running out.

## 2021-10-25 NOTE — Telephone Encounter (Signed)
Pt has made an appt for her yearly checkup for Oct 2nd and is wondering if you could send in more Tramadol to last her up until her appt? She reports she only has enough to get her to the 25th.

## 2021-11-07 ENCOUNTER — Ambulatory Visit (INDEPENDENT_AMBULATORY_CARE_PROVIDER_SITE_OTHER): Payer: Medicare Other | Admitting: Internal Medicine

## 2021-11-07 ENCOUNTER — Encounter: Payer: Self-pay | Admitting: Internal Medicine

## 2021-11-07 VITALS — BP 146/82 | HR 80 | Temp 98.4°F

## 2021-11-07 DIAGNOSIS — N1831 Chronic kidney disease, stage 3a: Secondary | ICD-10-CM

## 2021-11-07 DIAGNOSIS — M1712 Unilateral primary osteoarthritis, left knee: Secondary | ICD-10-CM

## 2021-11-07 DIAGNOSIS — M5416 Radiculopathy, lumbar region: Secondary | ICD-10-CM | POA: Diagnosis not present

## 2021-11-07 DIAGNOSIS — E1169 Type 2 diabetes mellitus with other specified complication: Secondary | ICD-10-CM | POA: Diagnosis not present

## 2021-11-07 DIAGNOSIS — E041 Nontoxic single thyroid nodule: Secondary | ICD-10-CM | POA: Diagnosis not present

## 2021-11-07 DIAGNOSIS — Z8639 Personal history of other endocrine, nutritional and metabolic disease: Secondary | ICD-10-CM | POA: Diagnosis not present

## 2021-11-07 DIAGNOSIS — I10 Essential (primary) hypertension: Secondary | ICD-10-CM

## 2021-11-07 DIAGNOSIS — Z23 Encounter for immunization: Secondary | ICD-10-CM | POA: Diagnosis not present

## 2021-11-07 DIAGNOSIS — I5032 Chronic diastolic (congestive) heart failure: Secondary | ICD-10-CM | POA: Diagnosis not present

## 2021-11-07 DIAGNOSIS — E785 Hyperlipidemia, unspecified: Secondary | ICD-10-CM

## 2021-11-07 DIAGNOSIS — M1711 Unilateral primary osteoarthritis, right knee: Secondary | ICD-10-CM | POA: Diagnosis not present

## 2021-11-07 LAB — CBC
HCT: 34.3 % — ABNORMAL LOW (ref 36.0–46.0)
Hemoglobin: 11.3 g/dL — ABNORMAL LOW (ref 12.0–15.0)
MCHC: 33 g/dL (ref 30.0–36.0)
MCV: 87.8 fl (ref 78.0–100.0)
Platelets: 261 10*3/uL (ref 150.0–400.0)
RBC: 3.9 Mil/uL (ref 3.87–5.11)
RDW: 15.6 % — ABNORMAL HIGH (ref 11.5–15.5)
WBC: 7.1 10*3/uL (ref 4.0–10.5)

## 2021-11-07 LAB — LIPID PANEL
Cholesterol: 109 mg/dL (ref 0–200)
HDL: 46.2 mg/dL (ref >39.00)
LDL Cholesterol: 47 mg/dL (ref 0–99)
NonHDL: 62.68
Total CHOL/HDL Ratio: 2
Triglycerides: 76 mg/dL (ref 0.0–149.0)
VLDL: 15.2 mg/dL (ref 0.0–40.0)

## 2021-11-07 LAB — COMPREHENSIVE METABOLIC PANEL
ALT: 8 U/L (ref 0–35)
AST: 11 U/L (ref 0–37)
Albumin: 3.8 g/dL (ref 3.5–5.2)
Alkaline Phosphatase: 67 U/L (ref 39–117)
BUN: 26 mg/dL — ABNORMAL HIGH (ref 6–23)
CO2: 22 mEq/L (ref 19–32)
Calcium: 8.4 mg/dL (ref 8.4–10.5)
Chloride: 110 mEq/L (ref 96–112)
Creatinine, Ser: 1.39 mg/dL — ABNORMAL HIGH (ref 0.40–1.20)
GFR: 38.01 mL/min — ABNORMAL LOW (ref >60.00)
Glucose, Bld: 100 mg/dL — ABNORMAL HIGH (ref 70–99)
Potassium: 5.2 mEq/L — ABNORMAL HIGH (ref 3.5–5.1)
Sodium: 139 mEq/L (ref 135–145)
Total Bilirubin: 0.5 mg/dL (ref 0.2–1.2)
Total Protein: 7 g/dL (ref 6.0–8.3)

## 2021-11-07 LAB — HEMOGLOBIN A1C: Hgb A1c MFr Bld: 6 % (ref 4.6–6.5)

## 2021-11-07 LAB — TSH: TSH: 2.54 u[IU]/mL (ref 0.35–5.50)

## 2021-11-07 LAB — T4, FREE: Free T4: 1.03 ng/dL (ref 0.60–1.60)

## 2021-11-07 MED ORDER — TRAMADOL HCL 50 MG PO TABS: 50.0000 mg | ORAL_TABLET | Freq: Every day | ORAL | 5 refills | Status: DC | PRN

## 2021-11-07 NOTE — Progress Notes (Unsigned)
   Subjective:   Patient ID: Katie Higgins, female    DOB: 12-23-1950, 71 y.o.   MRN: [id number]  HPI The patient is a 71 YO female coming in for follow up.   Review of Systems  Objective:  Physical Exam  Vitals:   11/07/21 1424  BP: (!) 146/82  Pulse: 80  Temp: 98.4 F (36.9 C)  SpO2: 97%    Assessment & Plan:

## 2021-11-07 NOTE — Patient Instructions (Signed)
We will check the labs today. 

## 2021-11-09 NOTE — Assessment & Plan Note (Signed)
Weight is not decreased. Counseled that this is impacting her arthritis causing this to accelerate. Counseled about diet and unable to exercise currently.

## 2021-11-09 NOTE — Assessment & Plan Note (Signed)
BP mildly elevated today and she has not taken meds due to lasix making her urinate. She does take coreg 25 mg BID and enalapril 10 mg daily and lasix 20 mg every other day. Checking CMP and adjust as needed. Typically at goal she is asked to monitor at home.

## 2021-11-09 NOTE — Assessment & Plan Note (Signed)
Checking CMP and adjust as needed.  

## 2021-11-09 NOTE — Assessment & Plan Note (Signed)
Overall progressive and limits walking significantly. Uses tramadol 50-100 mg daily for pain. Unable to be active which likely makes pain worse.

## 2021-11-09 NOTE — Assessment & Plan Note (Signed)
Checking TSH and free T4 and adjust as needed methimazole 5 mg taken 4 times a week. Currently between endo providers and no recent labs.

## 2021-11-09 NOTE — Assessment & Plan Note (Signed)
No flare today. Taking coreg 25 mg BID. Lasix 20 mg every other day. Is on ACE-I. Continue current care.

## 2021-11-09 NOTE — Assessment & Plan Note (Signed)
Tramadol 1-2 pills daily. [place] database reviewed and she was overdue for visit. We do require visit every 6 months and encouraged to schedule in advance to avoid gap in treatment. Due for UDS at next visit. Refilled medication today to last until next visit.

## 2021-11-09 NOTE — Assessment & Plan Note (Signed)
Checking lipid panel and adjust lovastatin 10 mg daily as needed.

## 2021-11-09 NOTE — Assessment & Plan Note (Signed)
Diet controlled and in pre-diabetes range for some time. Checking HgA1c today.

## 2021-11-15 ENCOUNTER — Ambulatory Visit: Payer: Medicare Other

## 2021-11-15 ENCOUNTER — Telehealth: Payer: Self-pay

## 2021-11-15 NOTE — Telephone Encounter (Signed)
Called patient lvm to return call, to complete AWV. Patient may reschedule for the next available appointment [facility] or [facility]. -S. Vaibhav Fogleman,LPN 

## 2021-11-21 ENCOUNTER — Other Ambulatory Visit: Payer: Self-pay

## 2021-11-21 DIAGNOSIS — I493 Ventricular premature depolarization: Secondary | ICD-10-CM

## 2021-11-21 DIAGNOSIS — I5032 Chronic diastolic (congestive) heart failure: Secondary | ICD-10-CM

## 2021-11-21 DIAGNOSIS — R0602 Shortness of breath: Secondary | ICD-10-CM

## 2021-11-21 MED ORDER — ENALAPRIL MALEATE 10 MG PO TABS: 10.0000 mg | ORAL_TABLET | Freq: Every evening | ORAL | 0 refills | Status: DC

## 2021-11-21 MED ORDER — LOVASTATIN 10 MG PO TABS: 10.0000 mg | ORAL_TABLET | Freq: Every day | ORAL | 0 refills | Status: DC

## 2021-11-23 ENCOUNTER — Ambulatory Visit (INDEPENDENT_AMBULATORY_CARE_PROVIDER_SITE_OTHER): Payer: Medicare Other

## 2021-11-23 ENCOUNTER — Other Ambulatory Visit: Payer: Self-pay | Admitting: Internal Medicine

## 2021-11-23 DIAGNOSIS — Z0000 Encounter for general adult medical examination without abnormal findings: Secondary | ICD-10-CM | POA: Diagnosis not present

## 2021-11-23 DIAGNOSIS — I5032 Chronic diastolic (congestive) heart failure: Secondary | ICD-10-CM

## 2021-11-23 DIAGNOSIS — I493 Ventricular premature depolarization: Secondary | ICD-10-CM

## 2021-11-23 MED ORDER — FUROSEMIDE 20 MG PO TABS: 20.0000 mg | ORAL_TABLET | ORAL | 0 refills | Status: AC

## 2021-11-23 MED ORDER — CARVEDILOL 25 MG PO TABS: 25.0000 mg | ORAL_TABLET | Freq: Two times a day (BID) | ORAL | 0 refills | Status: DC

## 2021-11-23 NOTE — Progress Notes (Signed)
I connected with  Wray Kearns on 11/23/21 by a audio enabled telemedicine application and verified that I am speaking with the correct person using two identifiers.  Patient Location: Home  Provider Location: Home Office  I discussed the limitations of evaluation and management by telemedicine. The patient expressed understanding and agreed to proceed.   Subjective:   ADYLINE HUBERTY is a 71 y.o. female who presents for Medicare Annual (Subsequent) preventive examination.  Review of Systems     Cardiac Risk Factors include: advanced age (>79mn, >>90women);hypertension;dyslipidemia;diabetes mellitus;obesity (BMI >30kg/m2)     Objective:    There were no vitals filed for this visit. There is no height or weight on file to calculate BMI.     11/23/2021    1:22 PM 11/10/2020    4:06 PM 09/30/2015    9:07 AM 05/20/2015    1:06 AM  Advanced Directives  Does Patient Have a Medical Advance Directive? No No No No  Would patient like information on creating a medical advance directive? No - Patient declined No - Patient declined No - patient declined information No - patient declined information    Current Medications (verified) Outpatient Encounter Medications as of 11/23/2021  Medication Sig   acetaminophen (TYLENOL) 500 MG tablet Take 500 mg by mouth every 6 (six) hours as needed for moderate pain.   aspirin 81 MG tablet Take 81 mg by mouth daily.   blood glucose meter kit and supplies Dispense based on patient and insurance preference. Use up to four times daily as directed. (FOR ICD-10 E10.9, E11.9).   carvedilol (COREG) 25 MG tablet Take 1 tablet (25 mg total) by mouth 2 (two) times daily.   CHLORPHENIRAMINE MALEATE PO Take by mouth.   colchicine 0.6 MG tablet Take 0.6 mg by mouth daily as needed. As directed   diclofenac (VOLTAREN) 75 MG EC tablet Take 1 tablet (75 mg total) by mouth 2 (two) times daily.   enalapril (VASOTEC) 10 MG tablet Take 1 tablet (10 mg total) by  mouth every evening.   furosemide (LASIX) 20 MG tablet Take 1 tablet (20 mg total) by mouth 3 (three) times a week.   glucose blood (FREESTYLE TEST STRIPS) test strip Use to check blood sugars twice a day   lovastatin (MEVACOR) 10 MG tablet Take 1 tablet (10 mg total) by mouth at bedtime.   methimazole (TAPAZOLE) 5 MG tablet TAKE ONE TABLET BY MOUTH FOUR TIMES A WEEK   Multiple Vitamin (MULTIVITAMIN) tablet Take 1 tablet by mouth daily.   ondansetron (ZOFRAN) 4 MG tablet Take 1 tablet (4 mg total) by mouth every 8 (eight) hours as needed for nausea or vomiting.   traMADol (ULTRAM) 50 MG tablet Take 1-2 tablets (50-100 mg total) by mouth daily as needed (pain).   [DISCONTINUED] carvedilol (COREG) 25 MG tablet Take 1 tablet (25 mg total) by mouth 2 (two) times daily.   [DISCONTINUED] cyclobenzaprine (FLEXERIL) 5 MG tablet Take 1 tablet (5 mg total) by mouth 3 (three) times daily as needed for muscle spasms.   [DISCONTINUED] furosemide (LASIX) 20 MG tablet Take 1 tablet (20 mg total) by mouth 3 (three) times a week.   No facility-administered encounter medications on file as of 11/23/2021.    Allergies (verified) Latex, Wool alcohol [lanolin], and Amitriptyline   History: Past Medical History:  Diagnosis Date   ALLERGIC RHINITIS    CARDIOMYOPATHY    Nonischemic. Admitted in 3/11 with CHF exac, due to hyperthyroidism. LHC (03/11) showed clean  coronaries with EF 40%. Myoview showed EF 38%. Echo was a technically difficult study with mild to moderately decreased EF, mild LVH, mild MR. Repeat echo (9/11) with EF 26-94%, mild diastolic dysfunction (grade I).   CARPAL TUNNEL SYNDROME, LEFT, MILD    CKD (chronic kidney disease), stage III (HCC)    Congestive heart failure, unspecified    DIABETES MELLITUS, CONTROLLED    DYSLIPIDEMIA    Graves disease    HYPERTENSION    HYPERTHYROIDISM    Obesity    Osteoarthritis    esp L hip, low back   Palpitations    Premature ventricular contraction     Past Surgical History:  Procedure Laterality Date   CARDIAC CATHETERIZATION  04/19/09   CARDIAC CATHETERIZATION N/A 09/30/2015   Procedure: Left Heart Cath and Coronary Angiography;  Surgeon: Larey Dresser, MD;  Location: [facility];  Service: Cardiovascular;  Laterality: N/A;   CHOLECYSTECTOMY     remote hernia repair     Family History  Problem Relation Age of Onset   Diabetes Mother    Breast cancer Mother    Cervical cancer Mother    Kidney cancer Father        Kidney Cancer   Stroke Father        CVA   Supraventricular tachycardia Sister    Asthma Other        family history   Social History   Socioeconomic History   Marital status: Widowed    Spouse name: Not on file   Number of children: Not on file   Years of education: Not on file   Highest education level: Not on file  Occupational History   Not on file  Tobacco Use   Smoking status: Never   Smokeless tobacco: Never  Vaping Use   Vaping Use: Never used  Substance and Sexual Activity   Alcohol use: No   Drug use: No   Sexual activity: Not on file  Other Topics Concern   Not on file  Social History Narrative   Lives in [place] with her son.   Widowed since 1992.   She is very sedentary, uses a wheelchair when out of the house.   Ambulatory in home with cane.   Social Determinants of Health   Financial Resource Strain: Low Risk  (11/23/2021)   Overall Financial Resource Strain (CARDIA)    Difficulty of Paying Living Expenses: Not hard at all  Food Insecurity: No Food Insecurity (11/23/2021)   Hunger Vital Sign    Worried About Running Out of Food in the Last Year: Never true    Ran Out of Food in the Last Year: Never true  Transportation Needs: No Transportation Needs (11/23/2021)   PRAPARE - Hydrologist (Medical): No    Lack of Transportation (Non-Medical): No  Physical Activity: Inactive (11/23/2021)   Exercise Vital Sign    Days of Exercise per Week:  0 days    Minutes of Exercise per Session: 0 min  Stress: No Stress Concern Present (11/23/2021)   [place]    Feeling of Stress : Not at all  Social Connections: Socially Isolated (11/23/2021)   Social Connection and Isolation Panel [NHANES]    Frequency of Communication with Friends and Family: More than three times a week    Frequency of Social Gatherings with Friends and Family: More than three times a week    Attends Religious Services: Never  Active Member of Clubs or Organizations: No    Attends Archivist Meetings: Not on file    Marital Status: Widowed    Tobacco Counseling Counseling given: Not Answered   Clinical Intake:  Pre-visit preparation completed: Yes  Pain : No/denies pain     Nutritional Status: BMI > 30  Obese Nutritional Risks: None Diabetes: Yes CBG done?: No Did pt. bring in CBG monitor from home?: No  How often do you need to have someone help you when you read instructions, pamphlets, or other written materials from your doctor or pharmacy?: 1 - Never  Diabetic?Nutrition Risk Assessment:  Has the patient had any N/V/D within the last 2 months?  No  Does the patient have any non-healing wounds?  No  Has the patient had any unintentional weight loss or weight gain?  No   Diabetes:  Is the patient diabetic?  Yes  If diabetic, was a CBG obtained today?  No  Did the patient bring in their glucometer from home?  No  How often do you monitor your CBG's? As needed .   Financial Strains and Diabetes Management:  Are you having any financial strains with the device, your supplies or your medication? No .  Does the patient want to be seen by Chronic Care Management for management of their diabetes?  No  Would the patient like to be referred to a Nutritionist or for Diabetic Management?  No   Diabetic Exams:  Diabetic Eye Exam: Overdue for diabetic eye exam. Pt has  been advised about the importance in completing this exam. Patient advised to call and schedule an eye exam. Diabetic Foot Exam: Completed 12/28/20   Interpreter Needed?: No  Information entered by :: Charlott Rakes, LPN   Activities of Daily Living    11/23/2021    1:25 PM  In your present state of health, do you have any difficulty performing the following activities:  Hearing? 0  Vision? 0  Difficulty concentrating or making decisions? 0  Walking or climbing stairs? 1  Comment have a ramp  Dressing or bathing? 0  Doing errands, shopping? 0  Preparing Food and eating ? N  Using the Toilet? N  In the past six months, have you accidently leaked urine? N  Do you have problems with loss of bowel control? N  Managing your Medications? N  Managing your Finances? N  Housekeeping or managing your Housekeeping? N    Patient Care Team: Hoyt Koch, MD as PCP - General (Internal Medicine) Renato Shin, MD (Inactive) as Consulting Physician (Endocrinology) Larey Dresser, MD as Consulting Physician (Cardiology) Lyndal Pulley, DO (Sports Medicine) Laurence Aly, OD as Consulting Physician (Optometry)  Indicate any recent Medical Services you may have received from other than [facility] providers in the past year (date may be approximate).     Assessment:   This is a routine wellness examination for Jhoanna.  Hearing/Vision screen Hearing Screening - Comments:: Pt denies any hearing issues  Vision Screening - Comments:: Pt follows for annual eye exams [place] provider [place]   Dietary issues and exercise activities discussed: Current Exercise Habits: The patient does not participate in regular exercise at present   Goals Addressed             This Visit's Progress    Patient Stated       Maintain health       Depression Screen    11/23/2021    1:24 PM 11/07/2021  2:24 PM 11/10/2020    4:04 PM 07/14/2019   12:57 PM 10/31/2016    3:38 PM 10/08/2015     3:42 PM  PHQ 2/9 Scores  PHQ - 2 Score 0 0 0 0 1 0  PHQ- 9 Score 0 0        Fall Risk    11/23/2021    1:24 PM 11/07/2021    2:24 PM 11/10/2020    4:09 PM 07/14/2019   12:57 PM 10/31/2016    3:38 PM  Fall Risk   Falls in the past year? 0 0 0 0 No  Number falls in past yr: 0 0 0    Injury with Fall? 0 0 0    Risk for fall due to : Impaired vision;Impaired mobility  No Fall Risks    Follow up Falls prevention discussed  Falls evaluation completed      FALL RISK PREVENTION PERTAINING TO THE HOME:  Any stairs in or around the home? Yes  If so, are there any without handrails? No  Home free of loose throw rugs in walkways, pet beds, electrical cords, etc? Yes  Adequate lighting in your home to reduce risk of falls? Yes   ASSISTIVE DEVICES UTILIZED TO PREVENT FALLS:  Life alert? Yes  Use of a cane, walker or w/c? Yes  Grab bars in the bathroom? no Shower chair or bench in shower? Yes  Elevated toilet seat or a handicapped toilet? Yes   TIMED UP AND GO:  Was the test performed? No .   Cognitive Function:        11/23/2021    1:27 PM  6CIT Screen  What Year? 0 points  What month? 0 points  What time? 0 points  Count back from 20 0 points  Months in reverse 0 points  Repeat phrase 0 points  Total Score 0 points    Immunizations Immunization History  Administered Date(s) Administered   Fluad Quad(high Dose 65+) 11/14/2018, 01/22/2020, 12/28/2020, 11/07/2021   Influenza Split 01/09/2011, 11/15/2011   Influenza Whole 09/22/2009, 07/07/2010   Influenza, High Dose Seasonal PF 10/08/2015, 11/15/2017   Influenza,inj,Quad PF,6+ Mos 11/20/2012, 12/24/2013   Influenza-Unspecified 11/07/2014   Pneumococcal Conjugate-13 10/31/2016   Pneumococcal Polysaccharide-23 02/04/2014, 05/21/2015   Tdap 09/21/2015   Zoster, Live 09/08/2014    TDAP status: Up to date  Flu Vaccine status: Up to date  Pneumococcal vaccine status: Up to date  Covid-19 vaccine status: Declined,  Education has been provided regarding the importance of this vaccine but patient still declined. Advised may receive this vaccine at local pharmacy or Health Dept.or vaccine clinic. Aware to provide a copy of the vaccination record if obtained from local pharmacy or Health Dept. Verbalized acceptance and understanding.  Qualifies for Shingles Vaccine? Yes   Zostavax completed No   Shingrix Completed?: No.    Education has been provided regarding the importance of this vaccine. Patient has been advised to call insurance company to determine out of pocket expense if they have not yet received this vaccine. Advised may also receive vaccine at local pharmacy or Health Dept. Verbalized acceptance and understanding.  Screening Tests Health Maintenance  Topic Date Due   COVID-19 Vaccine (1) Never done   OPHTHALMOLOGY EXAM  Never done   Zoster Vaccines- Shingrix (1 of 2) Never done   Diabetic kidney evaluation - Urine ACR  06/18/2021   MAMMOGRAM  11/24/2022 (Originally 09/25/1999)   DEXA SCAN  11/24/2022 (Originally 09/25/2014)   COLONOSCOPY (Pts 45-9yr Insurance coverage  will need to be confirmed)  11/24/2022 (Originally 09/25/1994)   FOOT EXAM  12/28/2021   HEMOGLOBIN A1C  05/09/2022   Diabetic kidney evaluation - GFR measurement  11/08/2022   TETANUS/TDAP  09/20/2025   Pneumonia Vaccine 79+ Years old  Completed   INFLUENZA VACCINE  Completed   Hepatitis C Screening  Completed   HPV VACCINES  Aged Out    Health Maintenance  Health Maintenance Due  Topic Date Due   COVID-19 Vaccine (1) Never done   OPHTHALMOLOGY EXAM  Never done   Zoster Vaccines- Shingrix (1 of 2) Never done   Diabetic kidney evaluation - Urine ACR  06/18/2021    Colorectal cancer screening: No longer required.   Mammogram status: No longer required due to per pt .     Additional Screening:  Hepatitis C Screening:  Completed 08/14/17  Vision Screening: Recommended annual ophthalmology exams for early detection  of glaucoma and other disorders of the eye. Is the patient up to date with their annual eye exam?  Yes  Who is the provider or what is the name of the office in which the patient attends annual eye exams[facility] [place]  If pt is not established with a provider, would they like to be referred to a provider to establish care? No .   Dental Screening: Recommended annual dental exams for proper oral hygiene  Community Resource Referral / Chronic Care Management: CRR required this visit?  No   CCM required this visit?  No      Plan:     I have personally reviewed and noted the following in the patient's chart:   Medical and social history Use of alcohol, tobacco or illicit drugs  Current medications and supplements including opioid prescriptions. Patient is currently taking opioid prescriptions. Information provided to patient regarding non-opioid alternatives. Patient advised to discuss non-opioid treatment plan with their provider. Functional ability and status Nutritional status Physical activity Advanced directives List of other physicians Hospitalizations, surgeries, and ER visits in previous 12 months Vitals Screenings to include cognitive, depression, and falls Referrals and appointments  In addition, I have reviewed and discussed with patient certain preventive protocols, quality metrics, and best practice recommendations. A written personalized care plan for preventive services as well as general preventive health recommendations were provided to patient.     Willette Brace, LPN   [id number]   Nurse Notes: none

## 2021-11-23 NOTE — Patient Instructions (Signed)
Katie Higgins , Thank you for taking time to come for your Medicare Wellness Visit. I appreciate your ongoing commitment to your health goals. Please review the following plan we discussed and let me know if I can assist you in the future.   These are the goals we discussed:  Goals      Client understands the importance of follow-up with providers by attending scheduled visits     Continue to be independent, active and doing my brain exercises everyday.        This is a list of the screening recommended for you and due dates:  Health Maintenance  Topic Date Due   COVID-19 Vaccine (1) Never done   Eye exam for diabetics  Never done   Zoster (Shingles) Vaccine (1 of 2) Never done   Yearly kidney health urinalysis for diabetes  06/18/2021   Mammogram  11/24/2022*   DEXA scan (bone density measurement)  11/24/2022*   Colon Cancer Screening  11/24/2022*   Complete foot exam   12/28/2021   Hemoglobin A1C  05/09/2022   Yearly kidney function blood test for diabetes  11/08/2022   Tetanus Vaccine  09/20/2025   Pneumonia Vaccine  Completed   Flu Shot  Completed   Hepatitis C Screening: USPSTF Recommendation to screen - Ages 18-79 yo.  Completed   HPV Vaccine  Aged Out  *Topic was postponed. The date shown is not the original due date.    Advanced directives: Please bring a copy of your health care power of attorney and living will to the office at your convenience.   Conditions/risks identified: maintain health   Next appointment: Follow up in one year for your annual wellness visit    Preventive Care 65 Years and Older, Female Preventive care refers to lifestyle choices and visits with your health care provider that can promote health and wellness. What does preventive care include? A yearly physical exam. This is also called an annual well check. Dental exams once or twice a year. Routine eye exams. Ask your health care provider how often you should have your eyes  checked. Personal lifestyle choices, including: Daily care of your teeth and gums. Regular physical activity. Eating a healthy diet. Avoiding tobacco and drug use. Limiting alcohol use. Practicing safe sex. Taking low-dose aspirin every day. Taking vitamin and mineral supplements as recommended by your health care provider. What happens during an annual well check? The services and screenings done by your health care provider during your annual well check will depend on your age, overall health, lifestyle risk factors, and family history of disease. Counseling  Your health care provider may ask you questions about your: Alcohol use. Tobacco use. Drug use. Emotional well-being. Home and relationship well-being. Sexual activity. Eating habits. History of falls. Memory and ability to understand (cognition). Work and work Statistician. Reproductive health. Screening  You may have the following tests or measurements: Height, weight, and BMI. Blood pressure. Lipid and cholesterol levels. These may be checked every 5 years, or more frequently if you are over 9 years old. Skin check. Lung cancer screening. You may have this screening every year starting at age 25 if you have a 30-pack-year history of smoking and currently smoke or have quit within the past 15 years. Fecal occult blood test (FOBT) of the stool. You may have this test every year starting at age 2. Flexible sigmoidoscopy or colonoscopy. You may have a sigmoidoscopy every 5 years or a colonoscopy every 10 years starting at age  50. Hepatitis C blood test. Hepatitis B blood test. Sexually transmitted disease (STD) testing. Diabetes screening. This is done by checking your blood sugar (glucose) after you have not eaten for a while (fasting). You may have this done every 1-3 years. Bone density scan. This is done to screen for osteoporosis. You may have this done starting at age 27. Mammogram. This may be done every 1-2  years. Talk to your health care provider about how often you should have regular mammograms. Talk with your health care provider about your test results, treatment options, and if necessary, the need for more tests. Vaccines  Your health care provider may recommend certain vaccines, such as: Influenza vaccine. This is recommended every year. Tetanus, diphtheria, and acellular pertussis (Tdap, Td) vaccine. You may need a Td booster every 10 years. Zoster vaccine. You may need this after age 26. Pneumococcal 13-valent conjugate (PCV13) vaccine. One dose is recommended after age 33. Pneumococcal polysaccharide (PPSV23) vaccine. One dose is recommended after age 23. Talk to your health care provider about which screenings and vaccines you need and how often you need them. This information is not intended to replace advice given to you by your health care provider. Make sure you discuss any questions you have with your health care provider. Document Released: 02/19/2015 Document Revised: 10/13/2015 Document Reviewed: 11/24/2014 Elsevier Interactive Patient Education  2017 [employer] Prevention in the Home Falls can cause injuries. They can happen to people of all ages. There are many things you can do to make your home safe and to help prevent falls. What can I do on the outside of my home? Regularly fix the edges of walkways and driveways and fix any cracks. Remove anything that might make you trip as you walk through a door, such as a raised step or threshold. Trim any bushes or trees on the path to your home. Use bright outdoor lighting. Clear any walking paths of anything that might make someone trip, such as rocks or tools. Regularly check to see if handrails are loose or broken. Make sure that both sides of any steps have handrails. Any raised decks and porches should have guardrails on the edges. Have any leaves, snow, or ice cleared regularly. Use sand or salt on walking paths  during winter. Clean up any spills in your garage right away. This includes oil or grease spills. What can I do in the bathroom? Use night lights. Install grab bars by the toilet and in the tub and shower. Do not use towel bars as grab bars. Use non-skid mats or decals in the tub or shower. If you need to sit down in the shower, use a plastic, non-slip stool. Keep the floor dry. Clean up any water that spills on the floor as soon as it happens. Remove soap buildup in the tub or shower regularly. Attach bath mats securely with double-sided non-slip rug tape. Do not have throw rugs and other things on the floor that can make you trip. What can I do in the bedroom? Use night lights. Make sure that you have a light by your bed that is easy to reach. Do not use any sheets or blankets that are too big for your bed. They should not hang down onto the floor. Have a firm chair that has side arms. You can use this for support while you get dressed. Do not have throw rugs and other things on the floor that can make you trip. What can I do  in the kitchen? Clean up any spills right away. Avoid walking on wet floors. Keep items that you use a lot in easy-to-reach places. If you need to reach something above you, use a strong step stool that has a grab bar. Keep electrical cords out of the way. Do not use floor polish or wax that makes floors slippery. If you must use wax, use non-skid floor wax. Do not have throw rugs and other things on the floor that can make you trip. What can I do with my stairs? Do not leave any items on the stairs. Make sure that there are handrails on both sides of the stairs and use them. Fix handrails that are broken or loose. Make sure that handrails are as long as the stairways. Check any carpeting to make sure that it is firmly attached to the stairs. Fix any carpet that is loose or worn. Avoid having throw rugs at the top or bottom of the stairs. If you do have throw  rugs, attach them to the floor with carpet tape. Make sure that you have a light switch at the top of the stairs and the bottom of the stairs. If you do not have them, ask someone to add them for you. What else can I do to help prevent falls? Wear shoes that: Do not have high heels. Have rubber bottoms. Are comfortable and fit you well. Are closed at the toe. Do not wear sandals. If you use a stepladder: Make sure that it is fully opened. Do not climb a closed stepladder. Make sure that both sides of the stepladder are locked into place. Ask someone to hold it for you, if possible. Clearly mark and make sure that you can see: Any grab bars or handrails. First and last steps. Where the edge of each step is. Use tools that help you move around (mobility aids) if they are needed. These include: Canes. Walkers. Scooters. Crutches. Turn on the lights when you go into a dark area. Replace any light bulbs as soon as they burn out. Set up your furniture so you have a clear path. Avoid moving your furniture around. If any of your floors are uneven, fix them. If there are any pets around you, be aware of where they are. Review your medicines with your doctor. Some medicines can make you feel dizzy. This can increase your chance of falling. Ask your doctor what other things that you can do to help prevent falls. This information is not intended to replace advice given to you by your health care provider. Make sure you discuss any questions you have with your health care provider. Document Released: 11/19/2008 Document Revised: 07/01/2015 Document Reviewed: 02/27/2014 Elsevier Interactive Patient Education  2017 [employer].

## 2021-12-05 ENCOUNTER — Telehealth: Payer: Self-pay | Admitting: Physician Assistant

## 2021-12-05 DIAGNOSIS — I493 Ventricular premature depolarization: Secondary | ICD-10-CM

## 2021-12-05 DIAGNOSIS — I5032 Chronic diastolic (congestive) heart failure: Secondary | ICD-10-CM

## 2021-12-05 DIAGNOSIS — R0602 Shortness of breath: Secondary | ICD-10-CM

## 2021-12-05 MED ORDER — ENALAPRIL MALEATE 10 MG PO TABS: 10.0000 mg | ORAL_TABLET | Freq: Every evening | ORAL | 1 refills | Status: DC

## 2021-12-05 MED ORDER — LOVASTATIN 10 MG PO TABS: 10.0000 mg | ORAL_TABLET | Freq: Every day | ORAL | 1 refills | Status: DC

## 2021-12-05 MED ORDER — CARVEDILOL 25 MG PO TABS: 25.0000 mg | ORAL_TABLET | Freq: Two times a day (BID) | ORAL | 1 refills | Status: DC

## 2021-12-05 NOTE — Telephone Encounter (Signed)
Pt's medications were sent to pt's pharmacy as requested. Confirmation received.  

## 2021-12-05 NOTE — Telephone Encounter (Signed)
*  STAT* If patient is at the pharmacy, call can be transferred to refill team.   1. Which medications need to be refilled? (please list name of each medication and dose if known)  lovastatin (MEVACOR) 10 MG tablet enalapril (VASOTEC) 10 MG tablet carvedilol (COREG) 25 MG tablet  2. Which pharmacy/location (including street and city if local pharmacy) is medication to be sent to? [facility] - [place], [address]  3. Do they need a 30 day or 90 day supply?   30 day supply Patient has a 2 week supply remaining and is scheduled for 12/12 with Tommye Standard, PA.

## 2021-12-09 ENCOUNTER — Other Ambulatory Visit: Payer: Self-pay

## 2021-12-09 DIAGNOSIS — E041 Nontoxic single thyroid nodule: Secondary | ICD-10-CM

## 2021-12-09 MED ORDER — METHIMAZOLE 5 MG PO TABS: ORAL_TABLET | ORAL | 0 refills | Status: DC

## 2021-12-13 ENCOUNTER — Ambulatory Visit: Payer: Medicare Other | Admitting: Internal Medicine

## 2021-12-16 ENCOUNTER — Encounter: Payer: Self-pay | Admitting: Internal Medicine

## 2021-12-19 ENCOUNTER — Other Ambulatory Visit: Payer: Self-pay

## 2021-12-19 DIAGNOSIS — Z760 Encounter for issue of repeat prescription: Secondary | ICD-10-CM

## 2021-12-19 MED ORDER — DICLOFENAC SODIUM 75 MG PO TBEC: 75.0000 mg | DELAYED_RELEASE_TABLET | Freq: Two times a day (BID) | ORAL | 3 refills | Status: DC

## 2022-01-15 NOTE — Progress Notes (Unsigned)
Cardiology Office Note Date:  01/15/2022  Patient ID:  Katie Higgins, Katie Higgins 06-08-1950, MRN [id number] PCP:  Hoyt Koch, MD  Cardiologist:  Dr. [name] (2017) EP: Dr. [name] Endo: Dr. [name]    Chief Complaint:   65movisit  History of Present Illness: Katie SALSBURYis a 71y.o. female with history of hyperthyroidism, PVCs, DM, HLD, HTN, CKD (III), morbid obesity. 2011 w/u noted CM > LHC w/normal coronaries.   CM suspect 2/2 hyperthyroidism, and did improve with therapy.    She was referred to Dr. ARayann Hemanwho felt with minimial symptoms from PVCs and imporvement of her EF, pt preference to avoid invasive procedure, no plans for EP/ablation  She saw [name] NP 2018, described as essentially wheelchair bound, no symptoms of PVCs, planned to update her echo, if remained preserved LVEF to continue medical therapy. LVEF was 55-60% She has seen Dr. ARayann Hemanannually, most recently via tele health June 2020, doing well, no changes were made, planned for annual APP visit.  I saw her 07/14/2019 She is accompanied by her son today.  She is in a wheelchair, but reports that she is able to ambulates some.  She has bad OA of her hips and knees that is why she uses a wheelchair.  She says she can stand long enough to cook, wash up, able to do her ADLs independently.  She denies any palpitations or awareness of any PVCs if she is having any.  She will rarely get a fleeting/momentary "pang" in the center of her chest.  maybe the last was 6 mo or so ago.  No rest SOB, no DOE at her level of exertinal.  She denies symptoms of orthopnea or PND.   No dizzy spells, near syncope or syncope.  She has noticed swelling in her feet for the last 3 weeks, it is nearly if not completely resolved in the AM, but then some days uncomfortably swollen.  She notes this is associated with an aching discomfort to the very lateral edges of her feet, like she has been walking on the edge of her feet, or has a "rock  bruise"  Edema sounded more of a dependent edema, Recommend she take her lasix QD for 3 days then resume 3d/week, discussed minimizing sodium and elevating feet when seated.  If her lasix requirements remain increased, will update her echo, she is alsked to let uKoreaknow. No PVCs on her EKG.  01/12/20 with me She is accompanied by her son again today She lives in her own home, essentially wheelchair bound for ambulating 2/2 her OA and terrible knee pain, but once in the kitchen stands to cook, do dishes, etc  Able to care for herself. She denies any CP, palpitations or SOB, no cardiac awareness. She denies any symptoms of PND or orthopnea. Tolerating her medicines well No recurrent edema that she noticed Exam was without extrasystoles, no symptoms or exam findings to suggest volume OL or cardiac changes.  Planned for 664moisit > 1 year if she remained clinically stable cardiac-wise.  I saw her 11/24/20 Again, is accompanied by her son. She is doing well.  Had COVID early this year though did OK. 79m26moo she had a single episode of a fleeting/momentary flutter in her heart beat.  None otherwise with no cardiac awareness She will have a sense of chest heaviness/pulling when not wearing a bra particularly L breast/sided, that is resolved with wearing a bra and knows this is not her  heart. No enar syncope or syncope No edema No changes in her baseline capacity to take care of her self, does self care well, able to get into and out of the chair by her self, just can not ambulate any real distance 2/2 severe OA of her hips/knees Her endocrinologist released her to follow thyroid with her PMD, she also manages her lipids.  Felt to be doing well, no symptoms or exam findings to suggest any clinical changes, not felt to need a new echo.  Planned for an annual visit  TODAY She is accompanied by her son. She is doing well, very limited physically with er arthritis unfortunately. No cardiac concerns,  no CP, palpitations or cardiac awareness No SOB No near syncope or syncope. When first laying down in bed feels a little dizzy for some reason.  She mentions that when she is getting to the time to take her next metoprolol she feels little off, a bit like a slump in energy, she assumes that her medicine is wearing off and will take it early.  Her home BP is 120's/60's  Past Medical History:  Diagnosis Date   ALLERGIC RHINITIS    CARDIOMYOPATHY    Nonischemic. Admitted in 3/11 with CHF exac, due to hyperthyroidism. LHC (03/11) showed clean coronaries with EF 40%. Myoview showed EF 38%. Echo was a technically difficult study with mild to moderately decreased EF, mild LVH, mild MR. Repeat echo (9/11) with EF 33-58%, mild diastolic dysfunction (grade I).   CARPAL TUNNEL SYNDROME, LEFT, MILD    CKD (chronic kidney disease), stage III (HCC)    Congestive heart failure, unspecified    DIABETES MELLITUS, CONTROLLED    DYSLIPIDEMIA    Graves disease    HYPERTENSION    HYPERTHYROIDISM    Obesity    Osteoarthritis    esp L hip, low back   Palpitations    Premature ventricular contraction     Past Surgical History:  Procedure Laterality Date   CARDIAC CATHETERIZATION  04/19/09   CARDIAC CATHETERIZATION N/A 09/30/2015   Procedure: Left Heart Cath and Coronary Angiography;  Surgeon: Larey Dresser, MD;  Location: [facility];  Service: Cardiovascular;  Laterality: N/A;   CHOLECYSTECTOMY     remote hernia repair      Current Outpatient Medications  Medication Sig Dispense Refill   acetaminophen (TYLENOL) 500 MG tablet Take 500 mg by mouth every 6 (six) hours as needed for moderate pain.     aspirin 81 MG tablet Take 81 mg by mouth daily.     blood glucose meter kit and supplies Dispense based on patient and insurance preference. Use up to four times daily as directed. (FOR ICD-10 E10.9, E11.9). 1 each 0   carvedilol (COREG) 25 MG tablet Take 1 tablet (25 mg total) by mouth 2 (two)  times daily. 60 tablet 1   CHLORPHENIRAMINE MALEATE PO Take by mouth.     colchicine 0.6 MG tablet Take 0.6 mg by mouth daily as needed. As directed     diclofenac (VOLTAREN) 75 MG EC tablet Take 1 tablet (75 mg total) by mouth 2 (two) times daily. 180 tablet 3   enalapril (VASOTEC) 10 MG tablet Take 1 tablet (10 mg total) by mouth every evening. 30 tablet 1   furosemide (LASIX) 20 MG tablet Take 1 tablet (20 mg total) by mouth 3 (three) times a week. 8 tablet 0   glucose blood (FREESTYLE TEST STRIPS) test strip Use to check blood sugars twice a  day 300 each 1   lovastatin (MEVACOR) 10 MG tablet Take 1 tablet (10 mg total) by mouth at bedtime. 30 tablet 1   methimazole (TAPAZOLE) 5 MG tablet TAKE ONE TABLET BY MOUTH FOUR TIMES A WEEK 48 tablet 0   Multiple Vitamin (MULTIVITAMIN) tablet Take 1 tablet by mouth daily.     ondansetron (ZOFRAN) 4 MG tablet Take 1 tablet (4 mg total) by mouth every 8 (eight) hours as needed for nausea or vomiting. 20 tablet 0   traMADol (ULTRAM) 50 MG tablet Take 1-2 tablets (50-100 mg total) by mouth daily as needed (pain). 60 tablet 5   No current facility-administered medications for this visit.    Allergies:   Latex, Wool alcohol [lanolin], and Amitriptyline   Social History:  The patient  reports that she has never smoked. She has never used smokeless tobacco. She reports that she does not drink alcohol and does not use drugs.   Family History:  The patient's family history includes Asthma in an other family member; Breast cancer in her mother; Cervical cancer in her mother; Diabetes in her mother; Kidney cancer in her father; Stroke in her father; Supraventricular tachycardia in her sister.  ROS:  Please see the history of present illness.  All other systems are reviewed and otherwise negative.   PHYSICAL EXAM:  VS:  There were no vitals taken for this visit. BMI: There is no height or weight on file to calculate BMI. Well nourished, well developed, in no  acute distress  HEENT: normocephalic, atraumatic  Neck: no JVD, carotid bruits or masses Cardiac:  RRR; no significant murmurs, no rubs, or gallops,  no extrasystoles noted with prolonged auscultation Lungs:   CTA b/l, no wheezing, rhonchi or rales  Abd: soft, nontender,  she has a very large pannus MS: no deformity or atrophy Ext:  no edema b/l Skin: warm and dry, no rash Neuro:  No gross deficits appreciated Psych: euthymic mood, full affect   EKG: done today and reviewed by myself SR 68bpm , normal intervals, no PVCs  03/17/20 EKGs previously reviewed:  SR 68bpm, no PVCs SR 79bpm, 2 PVCs  05/11/2016: TTE Study Conclusions  - Left ventricle: The cavity size was normal. Systolic function was    normal. The estimated ejection fraction was in the range of 55%    to 60%. Wall motion was normal; there were no regional wall    motion abnormalities. Doppler parameters are consistent with    abnormal left ventricular relaxation (grade 1 diastolic    dysfunction). Doppler parameters are consistent with elevated    ventricular end-diastolic filling pressure. Mean left atrial    pressure appears to be normal at rest.    09/30/2015: LHC No obstructive coronary disease.  Small caliber distal LAD but similar to prior study.    Recent Labs: 11/07/2021: ALT 8; BUN 26; Creatinine, Ser 1.39; Hemoglobin 11.3; Platelets 261.0; Potassium 5.2 No hemolysis seen; Sodium 139; TSH 2.54  11/07/2021: Cholesterol 109; HDL 46.20; LDL Cholesterol 47; Total CHOL/HDL Ratio 2; Triglycerides 76.0; VLDL 15.2   CrCl cannot be calculated (Patient's most recent lab result is older than the maximum 21 days allowed.).   Wt Readings from Last 3 Encounters:  05/12/21 (!) 306 lb (138.8 kg)  11/24/20 289 lb (131.1 kg)  06/18/20 289 lb (131.1 kg)     Other studies reviewed: Additional studies/records reviewed today include: summarized above  ASSESSMENT AND PLAN:  1. PVCs     No symptoms to suggest any  significant  burden.     None noted again today with prolonged auscultation  2. NICM     Recovered LVEF, suspect 2/2 hyperthyroidism       3. HTN     Looks good, no changes I have asked her to check her BP/HR when she feels that sense of a slump in energy and let me know what her readings are   Given time since a MD visit and need for a new EP MD, discussed transitioning to Dr. Curt Bears.  She is agreeable. I reviewed her case with him, stable EKG, lack of symptoms, agrees, no changes to management needed.     Disposition: Will have her see Dr. Curt Bears in a year sooner if needed   Current medicines are reviewed at length with the patient today.  The patient did not have any concerns regarding medicines  Signed, Tommye Standard, PA-C 01/15/2022 2:41 PM     [address] [place] [address] [phone] (office)  [phone] (fax)

## 2022-01-17 ENCOUNTER — Encounter: Payer: Self-pay | Admitting: Physician Assistant

## 2022-01-17 ENCOUNTER — Ambulatory Visit: Payer: Medicare Other | Attending: Physician Assistant | Admitting: Physician Assistant

## 2022-01-17 VITALS — BP 140/90 | HR 68 | Ht 67.0 in | Wt 301.0 lb

## 2022-01-17 DIAGNOSIS — I428 Other cardiomyopathies: Secondary | ICD-10-CM

## 2022-01-17 DIAGNOSIS — I493 Ventricular premature depolarization: Secondary | ICD-10-CM

## 2022-01-17 DIAGNOSIS — I10 Essential (primary) hypertension: Secondary | ICD-10-CM | POA: Insufficient documentation

## 2022-01-17 NOTE — Patient Instructions (Signed)
Medication Instructions:   Your physician recommends that you continue on your current medications as directed. Please refer to the Current Medication list given to you today.  *If you need a refill on your cardiac medications before your next appointment, please call your pharmacy*   Lab Work:   [place]   If you have labs (blood work) drawn today and your tests are completely normal, you will receive your results only by: [place] (if you have MyChart) OR A paper copy in the mail If you have any lab test that is abnormal or we need to change your treatment, we will call you to review the results.   Testing/Procedures: NONE ORDERED  TODAY     Follow-Up: At [facility], you and your health needs are our priority.  As part of our continuing mission to provide you with exceptional heart care, we have created designated Provider Care Teams.  These Care Teams include your primary Cardiologist (physician) and Advanced Practice Providers (APPs -  Physician Assistants and Nurse Practitioners) who all work together to provide you with the care you need, when you need it.  We recommend signing up for the patient portal called "MyChart".  Sign up information is provided on this After Visit Summary.  MyChart is used to connect with patients for Virtual Visits (Telemedicine).  Patients are able to view lab/test results, encounter notes, upcoming appointments, etc.  Non-urgent messages can be sent to your provider as well.   To learn more about what you can do with MyChart, go to [email or web address].    Your next appointment:   1 year(s)  The format for your next appointment:   In Person  Provider:   Allegra Lai, MD    Other Instructions   Important Information About Sugar

## 2022-01-21 DIAGNOSIS — I5032 Chronic diastolic (congestive) heart failure: Secondary | ICD-10-CM

## 2022-01-21 DIAGNOSIS — R0602 Shortness of breath: Secondary | ICD-10-CM

## 2022-01-21 DIAGNOSIS — I493 Ventricular premature depolarization: Secondary | ICD-10-CM

## 2022-01-23 MED ORDER — ENALAPRIL MALEATE 10 MG PO TABS: 10.0000 mg | ORAL_TABLET | Freq: Every evening | ORAL | 3 refills | Status: DC

## 2022-01-23 MED ORDER — CARVEDILOL 25 MG PO TABS: 25.0000 mg | ORAL_TABLET | Freq: Two times a day (BID) | ORAL | 3 refills | Status: DC

## 2022-01-23 MED ORDER — LOVASTATIN 10 MG PO TABS: 10.0000 mg | ORAL_TABLET | Freq: Every day | ORAL | 3 refills | Status: DC

## 2022-02-07 ENCOUNTER — Ambulatory Visit: Payer: Medicare Other | Admitting: Internal Medicine

## 2022-04-10 ENCOUNTER — Telehealth: Payer: Self-pay | Admitting: Internal Medicine

## 2022-04-10 DIAGNOSIS — E041 Nontoxic single thyroid nodule: Secondary | ICD-10-CM

## 2022-04-10 MED ORDER — METHIMAZOLE 5 MG PO TABS: ORAL_TABLET | ORAL | 0 refills | Status: DC

## 2022-04-10 NOTE — Telephone Encounter (Signed)
Rx sent 

## 2022-04-10 NOTE — Telephone Encounter (Signed)
MEDICATION: methimazole methimazole (TAPAZOLE) 5 MG tablet  PHARMACY:    [facility] - [place], [address] (Ph: [phone])    HAS THE PATIENT CONTACTED THEIR PHARMACY?  Yes  IS THIS A 90 DAY SUPPLY : Yes  IS PATIENT OUT OF MEDICATION: No  IF NOT; HOW MUCH IS LEFT: 2 weeks  LAST APPOINTMENT DATE: '@4'$ /07/2021  NEXT APPOINTMENT DATE:'@4'$ /10/2022  DO WE HAVE YOUR PERMISSION TO LEAVE A DETAILED MESSAGE?: Yes  OTHER COMMENTS:    **Let patient know to contact pharmacy at the end of the day to make sure medication is ready. **  ** Please notify patient to allow 48-72 hours to process**  **Encourage patient to contact the pharmacy for refills or they can request refills through [facility]**

## 2022-04-20 ENCOUNTER — Encounter: Payer: Self-pay | Admitting: Internal Medicine

## 2022-04-20 ENCOUNTER — Other Ambulatory Visit: Payer: Self-pay

## 2022-04-20 DIAGNOSIS — M1712 Unilateral primary osteoarthritis, left knee: Secondary | ICD-10-CM

## 2022-04-20 DIAGNOSIS — M1711 Unilateral primary osteoarthritis, right knee: Secondary | ICD-10-CM

## 2022-04-20 MED ORDER — ONDANSETRON HCL 4 MG PO TABS: 4.0000 mg | ORAL_TABLET | Freq: Three times a day (TID) | ORAL | 0 refills | Status: DC | PRN

## 2022-04-25 MED ORDER — TRAMADOL HCL 50 MG PO TABS: 50.0000 mg | ORAL_TABLET | Freq: Every day | ORAL | 5 refills | Status: DC | PRN

## 2022-05-16 ENCOUNTER — Telehealth: Payer: Self-pay | Admitting: Internal Medicine

## 2022-05-16 ENCOUNTER — Ambulatory Visit: Payer: Medicare Other | Admitting: Internal Medicine

## 2022-05-16 ENCOUNTER — Encounter: Payer: Self-pay | Admitting: Internal Medicine

## 2022-05-16 DIAGNOSIS — E041 Nontoxic single thyroid nodule: Secondary | ICD-10-CM

## 2022-05-16 MED ORDER — METHIMAZOLE 5 MG PO TABS: ORAL_TABLET | ORAL | 0 refills | Status: DC

## 2022-05-16 NOTE — Telephone Encounter (Signed)
MEDICATION: methimazole methimazole (TAPAZOLE) 5 MG tablet  PHARMACY:    [facility] - [place], [place] - [id number] (Ph: [phone])    HAS THE PATIENT CONTACTED THEIR PHARMACY?  Yes  IS THIS A 90 DAY SUPPLY : Yes  IS PATIENT OUT OF MEDICATION: Not sure  IF NOT; HOW MUCH IS LEFT:   LAST APPOINTMENT DATE: @4 /07/2021  NEXT APPOINTMENT DATE:@5 /14/2024  DO WE HAVE YOUR PERMISSION TO LEAVE A DETAILED MESSAGE?: Yes  OTHER COMMENTS: Patient had an appointment for today, but rescheduled saying she has a stomach virus-diarrhea & fever   **Let patient know to contact pharmacy at the end of the day to make sure medication is ready. **  ** Please notify patient to allow 48-72 hours to process**  **Encourage patient to contact the pharmacy for refills or they can request refills through [facility]**

## 2022-05-16 NOTE — Telephone Encounter (Signed)
Rx sent 

## 2022-06-19 DIAGNOSIS — E041 Nontoxic single thyroid nodule: Secondary | ICD-10-CM

## 2022-06-19 MED ORDER — METHIMAZOLE 5 MG PO TABS: ORAL_TABLET | ORAL | 0 refills | Status: DC

## 2022-06-19 NOTE — Telephone Encounter (Signed)
Patient rescheduled appointment due to personal conflict and needs more refills until 10/13/2022 appointment.

## 2022-06-20 ENCOUNTER — Ambulatory Visit: Payer: Medicare Other | Admitting: Internal Medicine

## 2022-09-29 ENCOUNTER — Other Ambulatory Visit: Payer: Self-pay

## 2022-09-29 ENCOUNTER — Encounter: Payer: Self-pay | Admitting: Internal Medicine

## 2022-09-29 DIAGNOSIS — Z760 Encounter for issue of repeat prescription: Secondary | ICD-10-CM

## 2022-09-29 MED ORDER — DICLOFENAC SODIUM 75 MG PO TBEC: 75.0000 mg | DELAYED_RELEASE_TABLET | Freq: Two times a day (BID) | ORAL | 3 refills | Status: DC

## 2022-09-29 MED ORDER — DICLOFENAC SODIUM 75 MG PO TBEC
75.0000 mg | DELAYED_RELEASE_TABLET | Freq: Two times a day (BID) | ORAL | 0 refills | Status: DC
Start: 2022-09-29 — End: 2022-10-19

## 2022-09-29 NOTE — Telephone Encounter (Signed)
Diclofenac refilled.

## 2022-10-12 ENCOUNTER — Ambulatory Visit (INDEPENDENT_AMBULATORY_CARE_PROVIDER_SITE_OTHER): Payer: Medicare Other | Admitting: Internal Medicine

## 2022-10-12 ENCOUNTER — Encounter: Payer: Self-pay | Admitting: Internal Medicine

## 2022-10-12 VITALS — BP 138/70 | HR 77

## 2022-10-12 DIAGNOSIS — E0580 Other thyrotoxicosis without thyrotoxic crisis or storm: Secondary | ICD-10-CM

## 2022-10-12 DIAGNOSIS — E041 Nontoxic single thyroid nodule: Secondary | ICD-10-CM | POA: Diagnosis not present

## 2022-10-12 NOTE — Progress Notes (Signed)
Patient ID: Katie Higgins, female   DOB: 1950-06-30, 72 y.o.   MRN: [id number]   HPI  Katie Higgins is a 72 y.o.-year-old female, returning for follow-up for thyrotoxicosis, with suspicion of Graves' disease.  She previously saw Dr. Everardo All, last visit with him 1 year and 5 months ago.  She is here with her son who helps her with the wheelchair.  Patient has a history of thyrotoxicosis, diagnosed in 2011.  It was assumed that this was related to Graves disease.  However, I do not have TSI antibodies for review.  At that time, she had a thyroid ultrasound that showed a small subcentimeter thyroid nodule.  She was started on methimazole and the dose was decreased to 5 mg 4 times a week, which she is continuing now.  Per reviewing Dr. George Hugh notes, patient was not able to safely stop methimazole for RAI treatment.  At today's visit, she describes tremors, but no weight loss, palpitations, diarrhea/constipation, anxiety, heat intolerance.  She feels well and has good energy.  I reviewed pt's thyroid tests: Lab Results  Component Value Date   TSH 2.54 11/07/2021   TSH 2.29 05/12/2021   TSH 1.55 06/18/2020   TSH 2.43 01/22/2020   TSH 2.05 07/14/2019   TSH 2.31 05/15/2019   TSH 2.11 11/14/2018   TSH 2.12 08/13/2018   TSH 2.02 11/15/2017   TSH 2.24 05/09/2017   TSH 2.72 01/24/2017   TSH 2.25 11/08/2016   TSH 4.92 (H) 07/31/2016   TSH 3.91 01/20/2016   TSH 2.52 07/14/2015   TSH 3.13 07/01/2014   TSH 5.14 (H) 01/14/2014   TSH 3.87 03/04/2013   TSH 3.12 08/20/2012   TSH 2.71 05/15/2012   TSH 2.04 02/20/2012   TSH 4.70 11/15/2011   TSH 2.92 07/31/2011   TSH 4.63 01/09/2011   TSH 3.06 09/14/2010   TSH 2.58 07/07/2010   TSH 1.66 04/05/2010   TSH 8.84 (H) 01/04/2010   TSH 0.86 10/06/2009   TSH 0.03 06/10/2009   Lab Results  Component Value Date   FREET4 1.03 11/07/2021   FREET4 1.16 05/12/2021   FREET4 0.96 06/18/2020   FREET4 0.85 01/22/2020   FREET4 1.33 07/14/2019    FREET4 1.00 05/15/2019   FREET4 1.02 11/14/2018   FREET4 1.04 08/13/2018   FREET4 0.99 11/15/2017   FREET4 1.16 05/09/2017   FREET4 0.96 01/24/2017   FREET4 1.03 11/08/2016   FREET4 0.78 07/31/2016   FREET4 0.89 01/20/2016   FREET4 0.86 07/14/2015   FREET4 1.06 07/01/2014   FREET4 0.88 01/14/2014   FREET4 0.84 04/05/2010   FREET4 3.21 (H) 04/13/2009   T3FREE 8.9 (H) 04/13/2009   Antithyroid antibodies: No results found for: "TSI"  Thyroid ultrasound (05/31/2009): The thyroid gland is within upper limits of normal.  The  right lobe measures 4.9 cm sagittally, with a depth of 2.0 cm and  width of 2.0 cm.  The left lobe measures 5.0 x 2.0 x 1.9 cm, with  the isthmus measuring 10 mm in thickness.  The gland is diffusely  inhomogeneous.  Only a single inhomogeneous nodule is noted in the  lower pole of the right lobe measuring 6 x 5 x 7 mm which may  contain a few calcifications.    IMPRESSION:  The thyroid gland is within upper limits of normal although  inhomogeneous in echogenicity, with only a single solid nodule in  the lower pole on the right of no more than 7 mm in diameter.   Thyroid ultrasound (  08/21/2012): Right thyroid lobe:  50 x 17 x 22 mm, mildly inhomogeneous background echotexture  Left thyroid lobe:  53 x 17 x 18 mm  Isthmus:  10 mm in thickness  Focal nodules:  5 mm hypoechoic solid, superior right 5 x 9 x 9 mm hypoechoic solid, inferior right  Lymphadenopathy:  None visualized.   IMPRESSION:  1. Normal sized thyroid with two small right nodules which do not  meet current consensus criteria for biopsy.  Follow-up by clinical  exam is recommended.  If patient has known risk factors for thyroid  carcinoma, consider follow-up ultrasound in 12 months.  If patient  is clinically hyperthyroid, consider nuclear medicine thyroid  uptake and scan.   Pt denies: - feeling nodules in neck - hoarseness - dysphagia - choking  She denies: - fatigue - excessive  sweating/heat intolerance - tremors - anxiety - palpitations - hyperdefecation - weight loss - hair loss  Pt does have a FH of thyroid ds. In PGM: thyroid nodule. No FH of thyroid cancer. No h/o radiation tx to head or neck.  No seaweed or kelp, no recent contrast studies. No steroid use. No herbal supplements. No Biotin use.  Pt. also has a history of HTN, HL, CMP, CHF, CKD, prediabetes, GERD, small esophagus.  ROS: Constitutional: + see HPI  Past Medical History:  Diagnosis Date   ALLERGIC RHINITIS    CARDIOMYOPATHY    Nonischemic. Admitted in 3/11 with CHF exac, due to hyperthyroidism. LHC (03/11) showed clean coronaries with EF 40%. Myoview showed EF 38%. Echo was a technically difficult study with mild to moderately decreased EF, mild LVH, mild MR. Repeat echo (9/11) with EF 55-60%, mild diastolic dysfunction (grade I).   CARPAL TUNNEL SYNDROME, LEFT, MILD    CKD (chronic kidney disease), stage III (HCC)    Congestive heart failure, unspecified    DIABETES MELLITUS, CONTROLLED    DYSLIPIDEMIA    Graves disease    HYPERTENSION    HYPERTHYROIDISM    Obesity    Osteoarthritis    esp L hip, low back   Palpitations    Premature ventricular contraction    Past Surgical History:  Procedure Laterality Date   CARDIAC CATHETERIZATION  04/19/09   CARDIAC CATHETERIZATION N/A 09/30/2015   Procedure: Left Heart Cath and Coronary Angiography;  Surgeon: Laurey Morale, MD;  Location: [facility] INVASIVE CV LAB;  Service: Cardiovascular;  Laterality: N/A;   CHOLECYSTECTOMY     remote hernia repair     Social History   Socioeconomic History   Marital status: Widowed    Spouse name: Not on file   Number of children: Not on file   Years of education: Not on file   Highest education level: Not on file  Occupational History   Not on file  Tobacco Use   Smoking status: Never   Smokeless tobacco: Never  Vaping Use   Vaping status: Never Used  Substance and Sexual Activity   Alcohol  use: No   Drug use: No   Sexual activity: Not on file  Other Topics Concern   Not on file  Social History Narrative   Lives in [place] with her son.   Widowed since 1992.   She is very sedentary, uses a wheelchair when out of the house.   Ambulatory in home with cane.   Social Determinants of Health   Financial Resource Strain: Low Risk  (11/23/2021)   Overall Financial Resource Strain (CARDIA)    Difficulty of Paying Living  Expenses: Not hard at all  Food Insecurity: No Food Insecurity (11/23/2021)   Hunger Vital Sign    Worried About Running Out of Food in the Last Year: Never true    Ran Out of Food in the Last Year: Never true  Transportation Needs: No Transportation Needs (11/23/2021)   PRAPARE - Administrator, Civil Service (Medical): No    Lack of Transportation (Non-Medical): No  Physical Activity: Inactive (11/23/2021)   Exercise Vital Sign    Days of Exercise per Week: 0 days    Minutes of Exercise per Session: 0 min  Stress: No Stress Concern Present (11/23/2021)   [employer] of Occupational Health - Occupational Stress Questionnaire    Feeling of Stress : Not at all  Social Connections: Socially Isolated (11/23/2021)   Social Connection and Isolation Panel [NHANES]    Frequency of Communication with Friends and Family: More than three times a week    Frequency of Social Gatherings with Friends and Family: More than three times a week    Attends Religious Services: Never    Database administrator or Organizations: No    Attends Banker Meetings: Not on file    Marital Status: Widowed  Intimate Partner Violence: Not At Risk (11/23/2021)   Humiliation, Afraid, Rape, and Kick questionnaire    Fear of Current or Ex-Partner: No    Emotionally Abused: No    Physically Abused: No    Sexually Abused: No   Current Outpatient Medications on File Prior to Visit  Medication Sig Dispense Refill   acetaminophen (TYLENOL) 500 MG tablet  Take 500 mg by mouth every 6 (six) hours as needed for moderate pain.     aspirin 81 MG tablet Take 81 mg by mouth daily.     blood glucose meter kit and supplies Dispense based on patient and insurance preference. Use up to four times daily as directed. (FOR ICD-10 E10.9, E11.9). 1 each 0   carvedilol (COREG) 25 MG tablet Take 1 tablet (25 mg total) by mouth 2 (two) times daily. 180 tablet 3   CHLORPHENIRAMINE MALEATE PO Take by mouth as needed.     colchicine 0.6 MG tablet Take 0.6 mg by mouth daily as needed. As directed     diclofenac (VOLTAREN) 75 MG EC tablet Take 1 tablet (75 mg total) by mouth 2 (two) times daily. 90 tablet 0   enalapril (VASOTEC) 10 MG tablet Take 1 tablet (10 mg total) by mouth every evening. 90 tablet 3   furosemide (LASIX) 20 MG tablet Take 1 tablet (20 mg total) by mouth 3 (three) times a week. 8 tablet 0   glucose blood (FREESTYLE TEST STRIPS) test strip Use to check blood sugars twice a day 300 each 1   lovastatin (MEVACOR) 10 MG tablet Take 1 tablet (10 mg total) by mouth at bedtime. 90 tablet 3   methimazole (TAPAZOLE) 5 MG tablet TAKE ONE TABLET BY MOUTH FOUR TIMES A WEEK 64 tablet 0   Multiple Vitamin (MULTIVITAMIN) tablet Take 1 tablet by mouth daily.     ondansetron (ZOFRAN) 4 MG tablet Take 1 tablet (4 mg total) by mouth every 8 (eight) hours as needed for nausea or vomiting. 20 tablet 0   traMADol (ULTRAM) 50 MG tablet Take 1-2 tablets (50-100 mg total) by mouth daily as needed (pain). 60 tablet 5   [DISCONTINUED] cyclobenzaprine (FLEXERIL) 5 MG tablet Take 1 tablet (5 mg total) by mouth 3 (three) times daily as  needed for muscle spasms. 30 tablet 0   No current facility-administered medications on file prior to visit.   Allergies  Allergen Reactions   Latex Rash   Wool Alcohol [Lanolin] Rash   Amitriptyline Other (See Comments)    Leg cramps, trimmers    Family History  Problem Relation Age of Onset   Diabetes Mother    Breast cancer Mother     Cervical cancer Mother    Kidney cancer Father        Kidney Cancer   Stroke Father        CVA   Supraventricular tachycardia Sister    Asthma Other        family history   PE: BP 138/70   Pulse 77   SpO2 98%  Wt Readings from Last 3 Encounters:  01/17/22 (!) 301 lb (136.5 kg)  05/12/21 (!) 306 lb (138.8 kg)  11/24/20 289 lb (131.1 kg)   Constitutional: overweight, in NAD, in wheelchair Eyes:  EOMI, no exophthalmos ENT: no neck masses, no cervical lymphadenopathy Cardiovascular: RRR, No MRG, + B pitting edema Respiratory: CTA B Musculoskeletal: no deformities Skin:no rashes Neurological: + tremor with outstretched hands  ASSESSMENT: 1. Thyrotoxicosis  2.  Thyroid nodules  3. Prediabetes  PLAN:  1. Patient with history of a low TSH, without thyrotoxic sxs: no weight loss, heat intolerance, hyperdefecation, palpitations, anxiety.  She does have tremors, which are longstanding.  She was assumed to have Graves' disease in the past but I do not have a thyroid uptake and scan or TSI antibodies to review.  She was started on methimazole, currently on a very low-dose.  She has been on this for 13 years.  No side effects. - she does not appear to have exogenous causes for the low TSH.  - We discussed that possible causes of thyrotoxicosis are:  Graves ds   Thyroiditis toxic multinodular goiter/ toxic adenoma - will check the TSH, fT3 and fT4.  I did not add TSI antibodies at today's visit, however, if the TFTs are abnormal, we may need to do so. - If the tests are abnormal, we may also need an uptake and scan to differentiate between the 3 above possible etiologies  - we discussed about possible modalities of treatment for the above conditions, to include methimazole use, radioactive iodine ablation or (last resort) surgery.  So far, she required only very low doses of methimazole, with TSH within the target range. We discussed that it is very likely that she would be able to D/C  the methimazole if the tests are normal today. - she is on carvedilol per cardiology - no signs of Graves' ophthalmopathy: she does not have any double vision, blurry vision, eye pain, chemosis. - RTC in 1 month for labs if we stop methimazole.  We may also need to repeat labs in 3 to 6 months afterwards.  However, I will have her return to clinic as needed, if the test returne abnormal.  2.  Thyroid nodules - Patient had a thyroid ultrasound in 08/2012, which showed 2 small, subcentimeter, thyroid nodules in the right inferior and superior lobe, which did not meet criteria for biopsy or follow-up -No neck compression at today's visit and no masses felt on palpation of her neck today -No further investigation is needed for this  3. Prediabetes - HbA1c today 5.7%, this was checked as patient mentions that she did not have an A1c since 2023. -She is not on any diabetic medications -However, she  is checking blood sugars usually, but her meter is defective.  She will go to [facility] today to buy another one. -Further management per PCP  Needs refills.  - Total time spent for the visit: 40 min, in precharting, postcharting, reviewing Dr. George Hugh last note, obtaining medical information from the chart and from the pt, reviewing her  previous labs, evaluations, and treatments, reviewing her symptoms, counseling her about her endocrine conditions (please see the discussed topics above), and developing a plan to further investigate and treat them.  Component     Latest Ref Rng 10/12/2022  TSH     0.35 - 5.50 uIU/mL 1.88   T4,Free(Direct)     0.60 - 1.60 ng/dL 1.61   Triiodothyronine,Free,Serum     2.3 - 4.2 pg/mL 5.6 (H)   TSH and free T4 are normal, but free T3 is elevated.  At this point, I would suggest to try off the methimazole and see if the thyroid tests change significantly.  We will repeat the test in 4 to 5 weeks.  At that time, I will add a TSI antibody level.  Carlus Pavlov, MD  PhD [facility] Endocrinology

## 2022-10-12 NOTE — Patient Instructions (Addendum)
Please stop at the lab.  Please continue Methimazole 5 mg 4x a week.  Please return to see me as needed.

## 2022-10-13 ENCOUNTER — Encounter: Payer: Self-pay | Admitting: Internal Medicine

## 2022-10-13 LAB — T3, FREE: T3, Free: 5.6 pg/mL — ABNORMAL HIGH (ref 2.3–4.2)

## 2022-10-13 LAB — TSH: TSH: 1.88 u[IU]/mL (ref 0.35–5.50)

## 2022-10-13 LAB — T4, FREE: Free T4: 1.26 ng/dL (ref 0.60–1.60)

## 2022-10-16 ENCOUNTER — Other Ambulatory Visit: Payer: Self-pay | Admitting: Internal Medicine

## 2022-10-16 ENCOUNTER — Telehealth: Payer: Self-pay | Admitting: Internal Medicine

## 2022-10-16 DIAGNOSIS — M1712 Unilateral primary osteoarthritis, left knee: Secondary | ICD-10-CM

## 2022-10-16 DIAGNOSIS — M1711 Unilateral primary osteoarthritis, right knee: Secondary | ICD-10-CM

## 2022-10-16 DIAGNOSIS — Z760 Encounter for issue of repeat prescription: Secondary | ICD-10-CM

## 2022-10-16 MED ORDER — METHIMAZOLE 5 MG PO TABS: ORAL_TABLET | ORAL | 1 refills | Status: DC

## 2022-10-16 NOTE — Telephone Encounter (Signed)
Caller & Relationship to patient: Self  Call back number: [phone]   Date of last office visit: 10.2.23  Date of next office visit: 12.6.24  Medication(s) to be refilled:  traMADol (ULTRAM) 50 MG tablet  & diclofenac (VOLTAREN) 75 MG EC tablet   Preferred Pharmacy:  [facility]  Phone: [phone]  Fax: [phone]    Pt has limited mobility and transportation and is requesting a 90 day supply of her medication

## 2022-10-16 NOTE — Telephone Encounter (Signed)
Please advise 

## 2022-10-17 NOTE — Telephone Encounter (Signed)
I have already addressed this cannot get refill without visit scheduled at least. It can be any kind of visit but we cannot do telephone only.

## 2022-10-17 NOTE — Telephone Encounter (Signed)
Schedule a virtual visit due to transportation  issues?

## 2022-10-18 NOTE — Telephone Encounter (Signed)
Patient has been scheduled

## 2022-10-19 ENCOUNTER — Other Ambulatory Visit: Payer: Self-pay

## 2022-10-19 DIAGNOSIS — Z760 Encounter for issue of repeat prescription: Secondary | ICD-10-CM

## 2022-10-19 MED ORDER — DICLOFENAC SODIUM 75 MG PO TBEC
75.0000 mg | DELAYED_RELEASE_TABLET | Freq: Two times a day (BID) | ORAL | 0 refills | Status: DC
Start: 2022-10-19 — End: 2023-01-25

## 2022-10-19 NOTE — Telephone Encounter (Signed)
She has visit on 12/6 which is not acceptable. She will be able to get 30 days or less of medication needs visit within that time frame. She is overdue for visit.

## 2022-10-19 NOTE — Telephone Encounter (Signed)
Pt has been scheduled for OV on 10/8 please send in 30 day supply for her tramadol to [facility] on [address]

## 2022-10-20 ENCOUNTER — Encounter: Payer: Self-pay | Admitting: Internal Medicine

## 2022-10-20 MED ORDER — TRAMADOL HCL 50 MG PO TABS: 50.0000 mg | ORAL_TABLET | Freq: Every day | ORAL | 0 refills | Status: DC | PRN

## 2022-10-20 NOTE — Telephone Encounter (Signed)
Sent in 30 day

## 2022-10-20 NOTE — Telephone Encounter (Signed)
Patient would like Katie Higgins to call her back.

## 2022-10-23 NOTE — Telephone Encounter (Signed)
Called patient and is waiting on a return call

## 2022-11-14 ENCOUNTER — Ambulatory Visit: Payer: Medicare Other | Admitting: Internal Medicine

## 2022-11-24 ENCOUNTER — Encounter: Payer: Self-pay | Admitting: Internal Medicine

## 2022-12-19 ENCOUNTER — Other Ambulatory Visit: Payer: Self-pay

## 2022-12-19 DIAGNOSIS — I5032 Chronic diastolic (congestive) heart failure: Secondary | ICD-10-CM

## 2022-12-19 DIAGNOSIS — I493 Ventricular premature depolarization: Secondary | ICD-10-CM

## 2022-12-19 DIAGNOSIS — R0602 Shortness of breath: Secondary | ICD-10-CM

## 2022-12-19 MED ORDER — ENALAPRIL MALEATE 10 MG PO TABS: 10.0000 mg | ORAL_TABLET | Freq: Every evening | ORAL | 0 refills | Status: DC

## 2022-12-19 MED ORDER — CARVEDILOL 25 MG PO TABS: 25.0000 mg | ORAL_TABLET | Freq: Two times a day (BID) | ORAL | 0 refills | Status: DC

## 2022-12-19 MED ORDER — LOVASTATIN 10 MG PO TABS: 10.0000 mg | ORAL_TABLET | Freq: Every day | ORAL | 0 refills | Status: DC

## 2023-01-12 ENCOUNTER — Ambulatory Visit: Payer: Medicare Other | Admitting: Internal Medicine

## 2023-01-12 ENCOUNTER — Ambulatory Visit: Payer: Medicare Other | Admitting: Physician Assistant

## 2023-01-12 NOTE — Progress Notes (Unsigned)
Cardiology Office Note Date:  01/12/2023  Patient ID:  Katie Higgins, Katie Higgins 08-13-1950, MRN [id number] PCP:  Myrlene Broker, MD  Cardiologist:  Dr. Shirlee Latch (2017) EP: Dr. Johney Frame Endo: Dr. Everardo All    Chief Complaint:   *** annual visit  History of Present Illness: Katie Higgins is a 72 y.o. female with history of hyperthyroidism, PVCs, DM, HLD, HTN, CKD (III), morbid obesity. 2011 w/u noted CM > LHC w/normal coronaries.   CM suspect 2/2 hyperthyroidism, and did improve with therapy.     She saw Dr. Johney Frame 07/25/17, PVCs well controlled, no changes made, planned for follow up w/EP APP annually, him PRN  I saw her 07/14/2019 She is accompanied by her son today.  She is in a wheelchair, but reports that she is able to ambulates some.  She has bad OA of her hips and knees that is why she uses a wheelchair.  She says she can stand long enough to cook, wash up, able to do her ADLs independently.  She denies any palpitations or awareness of any PVCs if she is having any.  She will rarely get a fleeting/momentary "pang" in the center of her chest.  maybe the last was 6 mo or so ago.  No rest SOB, no DOE at her level of exertinal.  She denies symptoms of orthopnea or PND.   No dizzy spells, near syncope or syncope.  She has noticed swelling in her feet for the last 3 weeks, it is nearly if not completely resolved in the AM, but then some days uncomfortably swollen.  She notes this is associated with an aching discomfort to the very lateral edges of her feet, like she has been walking on the edge of her feet, or has a "rock bruise"  Edema sounded more of a dependent edema, Recommend she take her lasix QD for 3 days then resume 3d/week, discussed minimizing sodium and elevating feet when seated.  If her lasix requirements remain increased, will update her echo, she is alsked to let us know. No PVCs on her EKG.  01/12/20 with me She is accompanied by her son again today She lives in her own  home, essentially wheelchair bound for ambulating 2/2 her OA and terrible knee pain, but once in the kitchen stands to cook, do dishes, etc  Able to care for herself. She denies any CP, palpitations or SOB, no cardiac awareness. She denies any symptoms of PND or orthopnea. Tolerating her medicines well No recurrent edema that she noticed Exam was without extrasystoles, no symptoms or exam findings to suggest volume OL or cardiac changes.  Planned for 105mo visit > 1 year if she remained clinically stable cardiac-wise.  I saw her 11/24/20 Again, is accompanied by her son. She is doing well.  Had COVID early this year though did OK. 48mo ago she had a single episode of a fleeting/momentary flutter in her heart beat.  None otherwise with no cardiac awareness She will have a sense of chest heaviness/pulling when not wearing a bra particularly L breast/sided, that is resolved with wearing a bra and knows this is not her heart. No enar syncope or syncope No edema No changes in her baseline capacity to take care of her self, does self care well, able to get into and out of the chair by her self, just can not ambulate any real distance 2/2 severe OA of her hips/knees Her endocrinologist released her to follow thyroid with her PMD, she also manages  her lipids.  Felt to be doing well, no symptoms or exam findings to suggest any clinical changes, not felt to need a new echo.  Planned for an annual visit  I saw her 12/05/21 She is accompanied by her son. She is doing well, very limited physically with er arthritis unfortunately. No cardiac concerns, no CP, palpitations or cardiac awareness No SOB No near syncope or syncope. When first laying down in bed feels a little dizzy for some reason. She mentions that when she is getting to the time to take her next metoprolol she feels little off, a bit like a slump in energy, she assumes that her medicine is wearing off and will take it early. Her home BP is  120's/60's Case EKG reviewed with Dr. Elberta Fortis, given time since seeing MD, no changes needed, planned to see him in a year  Placed again on my schedule  TODAY *** symptoms *** PVCs *** labs, lipids... *** MD Only next  Past Medical History:  Diagnosis Date   ALLERGIC RHINITIS    CARDIOMYOPATHY    Nonischemic. Admitted in 3/11 with CHF exac, due to hyperthyroidism. LHC (03/11) showed clean coronaries with EF 40%. Myoview showed EF 38%. Echo was a technically difficult study with mild to moderately decreased EF, mild LVH, mild MR. Repeat echo (9/11) with EF 55-60%, mild diastolic dysfunction (grade I).   CARPAL TUNNEL SYNDROME, LEFT, MILD    CKD (chronic kidney disease), stage III (HCC)    Congestive heart failure, unspecified    DIABETES MELLITUS, CONTROLLED    DYSLIPIDEMIA    Graves disease    HYPERTENSION    HYPERTHYROIDISM    Obesity    Osteoarthritis    esp L hip, low back   Palpitations    Premature ventricular contraction     Past Surgical History:  Procedure Laterality Date   CARDIAC CATHETERIZATION  04/19/09   CARDIAC CATHETERIZATION N/A 09/30/2015   Procedure: Left Heart Cath and Coronary Angiography;  Surgeon: Laurey Morale, MD;  Location: [facility] INVASIVE CV LAB;  Service: Cardiovascular;  Laterality: N/A;   CHOLECYSTECTOMY     remote hernia repair      Current Outpatient Medications  Medication Sig Dispense Refill   acetaminophen (TYLENOL) 500 MG tablet Take 500 mg by mouth every 6 (six) hours as needed for moderate pain.     aspirin 81 MG tablet Take 81 mg by mouth daily.     blood glucose meter kit and supplies Dispense based on patient and insurance preference. Use up to four times daily as directed. (FOR ICD-10 E10.9, E11.9). 1 each 0   carvedilol (COREG) 25 MG tablet Take 1 tablet (25 mg total) by mouth 2 (two) times daily. 90 tablet 0   CHLORPHENIRAMINE MALEATE PO Take by mouth as needed.     colchicine 0.6 MG tablet Take 0.6 mg by mouth daily as needed. As  directed     diclofenac (VOLTAREN) 75 MG EC tablet Take 1 tablet (75 mg total) by mouth 2 (two) times daily. 30 tablet 0   enalapril (VASOTEC) 10 MG tablet Take 1 tablet (10 mg total) by mouth every evening. 30 tablet 0   furosemide (LASIX) 20 MG tablet Take 1 tablet (20 mg total) by mouth 3 (three) times a week. 8 tablet 0   glucose blood (FREESTYLE TEST STRIPS) test strip Use to check blood sugars twice a day 300 each 1   lovastatin (MEVACOR) 10 MG tablet Take 1 tablet (10 mg total) by mouth  at bedtime. 30 tablet 0   methimazole (TAPAZOLE) 5 MG tablet Take 1 tablet by mouth 4 times a week 30 tablet 1   Multiple Vitamin (MULTIVITAMIN) tablet Take 1 tablet by mouth daily.     ondansetron (ZOFRAN) 4 MG tablet Take 1 tablet (4 mg total) by mouth every 8 (eight) hours as needed for nausea or vomiting. 20 tablet 0   traMADol (ULTRAM) 50 MG tablet Take 1-2 tablets (50-100 mg total) by mouth daily as needed (pain). 60 tablet 0   No current facility-administered medications for this visit.    Allergies:   Latex, Wool alcohol [lanolin], and Amitriptyline   Social History:  The patient  reports that she has never smoked. She has never used smokeless tobacco. She reports that she does not drink alcohol and does not use drugs.   Family History:  The patient's family history includes Asthma in an other family member; Breast cancer in her mother; Cervical cancer in her mother; Diabetes in her mother; Kidney cancer in her father; Stroke in her father; Supraventricular tachycardia in her sister.  ROS:  Please see the history of present illness.  All other systems are reviewed and otherwise negative.   PHYSICAL EXAM:  VS:  There were no vitals taken for this visit. BMI: There is no height or weight on file to calculate BMI. Well nourished, well developed, in no acute distress  HEENT: normocephalic, atraumatic  Neck: no JVD, carotid bruits or masses Cardiac: *** RRR; no significant murmurs, no rubs, or  gallops,  no extrasystoles noted with prolonged auscultation Lungs:  *** CTA b/l, no wheezing, rhonchi or rales  Abd: soft, nontender,  she has a very large pannus MS: no deformity or atrophy Ext: *** no edema b/l Skin: warm and dry, no rash Neuro:  No gross deficits appreciated Psych: euthymic mood, full affect   EKG: done today and reviewed by myself ***  01/17/22: SR 68bpm , normal intervals, no PVCs  03/17/20 EKGs previously reviewed:  SR 68bpm, no PVCs SR 79bpm, 2 PVCs  05/11/2016: TTE Study Conclusions  - Left ventricle: The cavity size was normal. Systolic function was    normal. The estimated ejection fraction was in the range of 55%    to 60%. Wall motion was normal; there were no regional wall    motion abnormalities. Doppler parameters are consistent with    abnormal left ventricular relaxation (grade 1 diastolic    dysfunction). Doppler parameters are consistent with elevated    ventricular end-diastolic filling pressure. Mean left atrial    pressure appears to be normal at rest.    09/30/2015: LHC No obstructive coronary disease.  Small caliber distal LAD but similar to prior study.    Recent Labs: 10/12/2022: TSH 1.88  No results found for requested labs within last 365 days.   CrCl cannot be calculated (Patient's most recent lab result is older than the maximum 21 days allowed.).   Wt Readings from Last 3 Encounters:  01/17/22 (!) 301 lb (136.5 kg)  05/12/21 (!) 306 lb (138.8 kg)  11/24/20 289 lb (131.1 kg)     Other studies reviewed: Additional studies/records reviewed today include: summarized above  ASSESSMENT AND PLAN:  1. PVCs     *** No symptoms to suggest any significant burden.     *** None noted again today with prolonged auscultation  2. NICM     *** Recovered LVEF, suspect 2/2 hyperthyroidism       3. HTN    ***  Looks good, no changes      *** Given time since a MD visit and need for a new EP MD, discussed transitioning to Dr.  Elberta Fortis.  She is agreeable. ***     Disposition: ***   Current medicines are reviewed at length with the patient today.  The patient did not have any concerns regarding medicines  Signed, Francis Dowse, PA-C 01/12/2023 7:19 AM     [facility] [address] [place] [address] [phone] (office)  [phone] (fax)

## 2023-01-15 ENCOUNTER — Ambulatory Visit: Payer: Medicare Other | Admitting: Internal Medicine

## 2023-01-25 ENCOUNTER — Ambulatory Visit: Payer: Medicare Other | Admitting: Internal Medicine

## 2023-01-25 ENCOUNTER — Encounter: Payer: Self-pay | Admitting: Internal Medicine

## 2023-01-25 VITALS — BP 118/80 | HR 79 | Temp 98.4°F | Ht 67.0 in | Wt 275.0 lb

## 2023-01-25 DIAGNOSIS — Z23 Encounter for immunization: Secondary | ICD-10-CM | POA: Diagnosis not present

## 2023-01-25 DIAGNOSIS — I10 Essential (primary) hypertension: Secondary | ICD-10-CM

## 2023-01-25 DIAGNOSIS — N1831 Chronic kidney disease, stage 3a: Secondary | ICD-10-CM

## 2023-01-25 DIAGNOSIS — E041 Nontoxic single thyroid nodule: Secondary | ICD-10-CM

## 2023-01-25 DIAGNOSIS — M1711 Unilateral primary osteoarthritis, right knee: Secondary | ICD-10-CM | POA: Diagnosis not present

## 2023-01-25 DIAGNOSIS — I5032 Chronic diastolic (congestive) heart failure: Secondary | ICD-10-CM

## 2023-01-25 DIAGNOSIS — Z760 Encounter for issue of repeat prescription: Secondary | ICD-10-CM | POA: Diagnosis not present

## 2023-01-25 DIAGNOSIS — E782 Mixed hyperlipidemia: Secondary | ICD-10-CM | POA: Diagnosis not present

## 2023-01-25 DIAGNOSIS — Z8639 Personal history of other endocrine, nutritional and metabolic disease: Secondary | ICD-10-CM | POA: Diagnosis not present

## 2023-01-25 DIAGNOSIS — M1712 Unilateral primary osteoarthritis, left knee: Secondary | ICD-10-CM | POA: Diagnosis not present

## 2023-01-25 LAB — CBC
HCT: 35.1 % — ABNORMAL LOW (ref 36.0–46.0)
Hemoglobin: 11.4 g/dL — ABNORMAL LOW (ref 12.0–15.0)
MCHC: 32.6 g/dL (ref 30.0–36.0)
MCV: 90.8 fL (ref 78.0–100.0)
Platelets: 272 10*3/uL (ref 150.0–400.0)
RBC: 3.87 Mil/uL (ref 3.87–5.11)
RDW: 15.6 % — ABNORMAL HIGH (ref 11.5–15.5)
WBC: 7.5 10*3/uL (ref 4.0–10.5)

## 2023-01-25 LAB — COMPREHENSIVE METABOLIC PANEL
ALT: 9 U/L (ref 0–35)
AST: 11 U/L (ref 0–37)
Albumin: 3.9 g/dL (ref 3.5–5.2)
Alkaline Phosphatase: 66 U/L (ref 39–117)
BUN: 28 mg/dL — ABNORMAL HIGH (ref 6–23)
CO2: 22 meq/L (ref 19–32)
Calcium: 8.3 mg/dL — ABNORMAL LOW (ref 8.4–10.5)
Chloride: 109 meq/L (ref 96–112)
Creatinine, Ser: 1.18 mg/dL (ref 0.40–1.20)
GFR: 45.88 mL/min — ABNORMAL LOW (ref >60.00)
Glucose, Bld: 130 mg/dL — ABNORMAL HIGH (ref 70–99)
Potassium: 5.2 meq/L — ABNORMAL HIGH (ref 3.5–5.1)
Sodium: 139 meq/L (ref 135–145)
Total Bilirubin: 0.7 mg/dL (ref 0.2–1.2)
Total Protein: 6.8 g/dL (ref 6.0–8.3)

## 2023-01-25 LAB — LIPID PANEL
Cholesterol: 112 mg/dL (ref 0–200)
HDL: 46 mg/dL (ref >39.00)
LDL Cholesterol: 55 mg/dL (ref 0–99)
NonHDL: 65.64
Total CHOL/HDL Ratio: 2
Triglycerides: 55 mg/dL (ref 0.0–149.0)
VLDL: 11 mg/dL (ref 0.0–40.0)

## 2023-01-25 LAB — T4, FREE: Free T4: 1.13 ng/dL (ref 0.60–1.60)

## 2023-01-25 LAB — TSH: TSH: 2.51 u[IU]/mL (ref 0.35–5.50)

## 2023-01-25 LAB — HEMOGLOBIN A1C: Hgb A1c MFr Bld: 6 % (ref 4.6–6.5)

## 2023-01-25 LAB — T3, FREE: T3, Free: 4.7 pg/mL — ABNORMAL HIGH (ref 2.3–4.2)

## 2023-01-25 MED ORDER — ONDANSETRON HCL 4 MG PO TABS: 4.0000 mg | ORAL_TABLET | Freq: Three times a day (TID) | ORAL | 0 refills | Status: DC | PRN

## 2023-01-25 MED ORDER — DICLOFENAC SODIUM 75 MG PO TBEC
75.0000 mg | DELAYED_RELEASE_TABLET | Freq: Two times a day (BID) | ORAL | 3 refills | Status: DC
Start: 2023-01-25 — End: 2023-09-05

## 2023-01-25 MED ORDER — TRAMADOL HCL 50 MG PO TABS: 50.0000 mg | ORAL_TABLET | Freq: Every day | ORAL | 5 refills | Status: DC | PRN

## 2023-01-25 NOTE — Assessment & Plan Note (Signed)
Checking HgA1c and lipid panel and microalbumin to creatinine ratio Not on meds and prior readings last few years in pre-diabetes range.

## 2023-01-25 NOTE — Assessment & Plan Note (Signed)
She did not follow up as expected for labs due to mobility issues it is hard to her to get out of home. Checking labs and will forward to endo including TSH, free T4 and T3 and thyroid antibody.

## 2023-01-25 NOTE — Assessment & Plan Note (Signed)
Counseled about diet and exercise to help.

## 2023-01-25 NOTE — Progress Notes (Signed)
   Subjective:   Patient ID: Katie Higgins, female    DOB: Aug 30, 1950, 72 y.o.   MRN: [id number]  HPI The patient is a 72 YO female coming in for follow up medical management see A/P.   Review of Systems  Constitutional:  Positive for activity change.  HENT: Negative.    Eyes: Negative.   Respiratory:  Negative for cough, chest tightness and shortness of breath.   Cardiovascular:  Negative for chest pain, palpitations and leg swelling.  Gastrointestinal:  Negative for abdominal distention, abdominal pain, constipation, diarrhea, nausea and vomiting.  Musculoskeletal:  Positive for arthralgias, back pain, gait problem and myalgias.  Skin: Negative.   Psychiatric/Behavioral: Negative.      Objective:  Physical Exam Constitutional:      Appearance: She is well-developed. She is obese.  HENT:     Head: Normocephalic and atraumatic.  Cardiovascular:     Rate and Rhythm: Normal rate and regular rhythm.  Pulmonary:     Effort: Pulmonary effort is normal. No respiratory distress.     Breath sounds: Normal breath sounds. No wheezing or rales.  Abdominal:     General: Bowel sounds are normal. There is no distension.     Palpations: Abdomen is soft.     Tenderness: There is no abdominal tenderness. There is no rebound.  Musculoskeletal:        General: Tenderness present.     Cervical back: Normal range of motion.  Skin:    General: Skin is warm and dry.     Comments: Foot exam done  Neurological:     Mental Status: She is alert and oriented to person, place, and time.     Coordination: Coordination abnormal.     Vitals:   01/25/23 0941  BP: 118/80  Pulse: 79  Temp: 98.4 F (36.9 C)  TempSrc: Oral  SpO2: 98%  Weight: 275 lb (124.7 kg)  Height: 5\' 7"  (1.702 m)    Assessment & Plan:  Flu shot given at visit

## 2023-01-25 NOTE — Assessment & Plan Note (Signed)
Checking CMP and adjust as needed.  

## 2023-01-25 NOTE — Assessment & Plan Note (Signed)
Checking lipid panel and adjust lovasatin 40 mg daily

## 2023-01-25 NOTE — Assessment & Plan Note (Signed)
No flare today and is taking coreg and aspirin and lasix.

## 2023-01-25 NOTE — Patient Instructions (Signed)
We will check all the labs today and have sent in the refills

## 2023-01-25 NOTE — Assessment & Plan Note (Signed)
Uses tramadol 50-100 mg daily. Refilled for 6 month supply.

## 2023-01-25 NOTE — Assessment & Plan Note (Signed)
BP at goal on lasix and enalapril and coreg. Checking CMP and adjust as needed.

## 2023-01-29 LAB — THYROID STIMULATING IMMUNOGLOBULIN

## 2023-01-29 LAB — EXTRA SPECIMEN

## 2023-01-30 NOTE — Progress Notes (Deleted)
Cardiology Office Note Date:  01/30/2023  Patient ID:  Cherrish, Deitering May 15, 1950, MRN [id number] PCP:  Myrlene Broker, MD  Cardiologist:  Dr. Shirlee Latch (2017) EP: Dr. Johney Frame Endo: Dr. Everardo All    Chief Complaint:   *** annual visit  History of Present Illness: DANECIA ROULETTE is a 72 y.o. female with history of hyperthyroidism, PVCs, DM, HLD, HTN, CKD (III), morbid obesity. 2011 w/u noted CM > LHC w/normal coronaries.   CM suspect 2/2 hyperthyroidism, and did improve with therapy.     She saw Dr. Johney Frame 07/25/17, PVCs well controlled, no changes made, planned for follow up w/EP APP annually, him PRN  I saw her 07/14/2019 She is accompanied by her son today.  She is in a wheelchair, but reports that she is able to ambulates some.  She has bad OA of her hips and knees that is why she uses a wheelchair.  She says she can stand long enough to cook, wash up, able to do her ADLs independently.  She denies any palpitations or awareness of any PVCs if she is having any.  She will rarely get a fleeting/momentary "pang" in the center of her chest.  maybe the last was 6 mo or so ago.  No rest SOB, no DOE at her level of exertinal.  She denies symptoms of orthopnea or PND.   No dizzy spells, near syncope or syncope.  She has noticed swelling in her feet for the last 3 weeks, it is nearly if not completely resolved in the AM, but then some days uncomfortably swollen.  She notes this is associated with an aching discomfort to the very lateral edges of her feet, like she has been walking on the edge of her feet, or has a "rock bruise"  Edema sounded more of a dependent edema, Recommend she take her lasix QD for 3 days then resume 3d/week, discussed minimizing sodium and elevating feet when seated.  If her lasix requirements remain increased, will update her echo, she is alsked to let us know. No PVCs on her EKG.  01/12/20 with me She is accompanied by her son again today She lives in her own  home, essentially wheelchair bound for ambulating 2/2 her OA and terrible knee pain, but once in the kitchen stands to cook, do dishes, etc  Able to care for herself. She denies any CP, palpitations or SOB, no cardiac awareness. She denies any symptoms of PND or orthopnea. Tolerating her medicines well No recurrent edema that she noticed Exam was without extrasystoles, no symptoms or exam findings to suggest volume OL or cardiac changes.  Planned for 9mo visit > 1 year if she remained clinically stable cardiac-wise.  I saw her 11/24/20 Again, is accompanied by her son. She is doing well.  Had COVID early this year though did OK. 91mo ago she had a single episode of a fleeting/momentary flutter in her heart beat.  None otherwise with no cardiac awareness She will have a sense of chest heaviness/pulling when not wearing a bra particularly L breast/sided, that is resolved with wearing a bra and knows this is not her heart. No enar syncope or syncope No edema No changes in her baseline capacity to take care of her self, does self care well, able to get into and out of the chair by her self, just can not ambulate any real distance 2/2 severe OA of her hips/knees Her endocrinologist released her to follow thyroid with her PMD, she also manages  her lipids.  Felt to be doing well, no symptoms or exam findings to suggest any clinical changes, not felt to need a new echo.  Planned for an annual visit  I saw her 12/05/21 She is accompanied by her son. She is doing well, very limited physically with er arthritis unfortunately. No cardiac concerns, no CP, palpitations or cardiac awareness No SOB No near syncope or syncope. When first laying down in bed feels a little dizzy for some reason. She mentions that when she is getting to the time to take her next metoprolol she feels little off, a bit like a slump in energy, she assumes that her medicine is wearing off and will take it early. Her home BP is  120's/60's Case EKG reviewed with Dr. Elberta Fortis, given time since seeing MD, no changes needed, planned to see him in a year  Placed again on my schedule  TODAY *** symptoms *** PVCs *** labs, lipids... *** MD Only next  Past Medical History:  Diagnosis Date   ALLERGIC RHINITIS    CARDIOMYOPATHY    Nonischemic. Admitted in 3/11 with CHF exac, due to hyperthyroidism. LHC (03/11) showed clean coronaries with EF 40%. Myoview showed EF 38%. Echo was a technically difficult study with mild to moderately decreased EF, mild LVH, mild MR. Repeat echo (9/11) with EF 55-60%, mild diastolic dysfunction (grade I).   CARPAL TUNNEL SYNDROME, LEFT, MILD    CKD (chronic kidney disease), stage III (HCC)    Congestive heart failure, unspecified    DIABETES MELLITUS, CONTROLLED    DYSLIPIDEMIA    Graves disease    HYPERTENSION    HYPERTHYROIDISM    Obesity    Osteoarthritis    esp L hip, low back   Palpitations    Premature ventricular contraction     Past Surgical History:  Procedure Laterality Date   CARDIAC CATHETERIZATION  04/19/09   CARDIAC CATHETERIZATION N/A 09/30/2015   Procedure: Left Heart Cath and Coronary Angiography;  Surgeon: Laurey Morale, MD;  Location: [facility] INVASIVE CV LAB;  Service: Cardiovascular;  Laterality: N/A;   CHOLECYSTECTOMY     remote hernia repair      Current Outpatient Medications  Medication Sig Dispense Refill   acetaminophen (TYLENOL) 500 MG tablet Take 500 mg by mouth every 6 (six) hours as needed for moderate pain.     aspirin 81 MG tablet Take 81 mg by mouth daily.     blood glucose meter kit and supplies Dispense based on patient and insurance preference. Use up to four times daily as directed. (FOR ICD-10 E10.9, E11.9). 1 each 0   carvedilol (COREG) 25 MG tablet Take 1 tablet (25 mg total) by mouth 2 (two) times daily. 90 tablet 0   CHLORPHENIRAMINE MALEATE PO Take by mouth as needed.     colchicine 0.6 MG tablet Take 0.6 mg by mouth daily as needed. As  directed     diclofenac (VOLTAREN) 75 MG EC tablet Take 1 tablet (75 mg total) by mouth 2 (two) times daily. 180 tablet 3   enalapril (VASOTEC) 10 MG tablet Take 1 tablet (10 mg total) by mouth every evening. 30 tablet 0   furosemide (LASIX) 20 MG tablet Take 1 tablet (20 mg total) by mouth 3 (three) times a week. 8 tablet 0   glucose blood (FREESTYLE TEST STRIPS) test strip Use to check blood sugars twice a day 300 each 1   lovastatin (MEVACOR) 10 MG tablet Take 1 tablet (10 mg total) by mouth  at bedtime. 30 tablet 0   methimazole (TAPAZOLE) 5 MG tablet Take 1 tablet by mouth 4 times a week 30 tablet 1   Multiple Vitamin (MULTIVITAMIN) tablet Take 1 tablet by mouth daily.     ondansetron (ZOFRAN) 4 MG tablet Take 1 tablet (4 mg total) by mouth every 8 (eight) hours as needed for nausea or vomiting. 20 tablet 0   traMADol (ULTRAM) 50 MG tablet Take 1-2 tablets (50-100 mg total) by mouth daily as needed (pain). 60 tablet 5   No current facility-administered medications for this visit.    Allergies:   Latex, Wool alcohol [lanolin], and Amitriptyline   Social History:  The patient  reports that she has never smoked. She has never used smokeless tobacco. She reports that she does not drink alcohol and does not use drugs.   Family History:  The patient's family history includes Asthma in an other family member; Breast cancer in her mother; Cervical cancer in her mother; Diabetes in her mother; Kidney cancer in her father; Stroke in her father; Supraventricular tachycardia in her sister.  ROS:  Please see the history of present illness.  All other systems are reviewed and otherwise negative.   PHYSICAL EXAM:  VS:  There were no vitals taken for this visit. BMI: There is no height or weight on file to calculate BMI. Well nourished, well developed, in no acute distress  HEENT: normocephalic, atraumatic  Neck: no JVD, carotid bruits or masses Cardiac: *** RRR; no significant murmurs, no rubs, or  gallops,  no extrasystoles noted with prolonged auscultation Lungs:  *** CTA b/l, no wheezing, rhonchi or rales  Abd: soft, nontender,  she has a very large pannus MS: no deformity or atrophy Ext: *** no edema b/l Skin: warm and dry, no rash Neuro:  No gross deficits appreciated Psych: euthymic mood, full affect   EKG: done today and reviewed by myself ***  01/17/22: SR 68bpm , normal intervals, no PVCs  03/17/20 EKGs previously reviewed:  SR 68bpm, no PVCs SR 79bpm, 2 PVCs  05/11/2016: TTE Study Conclusions  - Left ventricle: The cavity size was normal. Systolic function was    normal. The estimated ejection fraction was in the range of 55%    to 60%. Wall motion was normal; there were no regional wall    motion abnormalities. Doppler parameters are consistent with    abnormal left ventricular relaxation (grade 1 diastolic    dysfunction). Doppler parameters are consistent with elevated    ventricular end-diastolic filling pressure. Mean left atrial    pressure appears to be normal at rest.    09/30/2015: LHC No obstructive coronary disease.  Small caliber distal LAD but similar to prior study.    Recent Labs: 01/25/2023: ALT 9; BUN 28; Creatinine, Ser 1.18; Hemoglobin 11.4; Platelets 272.0; Potassium 5.2 No hemolysis seen; Sodium 139; TSH 2.51  01/25/2023: Cholesterol 112; HDL 46.00; LDL Cholesterol 55; Total CHOL/HDL Ratio 2; Triglycerides 55.0; VLDL 11.0   Estimated Creatinine Clearance: 58.2 mL/min (by C-G formula based on SCr of 1.18 mg/dL).   Wt Readings from Last 3 Encounters:  01/25/23 275 lb (124.7 kg)  01/17/22 (!) 301 lb (136.5 kg)  05/12/21 (!) 306 lb (138.8 kg)     Other studies reviewed: Additional studies/records reviewed today include: summarized above  ASSESSMENT AND PLAN:  1. PVCs     *** No symptoms to suggest any significant burden.     *** None noted again today with prolonged auscultation  2. NICM     ***  Recovered LVEF, suspect 2/2  hyperthyroidism       3. HTN    ***  Looks good, no changes      *** Given time since a MD visit and need for a new EP MD, discussed transitioning to Dr. Elberta Fortis.  She is agreeable. ***     Disposition: ***   Current medicines are reviewed at length with the patient today.  The patient did not have any concerns regarding medicines  Signed, Francis Dowse, PA-C 01/30/2023 7:51 AM     [facility] [address] [place] [address] [phone] (office)  [phone] (fax)

## 2023-02-01 ENCOUNTER — Ambulatory Visit: Payer: Medicare Other | Admitting: Physician Assistant

## 2023-02-01 ENCOUNTER — Telehealth: Payer: Self-pay | Admitting: Physician Assistant

## 2023-02-01 DIAGNOSIS — R0602 Shortness of breath: Secondary | ICD-10-CM

## 2023-02-01 DIAGNOSIS — I493 Ventricular premature depolarization: Secondary | ICD-10-CM

## 2023-02-01 DIAGNOSIS — I5032 Chronic diastolic (congestive) heart failure: Secondary | ICD-10-CM

## 2023-02-01 MED ORDER — LOVASTATIN 10 MG PO TABS: 10.0000 mg | ORAL_TABLET | Freq: Every day | ORAL | 0 refills | Status: DC

## 2023-02-01 MED ORDER — ENALAPRIL MALEATE 10 MG PO TABS: 10.0000 mg | ORAL_TABLET | Freq: Every evening | ORAL | 0 refills | Status: DC

## 2023-02-01 NOTE — Telephone Encounter (Signed)
*  STAT* If patient is at the pharmacy, call can be transferred to refill team.   1. Which medications need to be refilled? (please list name of each medication and dose if known) enalapril (VASOTEC) 10 MG tablet   lovastatin (MEVACOR) 10 MG tablet    2. Would you like to learn more about the convenience, safety, & potential cost savings by using the [facility]? No   3. Are you open to using the [facility] (Type [facility]. )No   4. Which pharmacy/location (including street and city if local pharmacy) is medication to be sent to?  [facility] - [place], [place] - [address]     5. Do they need a 30 day or 90 day supply? 30 day  Pt has scheduled appt on 03/08/23

## 2023-02-03 ENCOUNTER — Encounter: Payer: Self-pay | Admitting: Internal Medicine

## 2023-02-05 MED ORDER — METHIMAZOLE 5 MG PO TABS: ORAL_TABLET | ORAL | 0 refills | Status: AC

## 2023-03-08 ENCOUNTER — Ambulatory Visit: Payer: Medicare Other | Admitting: Physician Assistant

## 2023-03-25 NOTE — Progress Notes (Deleted)
 Cardiology Office Note Date:  03/25/2023  Patient ID:  Katie, Higgins 09-27-1950, MRN [id number] PCP:  Myrlene Broker, MD  Cardiologist:  Dr. Shirlee Latch (2017) EP: Dr. Johney Frame (inactive) Endo: Dr. Everardo All    Chief Complaint:   *** annual visit  History of Present Illness: Katie Higgins is a 73 y.o. female with history of hyperthyroidism, PVCs, DM, HLD, HTN, CKD (III), morbid obesity. 2011 w/u noted CM > LHC w/normal coronaries.   CM suspect 2/2 hyperthyroidism, and did improve with therapy.    She was referred to Dr. Johney Frame who felt with minimial symptoms from PVCs and imporvement of her EF, pt preference to avoid invasive procedure, no plans for EP/ablation  She saw A. Glory Buff, NP 2018, described as essentially wheelchair bound, no symptoms of PVCs, planned to update her echo, if remained preserved LVEF to continue medical therapy. LVEF was 55-60% She has seen Dr. Johney Frame annually, most recently via tele health June 2020, doing well, no changes were made, planned for annual APP visit.  I saw her 07/14/2019 She is accompanied by her son today.  She is in a wheelchair, but reports that she is able to ambulates some.  She has bad OA of her hips and knees that is why she uses a wheelchair.  She says she can stand long enough to cook, wash up, able to do her ADLs independently.  She denies any palpitations or awareness of any PVCs if she is having any.  She will rarely get a fleeting/momentary "pang" in the center of her chest.  maybe the last was 6 mo or so ago.  No rest SOB, no DOE at her level of exertinal.  She denies symptoms of orthopnea or PND.   No dizzy spells, near syncope or syncope. I have seen her generally annualy since then Last was Dec 2023 She is accompanied by her son. She is doing well, very limited physically with er arthritis unfortunately. No cardiac concerns, no CP, palpitations or cardiac awareness No SOB No near syncope or syncope. When first laying down  in bed feels a little dizzy for some reason. She mentions that when she is getting to the time to take her next metoprolol she feels little off, a bit like a slump in energy, she assumes that her medicine is wearing off and will take it early. Her home BP is 120's/60's No PVCs on her EKG or exam No changes made Planned to have her transition to Dr. Elberta Fortis  *** needs EP MD ONLY next *** symptoms, PVCs otherwise *** no Echo 2018 *** no monitoring 2017    Past Medical History:  Diagnosis Date   ALLERGIC RHINITIS    CARDIOMYOPATHY    Nonischemic. Admitted in 3/11 with CHF exac, due to hyperthyroidism. LHC (03/11) showed clean coronaries with EF 40%. Myoview showed EF 38%. Echo was a technically difficult study with mild to moderately decreased EF, mild LVH, mild MR. Repeat echo (9/11) with EF 55-60%, mild diastolic dysfunction (grade I).   CARPAL TUNNEL SYNDROME, LEFT, MILD    CKD (chronic kidney disease), stage III (HCC)    Congestive heart failure, unspecified    DIABETES MELLITUS, CONTROLLED    DYSLIPIDEMIA    Graves disease    HYPERTENSION    HYPERTHYROIDISM    Obesity    Osteoarthritis    esp L hip, low back   Palpitations    Premature ventricular contraction     Past Surgical History:  Procedure Laterality Date  CARDIAC CATHETERIZATION  04/19/09   CARDIAC CATHETERIZATION N/A 09/30/2015   Procedure: Left Heart Cath and Coronary Angiography;  Surgeon: Laurey Morale, MD;  Location: [facility];  Service: Cardiovascular;  Laterality: N/A;   CHOLECYSTECTOMY     remote hernia repair      Current Outpatient Medications  Medication Sig Dispense Refill   acetaminophen (TYLENOL) 500 MG tablet Take 500 mg by mouth every 6 (six) hours as needed for moderate pain.     aspirin 81 MG tablet Take 81 mg by mouth daily.     blood glucose meter kit and supplies Dispense based on patient and insurance preference. Use up to four times daily as directed. (FOR ICD-10 E10.9, E11.9).  1 each 0   carvedilol (COREG) 25 MG tablet Take 1 tablet (25 mg total) by mouth 2 (two) times daily. 90 tablet 0   CHLORPHENIRAMINE MALEATE PO Take by mouth as needed.     colchicine 0.6 MG tablet Take 0.6 mg by mouth daily as needed. As directed     diclofenac (VOLTAREN) 75 MG EC tablet Take 1 tablet (75 mg total) by mouth 2 (two) times daily. 180 tablet 3   enalapril (VASOTEC) 10 MG tablet Take 1 tablet (10 mg total) by mouth every evening. 30 tablet 0   furosemide (LASIX) 20 MG tablet Take 1 tablet (20 mg total) by mouth 3 (three) times a week. 8 tablet 0   glucose blood (FREESTYLE TEST STRIPS) test strip Use to check blood sugars twice a day 300 each 1   lovastatin (MEVACOR) 10 MG tablet Take 1 tablet (10 mg total) by mouth at bedtime. 30 tablet 0   methimazole (TAPAZOLE) 5 MG tablet Take 1 tablet by mouth 4 times a week 90 tablet 0   Multiple Vitamin (MULTIVITAMIN) tablet Take 1 tablet by mouth daily.     ondansetron (ZOFRAN) 4 MG tablet Take 1 tablet (4 mg total) by mouth every 8 (eight) hours as needed for nausea or vomiting. 20 tablet 0   traMADol (ULTRAM) 50 MG tablet Take 1-2 tablets (50-100 mg total) by mouth daily as needed (pain). 60 tablet 5   No current facility-administered medications for this visit.    Allergies:   Latex, Wool alcohol [lanolin], and Amitriptyline   Social History:  The patient  reports that she has never smoked. She has never used smokeless tobacco. She reports that she does not drink alcohol and does not use drugs.   Family History:  The patient's family history includes Asthma in an other family member; Breast cancer in her mother; Cervical cancer in her mother; Diabetes in her mother; Kidney cancer in her father; Stroke in her father; Supraventricular tachycardia in her sister.  ROS:  Please see the history of present illness.  All other systems are reviewed and otherwise negative.   PHYSICAL EXAM:  VS:  There were no vitals taken for this visit. BMI:  There is no height or weight on file to calculate BMI. Well nourished, well developed, in no acute distress  HEENT: normocephalic, atraumatic  Neck: no JVD, carotid bruits or masses Cardiac:  *** RRR; no significant murmurs, no rubs, or gallops,  no extrasystoles noted with prolonged auscultation Lungs: ***  CTA b/l, no wheezing, rhonchi or rales  Abd: soft, nontender,  she has a very large pannus MS: no deformity or atrophy Ext: *** no edema b/l Skin: warm and dry, no rash Neuro:  No gross deficits appreciated Psych: euthymic mood, full affect  EKG: done today and reviewed by myself ***  SR 68bpm , normal intervals, no PVCs  03/17/20 EKGs previously reviewed:  SR 68bpm, no PVCs SR 79bpm, 2 PVCs  05/11/2016: TTE Study Conclusions  - Left ventricle: The cavity size was normal. Systolic function was    normal. The estimated ejection fraction was in the range of 55%    to 60%. Wall motion was normal; there were no regional wall    motion abnormalities. Doppler parameters are consistent with    abnormal left ventricular relaxation (grade 1 diastolic    dysfunction). Doppler parameters are consistent with elevated    ventricular end-diastolic filling pressure. Mean left atrial    pressure appears to be normal at rest.    09/30/2015: LHC No obstructive coronary disease.  Small caliber distal LAD but similar to prior study.    Recent Labs: 01/25/2023: ALT 9; BUN 28; Creatinine, Ser 1.18; Hemoglobin 11.4; Platelets 272.0; Potassium 5.2 No hemolysis seen; Sodium 139; TSH 2.51  01/25/2023: Cholesterol 112; HDL 46.00; LDL Cholesterol 55; Total CHOL/HDL Ratio 2; Triglycerides 55.0; VLDL 11.0   CrCl cannot be calculated (Patient's most recent lab result is older than the maximum 21 days allowed.).   Wt Readings from Last 3 Encounters:  01/25/23 275 lb (124.7 kg)  01/17/22 (!) 301 lb (136.5 kg)  05/12/21 (!) 306 lb (138.8 kg)     Other studies reviewed: Additional studies/records  reviewed today include: summarized above  ASSESSMENT AND PLAN:  1. PVCs     *** No symptoms to suggest any significant burden.     *** None noted again today with prolonged auscultation  2. NICM     Recovered LVEF, suspect 2/2 hyperthyroidism     ***  3. HTN     *** Looks good, no changes          Disposition: ***  Current medicines are reviewed at length with the patient today.  The patient did not have any concerns regarding medicines  Signed, Francis Dowse, PA-C 03/25/2023 4:28 PM     [facility] [address] [place] [address] [phone] (office)  [phone] (fax)

## 2023-03-26 ENCOUNTER — Telehealth: Payer: Self-pay | Admitting: Physician Assistant

## 2023-03-26 DIAGNOSIS — I5032 Chronic diastolic (congestive) heart failure: Secondary | ICD-10-CM

## 2023-03-26 DIAGNOSIS — I493 Ventricular premature depolarization: Secondary | ICD-10-CM

## 2023-03-26 DIAGNOSIS — R0602 Shortness of breath: Secondary | ICD-10-CM

## 2023-03-26 MED ORDER — LOVASTATIN 10 MG PO TABS
10.0000 mg | ORAL_TABLET | Freq: Every day | ORAL | 0 refills | Status: DC
Start: 2023-03-26 — End: 2023-04-17

## 2023-03-26 MED ORDER — CARVEDILOL 25 MG PO TABS: 25.0000 mg | ORAL_TABLET | Freq: Two times a day (BID) | ORAL | 0 refills | Status: DC

## 2023-03-26 MED ORDER — ENALAPRIL MALEATE 10 MG PO TABS: 10.0000 mg | ORAL_TABLET | Freq: Every evening | ORAL | 0 refills | Status: DC

## 2023-03-26 NOTE — Telephone Encounter (Signed)
*  STAT* If patient is at the pharmacy, call can be transferred to refill team.   1. Which medications need to be refilled? (please list name of each medication and dose if known)  Carvedilol, Lovastatin, and Enalapril   2. Would you like to learn more about the convenience, safety, & potential cost savings by using the [facility]?    3. Are you open to using the [facility] (Type [facility]. .   4. Which pharmacy/location (including street and city if local pharmacy) is medication to be sent to? [facility]   5. Do they need a 30 day or 90 day supply? 90 days and refills

## 2023-03-28 ENCOUNTER — Ambulatory Visit: Payer: Medicare Other | Admitting: Physician Assistant

## 2023-04-17 ENCOUNTER — Other Ambulatory Visit (HOSPITAL_COMMUNITY): Payer: Self-pay

## 2023-04-17 ENCOUNTER — Other Ambulatory Visit: Payer: Self-pay

## 2023-04-17 ENCOUNTER — Telehealth: Payer: Self-pay | Admitting: Physician Assistant

## 2023-04-17 DIAGNOSIS — R0602 Shortness of breath: Secondary | ICD-10-CM

## 2023-04-17 DIAGNOSIS — I493 Ventricular premature depolarization: Secondary | ICD-10-CM

## 2023-04-17 DIAGNOSIS — I5032 Chronic diastolic (congestive) heart failure: Secondary | ICD-10-CM

## 2023-04-17 MED ORDER — LOVASTATIN 10 MG PO TABS
10.0000 mg | ORAL_TABLET | Freq: Every day | ORAL | 0 refills | Status: DC
  Filled 2023-04-17: qty 90, 90d supply, fill #0

## 2023-04-17 MED ORDER — ENALAPRIL MALEATE 10 MG PO TABS
10.0000 mg | ORAL_TABLET | Freq: Every evening | ORAL | 0 refills | Status: DC
  Filled 2023-04-17: qty 90, 90d supply, fill #0

## 2023-04-17 MED ORDER — CARVEDILOL 25 MG PO TABS
25.0000 mg | ORAL_TABLET | Freq: Two times a day (BID) | ORAL | 0 refills | Status: DC
  Filled 2023-04-17: qty 90, 45d supply, fill #0

## 2023-04-17 NOTE — Telephone Encounter (Signed)
*  STAT* If patient is at the pharmacy, call can be transferred to refill team.   1. Which medications need to be refilled? (please list name of each medication and dose if known)   carvedilol (COREG) 25 MG tablet   enalapril (VASOTEC) 10 MG tablet    lovastatin (MEVACOR) 10 MG tablet    4. Which pharmacy/location (including street and city if local pharmacy) is medication to be sent to? [facility] - [place], [place] - [address]    5. Do they need a 30 day or 90 day supply?  30  Pt r/s for 06/06/23 with Renee,PA

## 2023-04-19 ENCOUNTER — Telehealth: Payer: Self-pay | Admitting: Physician Assistant

## 2023-04-19 ENCOUNTER — Other Ambulatory Visit (HOSPITAL_COMMUNITY): Payer: Self-pay

## 2023-04-19 MED ORDER — LOVASTATIN 10 MG PO TABS
10.0000 mg | ORAL_TABLET | Freq: Every day | ORAL | 1 refills | Status: AC
Start: 2023-04-19 — End: ?

## 2023-04-19 MED ORDER — CARVEDILOL 25 MG PO TABS: 25.0000 mg | ORAL_TABLET | Freq: Two times a day (BID) | ORAL | 1 refills | Status: DC

## 2023-04-19 MED ORDER — ENALAPRIL MALEATE 10 MG PO TABS: 10.0000 mg | ORAL_TABLET | Freq: Every evening | ORAL | 1 refills | Status: DC

## 2023-04-19 MED ORDER — LOVASTATIN 10 MG PO TABS: 10.0000 mg | ORAL_TABLET | Freq: Every day | ORAL | 1 refills | Status: DC

## 2023-04-19 NOTE — Addendum Note (Signed)
 Addended by: Margaret Pyle D on: 04/19/2023 08:28 AM   Modules accepted: Orders

## 2023-04-19 NOTE — Addendum Note (Signed)
 Addended by: Margaret Pyle D on: 04/19/2023 08:31 AM   Modules accepted: Orders

## 2023-04-19 NOTE — Telephone Encounter (Signed)
 New Message:     Patient said all her medicine was sent to the wrong pharmacy.     *STAT* If patient is at the pharmacy, call can be transferred to refill team.   1. Which medications need to be refilled? (please list name of each medication and dose if known) Enalapril, Carvedilol and Lovastatin   2. Would you like to learn more about the convenience, safety, & potential cost savings by using the [facility]?   3. Are you open to using the [facility] ([facility]. .   4. Which pharmacy/location (including street and city if local pharmacy) is medication to be sent to? [facility]   5. Do they need a 30 day or 90 day supply? 90 days and refills

## 2023-04-19 NOTE — Telephone Encounter (Signed)
 Pt's medication was sent to pt's pharmacy as requested. Confirmation received.

## 2023-04-19 NOTE — Telephone Encounter (Signed)
Pt's medications were resent to the correct pharmacy as requested. Confirmation received.

## 2023-04-20 ENCOUNTER — Ambulatory Visit: Payer: Medicare Other | Admitting: Physician Assistant

## 2023-05-17 ENCOUNTER — Ambulatory Visit

## 2023-05-17 VITALS — Ht 67.0 in | Wt 275.0 lb

## 2023-05-17 DIAGNOSIS — Z0000 Encounter for general adult medical examination without abnormal findings: Secondary | ICD-10-CM

## 2023-05-17 NOTE — Patient Instructions (Addendum)
 [name] , Thank you for taking time to come for your Medicare Wellness Visit. I appreciate your ongoing commitment to your health goals. Please review the following plan we discussed and let me know if I can assist you in the future.   Referrals/Orders/Follow-Ups/Clinician Recommendations: Aim for 30 minutes of exercise or brisk walking, 6-8 glasses of water, and 5 servings of fruits and vegetables each day.  Educated and advised on getting a DEXA scan, a Mammogram and Screening Colonoscopy.  Also advised to get the Shingles and COVID vaccines at local pharmacy in 2025.   This is a list of the screening recommended for you and due dates:  Health Maintenance  Topic Date Due   COVID-19 Vaccine (1) Never done   Eye exam for diabetics  Never done   Zoster (Shingles) Vaccine (1 of 2) 09[birth year]/1968   Yearly kidney health urinalysis for diabetes  06/18/2021   Mammogram  05/16/2024*   DEXA scan (bone density measurement)  05/16/2024*   Colon Cancer Screening  05/16/2024*   Hemoglobin A1C  07/26/2023   Flu Shot  09/07/2023   Yearly kidney function blood test for diabetes  01/25/2024   Complete foot exam   01/25/2024   Medicare Annual Wellness Visit  05/16/2024   DTaP/Tdap/Td vaccine (2 - Td or Tdap) 09/20/2025   Pneumonia Vaccine  Completed   Hepatitis C Screening  Completed   HPV Vaccine  Aged Out   Meningitis B Vaccine  Aged Out  *Topic was postponed. The date shown is not the original due date.    Advanced directives: (Declined) Advance directive discussed with you today. Even though you declined this today, please call our office should you change your mind, and we can give you the proper paperwork for you to fill out.  Next Medicare Annual Wellness Visit scheduled for next year: Yes   Managing Pain Without Opioids Opioids are strong medicines used to treat moderate to severe pain. For some people, especially those who have long-term (chronic) pain, opioids may not be the best choice for  pain management due to: Side effects like nausea, constipation, and sleepiness. The risk of addiction (opioid use disorder). The longer you take opioids, the greater your risk of addiction. Pain that lasts for more than 3 months is called chronic pain. Managing chronic pain usually requires more than one approach and is often provided by a team of health care providers working together (multidisciplinary approach). Pain management may be done at a pain management center or pain clinic. How to manage pain without the use of opioids Use non-opioid medicines Non-opioid medicines for pain may include: Over-the-counter or prescription non-steroidal anti-inflammatory drugs (NSAIDs). These may be the first medicines used for pain. They work well for muscle and bone pain, and they reduce swelling. Acetaminophen. This over-the-counter medicine may work well for milder pain but not swelling. Antidepressants. These may be used to treat chronic pain. A certain type of antidepressant (tricyclics) is often used. These medicines are given in lower doses for pain than when used for depression. Anticonvulsants. These are usually used to treat seizures but may also reduce nerve (neuropathic) pain. Muscle relaxants. These relieve pain caused by sudden muscle tightening (spasms). You may also use a pain medicine that is applied to the skin as a patch, cream, or gel (topical analgesic), such as a numbing medicine. These may cause fewer side effects than medicines taken by mouth. Do certain therapies as directed Some therapies can help with pain management. They include: Physical  therapy. You will do exercises to gain strength and flexibility. A physical therapist may teach you exercises to move and stretch parts of your body that are weak, stiff, or painful. You can learn these exercises at physical therapy visits and practice them at home. Physical therapy may also involve: Massage. Heat wraps or applying heat or cold  to affected areas. Electrical signals that interrupt pain signals (transcutaneous electrical nerve stimulation, TENS). Weak lasers that reduce pain and swelling (low-level laser therapy). Signals from your body that help you learn to regulate pain (biofeedback). Occupational therapy. This helps you to learn ways to function at home and work with less pain. Recreational therapy. This involves trying new activities or hobbies, such as a physical activity or drawing. Mental health therapy, including: Cognitive behavioral therapy (CBT). This helps you learn coping skills for dealing with pain. Acceptance and commitment therapy (ACT) to change the way you think and react to pain. Relaxation therapies, including muscle relaxation exercises and mindfulness-based stress reduction. Pain management counseling. This may be individual, family, or group counseling.  Receive medical treatments Medical treatments for pain management include: Nerve block injections. These may include a pain blocker and anti-inflammatory medicines. You may have injections: Near the spine to relieve chronic back or neck pain. Into joints to relieve back or joint pain. Into nerve areas that supply a painful area to relieve body pain. Into muscles (trigger point injections) to relieve some painful muscle conditions. A medical device placed near your spine to help block pain signals and relieve nerve pain or chronic back pain (spinal cord stimulation device). Acupuncture. Follow these instructions at home Medicines Take over-the-counter and prescription medicines only as told by your health care provider. If you are taking pain medicine, ask your health care providers about possible side effects to watch out for. Do not drive or use heavy machinery while taking prescription opioid pain medicine. Lifestyle  Do not use drugs or alcohol to reduce pain. If you drink alcohol, limit how much you have to: 0-1 drink a day for women  who are not pregnant. 0-2 drinks a day for men. Know how much alcohol is in a drink. In the [place] one drink equals one 12 oz bottle of beer (355 mL), one 5 oz glass of wine (148 mL), or one 1 oz glass of hard liquor (44 mL). Do not use any products that contain nicotine or tobacco. These products include cigarettes, chewing tobacco, and vaping devices, such as e-cigarettes. If you need help quitting, ask your health care provider. Eat a healthy diet and maintain a healthy weight. Poor diet and excess weight may make pain worse. Eat foods that are high in fiber. These include fresh fruits and vegetables, whole grains, and beans. Limit foods that are high in fat and processed sugars, such as fried and sweet foods. Exercise regularly. Exercise lowers stress and may help relieve pain. Ask your health care provider what activities and exercises are safe for you. If your health care provider approves, join an exercise class that combines movement and stress reduction. Examples include yoga and tai chi. Get enough sleep. Lack of sleep may make pain worse. Lower stress as much as possible. Practice stress reduction techniques as told by your therapist. General instructions Work with all your pain management providers to find the treatments that work best for you. You are an important member of your pain management team. There are many things you can do to reduce pain on your own. Consider joining an  online or in-person support group for people who have chronic pain. Keep all follow-up visits. This is important. Where to find more information You can find more information about managing pain without opioids from: American Academy of Pain Medicine: painmed.org Institute for Chronic Pain: instituteforchronicpain.org American Chronic Pain Association: theacpa.org Contact a health care provider if: You have side effects from pain medicine. Your pain gets worse or does not get better with treatments or  home therapy. You are struggling with anxiety or depression. Summary Many types of pain can be managed without opioids. Chronic pain may respond better to pain management without opioids. Pain is best managed when you and a team of health care providers work together. Pain management without opioids may include non-opioid medicines, medical treatments, physical therapy, mental health therapy, and lifestyle changes. Tell your health care providers if your pain gets worse or is not being managed well enough. This information is not intended to replace advice given to you by your health care provider. Make sure you discuss any questions you have with your health care provider. Document Revised: 05/05/2020 Document Reviewed: 05/05/2020 Elsevier Patient Education  2024 [employer].

## 2023-05-17 NOTE — Progress Notes (Signed)
 Subjective:   Katie Higgins is a 73 y.o. who presents for a Medicare Wellness preventive visit.  Visit Complete: Virtual I connected with  Katie Higgins on 05/17/23 by a audio enabled telemedicine application and verified that I am speaking with the correct person using two identifiers.  Patient Location: Home  Provider Location: Office/Clinic  I discussed the limitations of evaluation and management by telemedicine. The patient expressed understanding and agreed to proceed.  Vital Signs: Because this visit was a virtual/telehealth visit, some criteria may be missing or patient reported. Any vitals not documented were not able to be obtained and vitals that have been documented are patient reported.  VideoDeclined- This patient declined Librarian, academic. Therefore the visit was completed with audio only.  Persons Participating in Visit: Patient.  AWV Questionnaire: No: Patient Medicare AWV questionnaire was not completed prior to this visit.  Cardiac Risk Factors include: advanced age (>61men, >32 women);diabetes mellitus;dyslipidemia;hypertension;obesity (BMI >30kg/m2)     Objective:    Today's Vitals   05/17/23 1543  Weight: 275 lb (124.7 kg)  Height: 5\' 7"  (1.702 m)  PainSc: 3   PainLoc: Hip   Body mass index is 43.07 kg/m.     05/17/2023    3:42 PM 11/23/2021    1:22 PM 11/10/2020    4:06 PM 09/30/2015    9:07 AM 05/20/2015    1:06 AM  Advanced Directives  Does Patient Have a Medical Advance Directive? No No No No No  Would patient like information on creating a medical advance directive? No - Patient declined No - Patient declined No - Patient declined No - patient declined information No - patient declined information    Current Medications (verified) Outpatient Encounter Medications as of 05/17/2023  Medication Sig   acetaminophen (TYLENOL) 500 MG tablet Take 500 mg by mouth every 6 (six) hours as needed for moderate pain.    aspirin 81 MG tablet Take 81 mg by mouth daily.   blood glucose meter kit and supplies Dispense based on patient and insurance preference. Use up to four times daily as directed. (FOR ICD-10 E10.9, E11.9).   carvedilol (COREG) 25 MG tablet Take 1 tablet (25 mg total) by mouth 2 (two) times daily.   CHLORPHENIRAMINE MALEATE PO Take by mouth as needed.   colchicine 0.6 MG tablet Take 0.6 mg by mouth daily as needed. As directed   diclofenac (VOLTAREN) 75 MG EC tablet Take 1 tablet (75 mg total) by mouth 2 (two) times daily.   enalapril (VASOTEC) 10 MG tablet Take 1 tablet (10 mg total) by mouth every evening.   furosemide (LASIX) 20 MG tablet Take 1 tablet (20 mg total) by mouth 3 (three) times a week.   glucose blood (FREESTYLE TEST STRIPS) test strip Use to check blood sugars twice a day   lovastatin (MEVACOR) 10 MG tablet Take 1 tablet (10 mg total) by mouth at bedtime.   methimazole (TAPAZOLE) 5 MG tablet Take 1 tablet by mouth 4 times a week   Multiple Vitamin (MULTIVITAMIN) tablet Take 1 tablet by mouth daily.   ondansetron (ZOFRAN) 4 MG tablet Take 1 tablet (4 mg total) by mouth every 8 (eight) hours as needed for nausea or vomiting.   traMADol (ULTRAM) 50 MG tablet Take 1-2 tablets (50-100 mg total) by mouth daily as needed (pain).   [DISCONTINUED] cyclobenzaprine (FLEXERIL) 5 MG tablet Take 1 tablet (5 mg total) by mouth 3 (three) times daily as needed for muscle spasms.  No facility-administered encounter medications on file as of 05/17/2023.    Allergies (verified) Latex, Wool alcohol [lanolin], and Amitriptyline   History: Past Medical History:  Diagnosis Date   ALLERGIC RHINITIS    CARDIOMYOPATHY    Nonischemic. Admitted in 3/11 with CHF exac, due to hyperthyroidism. LHC (03/11) showed clean coronaries with EF 40%. Myoview showed EF 38%. Echo was a technically difficult study with mild to moderately decreased EF, mild LVH, mild MR. Repeat echo (9/11) with EF 55-60%, mild  diastolic dysfunction (grade I).   CARPAL TUNNEL SYNDROME, LEFT, MILD    CKD (chronic kidney disease), stage III (HCC)    Congestive heart failure, unspecified    DIABETES MELLITUS, CONTROLLED    DYSLIPIDEMIA    Graves disease    HYPERTENSION    HYPERTHYROIDISM    Obesity    Osteoarthritis    esp L hip, low back   Palpitations    Premature ventricular contraction    Past Surgical History:  Procedure Laterality Date   CARDIAC CATHETERIZATION  04/19/09   CARDIAC CATHETERIZATION N/A 09/30/2015   Procedure: Left Heart Cath and Coronary Angiography;  Surgeon: Laurey Morale, MD;  Location: [facility] INVASIVE CV LAB;  Service: Cardiovascular;  Laterality: N/A;   CHOLECYSTECTOMY     remote hernia repair     Family History  Problem Relation Age of Onset   Diabetes Mother    Breast cancer Mother    Cervical cancer Mother    Kidney cancer Father        Kidney Cancer   Stroke Father        CVA   Supraventricular tachycardia Sister    Asthma Other        family history   Social History   Socioeconomic History   Marital status: Widowed    Spouse name: Not on file   Number of children: Not on file   Years of education: Not on file   Highest education level: Not on file  Occupational History   Not on file  Tobacco Use   Smoking status: Never    Passive exposure: Never   Smokeless tobacco: Never  Vaping Use   Vaping status: Never Used  Substance and Sexual Activity   Alcohol use: No   Drug use: No   Sexual activity: Not on file  Other Topics Concern   Not on file  Social History Narrative   Lives in [place] with her son.   Widowed since 1992.   She is very sedentary, uses a wheelchair when out of the house.   Ambulatory in home with cane.   Social Drivers of Corporate investment banker Strain: Low Risk  (05/17/2023)   Overall Financial Resource Strain (CARDIA)    Difficulty of Paying Living Expenses: Not hard at all  Food Insecurity: No Food Insecurity (05/17/2023)    Hunger Vital Sign    Worried About Running Out of Food in the Last Year: Never true    Ran Out of Food in the Last Year: Never true  Transportation Needs: No Transportation Needs (05/17/2023)   PRAPARE - Administrator, Civil Service (Medical): No    Lack of Transportation (Non-Medical): No  Physical Activity: Insufficiently Active (05/17/2023)   Exercise Vital Sign    Days of Exercise per Week: 5 days    Minutes of Exercise per Session: 10 min  Stress: No Stress Concern Present (05/17/2023)   [employer] of Occupational Health - Occupational Stress Questionnaire  Feeling of Stress : Not at all  Social Connections: Socially Isolated (05/17/2023)   Social Connection and Isolation Panel [NHANES]    Frequency of Communication with Friends and Family: More than three times a week    Frequency of Social Gatherings with Friends and Family: Once a week    Attends Religious Services: Never    Database administrator or Organizations: No    Attends Banker Meetings: Never    Marital Status: Widowed    Tobacco Counseling Counseling given: No    Clinical Intake:  Pre-visit preparation completed: Yes  Pain : 0-10 Pain Score: 3  Pain Type: Acute pain Pain Location: Hip Pain Orientation: Left Pain Descriptors / Indicators: Aching Pain Onset: In the past 7 days Pain Frequency: Occasional Effect of Pain on Daily Activities: stretching     BMI - recorded: 43.07 Nutritional Status: BMI > 30  Obese Nutritional Risks: Other (Comment) Diabetes: Yes CBG done?: No Did pt. bring in CBG monitor from home?: No  Lab Results  Component Value Date   HGBA1C 6.0 01/25/2023   HGBA1C 6.0 11/07/2021   HGBA1C 6.1 (A) 05/12/2021     How often do you need to have someone help you when you read instructions, pamphlets, or other written materials from your doctor or pharmacy?: 1 - Never  Interpreter Needed?: No  Information entered by :: Hassell Halim,  CMA   Activities of Daily Living     05/17/2023    3:48 PM  In your present state of health, do you have any difficulty performing the following activities:  Hearing? 0  Vision? 0  Difficulty concentrating or making decisions? 0  Walking or climbing stairs? 0  Comment use walker for balance - no difficulty walking/climbing  Dressing or bathing? 0  Doing errands, shopping? 0  Preparing Food and eating ? N  Using the Toilet? N  In the past six months, have you accidently leaked urine? N  Do you have problems with loss of bowel control? N  Managing your Medications? N  Managing your Finances? N  Housekeeping or managing your Housekeeping? N    Patient Care Team: Myrlene Broker, MD as PCP - General (Internal Medicine) Romero Belling, MD (Inactive) as Consulting Physician (Endocrinology) Laurey Morale, MD as Consulting Physician (Cardiology) Judi Saa, DO (Sports Medicine) Ander Purpura, OD as Consulting Physician (Optometry)  Indicate any recent Medical Services you may have received from other than [facility] providers in the past year (date may be approximate).     Assessment:   This is a routine wellness examination for [place].  Hearing/Vision screen Hearing Screening - Comments:: Denies hearing difficulties   Vision Screening - Comments:: Wears rx glasses - up to date with routine eye exams with Opthalmologist   Goals Addressed               This Visit's Progress     Patient Stated (pt-stated)        Patient stated she plans to stay active.       Depression Screen     05/17/2023    3:56 PM 01/25/2023    9:46 AM 11/23/2021    1:24 PM 11/07/2021    2:24 PM 11/10/2020    4:04 PM 07/14/2019   12:57 PM 10/31/2016    3:38 PM  PHQ 2/9 Scores  PHQ - 2 Score 0 0 0 0 0 0 1  PHQ- 9 Score 0 0 0 0  Fall Risk     05/17/2023    3:50 PM 01/25/2023    9:46 AM 11/23/2021    1:24 PM 11/07/2021    2:24 PM 11/10/2020    4:09 PM  Fall Risk   Falls in the  past year? 0 1 0 0 0  Number falls in past yr: 0 0 0 0 0  Injury with Fall? 0 0 0 0 0  Risk for fall due to : No Fall Risks  Impaired vision;Impaired mobility  No Fall Risks  Follow up Falls prevention discussed;Falls evaluation completed Falls evaluation completed Falls prevention discussed  Falls evaluation completed    MEDICARE RISK AT HOME:  Medicare Risk at Home Any stairs in or around the home?: Yes (outside) If so, are there any without handrails?: No Home free of loose throw rugs in walkways, pet beds, electrical cords, etc?: Yes Adequate lighting in your home to reduce risk of falls?: Yes Life alert?: No Use of a cane, walker or w/c?: Yes (cane/walker/wheelchair) Grab bars in the bathroom?: No Shower chair or bench in shower?: No Elevated toilet seat or a handicapped toilet?: No  TIMED UP AND GO:  Was the test performed?  No  Cognitive Function: 6CIT completed        05/17/2023    3:51 PM 11/23/2021    1:27 PM  6CIT Screen  What Year? 0 points 0 points  What month? 0 points 0 points  What time? 0 points 0 points  Count back from 20 0 points 0 points  Months in reverse 0 points 0 points  Repeat phrase 0 points 0 points  Total Score 0 points 0 points    Immunizations Immunization History  Administered Date(s) Administered   Fluad Quad(high Dose 65+) 11/14/2018, 01/22/2020, 12/28/2020, 11/07/2021   Fluad Trivalent(High Dose 65+) 01/25/2023   Influenza Split 01/09/2011, 11/15/2011   Influenza Whole 09/22/2009, 07/07/2010   Influenza, High Dose Seasonal PF 10/08/2015, 11/15/2017   Influenza,inj,Quad PF,6+ Mos 11/20/2012, 12/24/2013   Influenza-Unspecified 11/07/2014   Pneumococcal Conjugate-13 10/31/2016   Pneumococcal Polysaccharide-23 02/04/2014, 05/21/2015   Tdap 09/21/2015   Zoster, Live 09/08/2014    Screening Tests Health Maintenance  Topic Date Due   COVID-19 Vaccine (1) Never done   OPHTHALMOLOGY EXAM  Never done   Zoster Vaccines- Shingrix (1  of 2) 09/24/1968   Diabetic kidney evaluation - Urine ACR  06/18/2021   MAMMOGRAM  05/16/2024 (Originally 09/25/1999)   DEXA SCAN  05/16/2024 (Originally 09/25/2014)   Colonoscopy  05/16/2024 (Originally 09/25/1994)   HEMOGLOBIN A1C  07/26/2023   INFLUENZA VACCINE  09/07/2023   Diabetic kidney evaluation - eGFR measurement  01/25/2024   FOOT EXAM  01/25/2024   Medicare Annual Wellness (AWV)  05/16/2024   DTaP/Tdap/Td (2 - Td or Tdap) 09/20/2025   Pneumonia Vaccine 88+ Years old  Completed   Hepatitis C Screening  Completed   HPV VACCINES  Aged Out   Meningococcal B Vaccine  Aged Out    Health Maintenance  Health Maintenance Due  Topic Date Due   COVID-19 Vaccine (1) Never done   OPHTHALMOLOGY EXAM  Never done   Zoster Vaccines- Shingrix (1 of 2) 09/24/1968   Diabetic kidney evaluation - Urine ACR  06/18/2021   Health Maintenance Items Addressed:05/17/2023 Pt declines to have the Shingles and CoVID vaccines.  Pt also declines to have a DEXA, a Colonoscopy and Mammogram.  Additional Screening:  Vision Screening: Recommended annual ophthalmology exams for early detection of glaucoma and other disorders of  the eye.  Pt declines to schedule an eye exam w/Dr Ander Purpura at [facility]   Dental Screening: Recommended annual dental exams for proper oral hygiene  Community Resource Referral / Chronic Care Management: CRR required this visit?  No   CCM required this visit?  No     Plan:     I have personally reviewed and noted the following in the patient's chart:   Medical and social history Use of alcohol, tobacco or illicit drugs  Current medications and supplements including opioid prescriptions. Patient is currently taking opioid prescriptions. Information provided to patient regarding non-opioid alternatives. Patient advised to discuss non-opioid treatment plan with their provider. Functional ability and status Nutritional status Physical activity Advanced  directives List of other physicians Hospitalizations, surgeries, and ER visits in previous 12 months Vitals Screenings to include cognitive, depression, and falls Referrals and appointments  In addition, I have reviewed and discussed with patient certain preventive protocols, quality metrics, and best practice recommendations. A written personalized care plan for preventive services as well as general preventive health recommendations were provided to patient.     Darreld Mclean, CMA   05/17/2023   After Visit Summary: (MyChart) Due to this being a telephonic visit, the after visit summary with patients personalized plan was offered to patient via MyChart   Notes: Please refer to Routing Comments.

## 2023-05-21 ENCOUNTER — Other Ambulatory Visit: Payer: Self-pay

## 2023-05-21 DIAGNOSIS — I5032 Chronic diastolic (congestive) heart failure: Secondary | ICD-10-CM

## 2023-05-21 DIAGNOSIS — R0602 Shortness of breath: Secondary | ICD-10-CM

## 2023-05-21 MED ORDER — CARVEDILOL 25 MG PO TABS: 25.0000 mg | ORAL_TABLET | Freq: Two times a day (BID) | ORAL | 0 refills | Status: DC

## 2023-06-05 NOTE — Progress Notes (Deleted)
 Cardiology Office Note Date:  06/05/2023  Patient ID:  Katie Higgins September 10, 1950, MRN [id number] PCP:  Adelia Homestead, MD  Cardiologist:  Dr. Mitzie Anda (2017) EP: Dr. Nunzio Belch Endo: Dr. Washington Hacker    Chief Complaint:   *** annual visit  History of Present Illness: Katie Higgins is a 73 y.o. female with history of  Hyperthyroidism (w/thyrotoxicosis, suspected Grave's), DM, HLD, HTN, CKD (III), morbid obesity. PVCs 2011 w/u noted CM > LHC w/normal coronaries.   CM suspect 2/2 hyperthyroidism, and did improve with therapy.    Seeing Dr. Nunzio Belch and EP APP over the years  I saw her 07/14/2019 She is accompanied by her son today.  She is in a wheelchair, but reports that she is able to ambulates some.  She has bad OA of her hips and knees that is why she uses a wheelchair.  She says she can stand long enough to cook, wash up, able to do her ADLs independently.  She denies any palpitations or awareness of any PVCs if she is having any.  She will rarely get a fleeting/momentary "pang" in the center of her chest.  maybe the last was 6 mo or so ago.  No rest SOB, no DOE at her level of exertinal.  She denies symptoms of orthopnea or PND.   No dizzy spells, near syncope or syncope.  She has noticed swelling in her feet for the last 3 weeks, it is nearly if not completely resolved in the AM, but then some days uncomfortably swollen.  She notes this is associated with an aching discomfort to the very lateral edges of her feet, like she has been walking on the edge of her feet, or has a "rock bruise"  Edema sounded more of a dependent edema, Recommend she take her lasix  QD for 3 days then resume 3d/week, discussed minimizing sodium and elevating feet when seated.  If her lasix  requirements remain increased, will update her echo, she is alsked to let us  know. No PVCs on her EKG.  01/12/20 with me She is accompanied by her son again today She lives in her own home, essentially wheelchair bound  for ambulating 2/2 her OA and terrible knee pain, but once in the kitchen stands to cook, do dishes, etc  Able to care for herself. She denies any CP, palpitations or SOB, no cardiac awareness. She denies any symptoms of PND or orthopnea. Tolerating her medicines well No recurrent edema that she noticed Exam was without extrasystoles, no symptoms or exam findings to suggest volume OL or cardiac changes.  Planned for 50mo visit > 1 year if she remained clinically stable cardiac-wise.  I saw her 11/24/20 Again, is accompanied by her son. She is doing well.  Had COVID early this year though did OK. 49mo ago she had a single episode of a fleeting/momentary flutter in her heart beat.  None otherwise with no cardiac awareness She will have a sense of chest heaviness/pulling when not wearing a bra particularly L breast/sided, that is resolved with wearing a bra and knows this is not her heart. No enar syncope or syncope No edema No changes in her baseline capacity to take care of her self, does self care well, able to get into and out of the chair by her self, just can not ambulate any real distance 2/2 severe OA of her hips/knees Her endocrinologist released her to follow thyroid  with her PMD, she also manages her lipids.  Felt to be doing well,  no symptoms or exam findings to suggest any clinical changes, not felt to need a new echo.  Planned for an annual visit  I saw her 01/2022 She is accompanied by her son. She is doing well, very limited physically with er arthritis unfortunately. No cardiac concerns, no CP, palpitations or cardiac awareness No SOB No near syncope or syncope. When first laying down in bed feels a little dizzy for some reason. She mentions that when she is getting to the time to take her next metoprolol she feels little off, a bit like a slump in energy, she assumes that her medicine is wearing off and will take it early. Her home BP is 120's/60's No changes made, no  PVCs  TODAY  *** PVCs *** symptoms  Past Medical History:  Diagnosis Date   ALLERGIC RHINITIS    CARDIOMYOPATHY    Nonischemic. Admitted in 3/11 with CHF exac, due to hyperthyroidism. LHC (03/11) showed clean coronaries with EF 40%. Myoview  showed EF 38%. Echo was a technically difficult study with mild to moderately decreased EF, mild LVH, mild MR. Repeat echo (9/11) with EF 55-60%, mild diastolic dysfunction (grade I).   CARPAL TUNNEL SYNDROME, LEFT, MILD    CKD (chronic kidney disease), stage III (HCC)    Congestive heart failure, unspecified    DIABETES MELLITUS, CONTROLLED    DYSLIPIDEMIA    Graves disease    HYPERTENSION    HYPERTHYROIDISM    Obesity    Osteoarthritis    esp L hip, low back   Palpitations    Premature ventricular contraction     Past Surgical History:  Procedure Laterality Date   CARDIAC CATHETERIZATION  04/19/09   CARDIAC CATHETERIZATION N/A 09/30/2015   Procedure: Left Heart Cath and Coronary Angiography;  Surgeon: Darlis Eisenmenger, MD;  Location: [facility] INVASIVE CV LAB;  Service: Cardiovascular;  Laterality: N/A;   CHOLECYSTECTOMY     remote hernia repair      Current Outpatient Medications  Medication Sig Dispense Refill   acetaminophen  (TYLENOL ) 500 MG tablet Take 500 mg by mouth every 6 (six) hours as needed for moderate pain.     aspirin  81 MG tablet Take 81 mg by mouth daily.     blood glucose meter kit and supplies Dispense based on patient and insurance preference. Use up to four times daily as directed. (FOR ICD-10 E10.9, E11.9). 1 each 0   carvedilol  (COREG ) 25 MG tablet Take 1 tablet (25 mg total) by mouth 2 (two) times daily. 60 tablet 0   CHLORPHENIRAMINE MALEATE PO Take by mouth as needed.     colchicine  0.6 MG tablet Take 0.6 mg by mouth daily as needed. As directed     diclofenac  (VOLTAREN ) 75 MG EC tablet Take 1 tablet (75 mg total) by mouth 2 (two) times daily. 180 tablet 3   enalapril  (VASOTEC ) 10 MG tablet Take 1 tablet (10 mg total)  by mouth every evening. 30 tablet 1   furosemide  (LASIX ) 20 MG tablet Take 1 tablet (20 mg total) by mouth 3 (three) times a week. 8 tablet 0   glucose blood (FREESTYLE TEST STRIPS) test strip Use to check blood sugars twice a day 300 each 1   lovastatin  (MEVACOR ) 10 MG tablet Take 1 tablet (10 mg total) by mouth at bedtime. 30 tablet 1   methimazole  (TAPAZOLE ) 5 MG tablet Take 1 tablet by mouth 4 times a week 90 tablet 0   Multiple Vitamin (MULTIVITAMIN) tablet Take 1 tablet by mouth daily.  ondansetron  (ZOFRAN ) 4 MG tablet Take 1 tablet (4 mg total) by mouth every 8 (eight) hours as needed for nausea or vomiting. 20 tablet 0   traMADol  (ULTRAM ) 50 MG tablet Take 1-2 tablets (50-100 mg total) by mouth daily as needed (pain). 60 tablet 5   No current facility-administered medications for this visit.    Allergies:   Latex, Wool alcohol [lanolin], and Amitriptyline    Social History:  The patient  reports that she has never smoked. She has never been exposed to tobacco smoke. She has never used smokeless tobacco. She reports that she does not drink alcohol and does not use drugs.   Family History:  The patient's family history includes Asthma in an other family member; Breast cancer in her mother; Cervical cancer in her mother; Diabetes in her mother; Kidney cancer in her father; Stroke in her father; Supraventricular tachycardia in her sister.  ROS:  Please see the history of present illness.  All other systems are reviewed and otherwise negative.   PHYSICAL EXAM:  VS:  There were no vitals taken for this visit. BMI: There is no height or weight on file to calculate BMI. Well nourished, well developed, in no acute distress  HEENT: normocephalic, atraumatic  Neck: no JVD, carotid bruits or masses Cardiac: *** RRR; no significant murmurs, no rubs, or gallops,  no extrasystoles noted with prolonged auscultation Lungs: ***  CTA b/l, no wheezing, rhonchi or rales  Abd: soft, nontender,  she  has a very large pannus MS: no deformity or atrophy Ext:  *** no edema b/l Skin: warm and dry, no rash Neuro:  No gross deficits appreciated Psych: euthymic mood, full affect   EKG: done today and reviewed by myself ***  SR 68bpm , normal intervals, no PVCs  03/17/20 EKGs previously reviewed:  SR 68bpm, no PVCs SR 79bpm, 2 PVCs  05/11/2016: TTE Study Conclusions  - Left ventricle: The cavity size was normal. Systolic function was    normal. The estimated ejection fraction was in the range of 55%    to 60%. Wall motion was normal; there were no regional wall    motion abnormalities. Doppler parameters are consistent with    abnormal left ventricular relaxation (grade 1 diastolic    dysfunction). Doppler parameters are consistent with elevated    ventricular end-diastolic filling pressure. Mean left atrial    pressure appears to be normal at rest.    09/30/2015: LHC No obstructive coronary disease.  Small caliber distal LAD but similar to prior study.    Recent Labs: 01/25/2023: ALT 9; BUN 28; Creatinine, Ser 1.18; Hemoglobin 11.4; Platelets 272.0; Potassium 5.2 No hemolysis seen; Sodium 139; TSH 2.51  01/25/2023: Cholesterol 112; HDL 46.00; LDL Cholesterol 55; Total CHOL/HDL Ratio 2; Triglycerides 55.0; VLDL 11.0   CrCl cannot be calculated (Patient's most recent lab result is older than the maximum 21 days allowed.).   Wt Readings from Last 3 Encounters:  05/17/23 275 lb (124.7 kg)  01/25/23 275 lb (124.7 kg)  01/17/22 (!) 301 lb (136.5 kg)     Other studies reviewed: Additional studies/records reviewed today include: summarized above  ASSESSMENT AND PLAN:  1. PVCs     *** No symptoms to suggest any significant burden.     *** None noted again today with prolonged auscultation  2. NICM     Recovered LVEF, suspect 2/2 hyperthyroidism     ***  3. HTN     *** Looks good, no changes    Disposition: ***  sooner if needed   Current medicines are reviewed at length  with the patient today.  The patient did not have any concerns regarding medicines  Signed, Mertha Abrahams, PA-C 06/05/2023 12:47 PM     [facility] [address] [place] [address] [phone] (office)  [phone] (fax)

## 2023-06-06 ENCOUNTER — Ambulatory Visit: Admitting: Physician Assistant

## 2023-06-06 ENCOUNTER — Telehealth: Payer: Self-pay | Admitting: Physician Assistant

## 2023-06-06 DIAGNOSIS — I5032 Chronic diastolic (congestive) heart failure: Secondary | ICD-10-CM

## 2023-06-06 DIAGNOSIS — R0602 Shortness of breath: Secondary | ICD-10-CM

## 2023-06-06 MED ORDER — CARVEDILOL 25 MG PO TABS: 25.0000 mg | ORAL_TABLET | Freq: Two times a day (BID) | ORAL | 1 refills | Status: DC

## 2023-06-06 NOTE — Telephone Encounter (Signed)
*  STAT* If patient is at the pharmacy, call can be transferred to refill team.   1. Which medications need to be refilled? (please list name of each medication and dose if known)   carvedilol  (COREG ) 25 MG tablet   2. Would you like to learn more about the convenience, safety, & potential cost savings by using the [facility]?   3. Are you open to using the [facility] (Type [facility]. ).  4. Which pharmacy/location (including street and city if local pharmacy) is medication to be sent to?  [facility] [address] [place] - [address]   5. Do they need a 30 day or 90 day supply?   30 day  Patient is almost out of this medication.  Patient has appointment scheduled on 6/19 with R. Caralyn Chandler.

## 2023-06-06 NOTE — Telephone Encounter (Signed)
 Pt's medication was sent to pt's pharmacy as requested. Confirmation received.

## 2023-07-25 ENCOUNTER — Telehealth: Payer: Self-pay | Admitting: Physician Assistant

## 2023-07-25 DIAGNOSIS — R0602 Shortness of breath: Secondary | ICD-10-CM

## 2023-07-25 DIAGNOSIS — I5032 Chronic diastolic (congestive) heart failure: Secondary | ICD-10-CM

## 2023-07-25 DIAGNOSIS — I493 Ventricular premature depolarization: Secondary | ICD-10-CM

## 2023-07-25 MED ORDER — ENALAPRIL MALEATE 10 MG PO TABS: 10.0000 mg | ORAL_TABLET | Freq: Every evening | ORAL | 0 refills | Status: DC

## 2023-07-25 MED ORDER — LOVASTATIN 10 MG PO TABS: 10.0000 mg | ORAL_TABLET | Freq: Every day | ORAL | 0 refills | Status: DC

## 2023-07-25 MED ORDER — CARVEDILOL 25 MG PO TABS: 25.0000 mg | ORAL_TABLET | Freq: Two times a day (BID) | ORAL | 0 refills | Status: DC

## 2023-07-25 NOTE — Telephone Encounter (Signed)
*  STAT* If patient is at the pharmacy, call can be transferred to refill team.   1. Which medications need to be refilled? (please list name of each medication and dose if known) Enalapril ,  Carvedilol  and Lovastatin    2. Would you like to learn more about the convenience, safety, & potential cost savings by using the [facility]?   3. Are you open to using the [facility] ([facility].   4. Which pharmacy/location (including street and city if local pharmacy) is medication to be sent to?[facility]   5. Do they need a 30 day or 90 day supply? 90 days and refills- her next appointment is 09-07-23

## 2023-07-25 NOTE — Telephone Encounter (Signed)
 Refills has been sent to the pharmacy.

## 2023-07-26 ENCOUNTER — Ambulatory Visit: Admitting: Physician Assistant

## 2023-07-30 ENCOUNTER — Ambulatory Visit: Payer: Medicare Other | Admitting: Internal Medicine

## 2023-08-03 ENCOUNTER — Encounter: Payer: Self-pay | Admitting: Internal Medicine

## 2023-08-03 DIAGNOSIS — M1712 Unilateral primary osteoarthritis, left knee: Secondary | ICD-10-CM

## 2023-08-03 DIAGNOSIS — M1711 Unilateral primary osteoarthritis, right knee: Secondary | ICD-10-CM

## 2023-08-03 MED ORDER — TRAMADOL HCL 50 MG PO TABS: 50.0000 mg | ORAL_TABLET | Freq: Every day | ORAL | 5 refills | Status: DC | PRN

## 2023-08-03 NOTE — Telephone Encounter (Signed)
 Please send in refill to [facility]

## 2023-08-08 ENCOUNTER — Telehealth: Payer: Self-pay | Admitting: Physician Assistant

## 2023-08-08 DIAGNOSIS — I493 Ventricular premature depolarization: Secondary | ICD-10-CM

## 2023-08-08 DIAGNOSIS — I5032 Chronic diastolic (congestive) heart failure: Secondary | ICD-10-CM

## 2023-08-08 MED ORDER — ENALAPRIL MALEATE 10 MG PO TABS: 10.0000 mg | ORAL_TABLET | Freq: Every evening | ORAL | 0 refills | Status: DC

## 2023-08-08 NOTE — Telephone Encounter (Signed)
 Pt's medication was sent to pt's pharmacy as requested. Confirmation received.

## 2023-08-08 NOTE — Telephone Encounter (Signed)
*  STAT* If patient is at the pharmacy, call can be transferred to refill team.   1. Which medications need to be refilled? (please list name of each medication and dose if known) enalapril  (VASOTEC ) 10 MG tablet   2. Which pharmacy/location (including street and city if local pharmacy) is medication to be sent to? [facility] [address] [place] - [address] Phone: [phone]  Fax: [phone]   3. Do they need a 30 day or 90 day supply? 90 Pt has not been seen since 12/23. She has cx her last 7 Appts. I explained to her that we cannot keep refilling her medication without seeing her.  I offered her an appt for 7/23 with B Ollis and pt refused, she wanted it documented in her chair that we are refusing to give her refills. I told her that I would also be documenting our conversation and that I offered her an earlier appointment to get her refills and she refused. She hung up mad.

## 2023-08-10 ENCOUNTER — Encounter: Payer: Self-pay | Admitting: Internal Medicine

## 2023-08-13 ENCOUNTER — Ambulatory Visit: Admitting: Internal Medicine

## 2023-09-05 ENCOUNTER — Other Ambulatory Visit: Payer: Self-pay | Admitting: Internal Medicine

## 2023-09-05 DIAGNOSIS — Z760 Encounter for issue of repeat prescription: Secondary | ICD-10-CM

## 2023-09-05 MED ORDER — DICLOFENAC SODIUM 75 MG PO TBEC: 75.0000 mg | DELAYED_RELEASE_TABLET | Freq: Two times a day (BID) | ORAL | 3 refills | Status: AC

## 2023-09-05 NOTE — Telephone Encounter (Signed)
 Copied from CRM [id number]. Topic: Clinical - Medication Refill >> Sep 05, 2023 10:18 AM [place] A wrote: Medication: diclofenac  (VOLTAREN ) 75 MG EC tablet  Has the patient contacted their pharmacy? No (Agent: If no, request that the patient contact the pharmacy for the refill. If patient does not wish to contact the pharmacy document the reason why and proceed with request.) (Agent: If yes, when and what did the pharmacy advise?)  This is the patient's preferred pharmacy:  [facility] [address], [place] - [address] [place] [address] Phone: [phone] Fax: [phone]  Is this the correct pharmacy for this prescription? Yes If no, delete pharmacy and type the correct one.   Has the prescription been filled recently? No  Is the patient out of the medication? Yes  Has the patient been seen for an appointment in the last year OR does the patient have an upcoming appointment? Yes  Can we respond through MyChart? Yes  Agent: Please be advised that Rx refills may take up to 3 business days. We ask that you follow-up with your pharmacy.

## 2023-09-07 ENCOUNTER — Ambulatory Visit: Attending: Physician Assistant | Admitting: Physician Assistant

## 2023-09-07 ENCOUNTER — Encounter: Payer: Self-pay | Admitting: Physician Assistant

## 2023-09-07 VITALS — BP 112/66 | HR 88 | Ht 67.0 in | Wt 289.0 lb

## 2023-09-07 DIAGNOSIS — E785 Hyperlipidemia, unspecified: Secondary | ICD-10-CM | POA: Insufficient documentation

## 2023-09-07 DIAGNOSIS — I428 Other cardiomyopathies: Secondary | ICD-10-CM | POA: Insufficient documentation

## 2023-09-07 DIAGNOSIS — I493 Ventricular premature depolarization: Secondary | ICD-10-CM | POA: Diagnosis not present

## 2023-09-07 DIAGNOSIS — I10 Essential (primary) hypertension: Secondary | ICD-10-CM | POA: Insufficient documentation

## 2023-09-07 MED ORDER — CARVEDILOL 25 MG PO TABS: 25.0000 mg | ORAL_TABLET | Freq: Two times a day (BID) | ORAL | 3 refills | Status: AC

## 2023-09-07 MED ORDER — LOVASTATIN 10 MG PO TABS: 10.0000 mg | ORAL_TABLET | Freq: Every day | ORAL | 0 refills | Status: DC

## 2023-09-07 MED ORDER — ENALAPRIL MALEATE 10 MG PO TABS: 10.0000 mg | ORAL_TABLET | Freq: Every evening | ORAL | 3 refills | Status: AC

## 2023-09-07 NOTE — Progress Notes (Signed)
 Cardiology Office Note Date:  09/07/2023  Patient ID:  Analeise, Mccleery [birth year], MRN [id number] PCP:  Rollene Almarie LABOR, MD  Cardiologist:  Dr. Rolan (2017) EP: Dr. Kelsie Endo: Dr. Kassie    Chief Complaint:   74mo visit  History of Present Illness: KARLEEN SEEBECK is a 73 y.o. female with history of  hyperthyroidism,  DM, HLD, HTN, CKD (III), morbid obesity. 2011 w/u noted CM > LHC w/normal coronaries.   CM suspect 2/2 hyperthyroidism, and did improve with therapy.   PVCs  She was referred to Dr. Kelsie who felt with minimial symptoms from PVCs and imporvement of her EF, pt preference to avoid invasive procedure, no plans for EP/ablation  She saw A. Kathrine, NP 2018, described as essentially wheelchair bound, no symptoms of PVCs, planned to update her echo, if remained preserved LVEF to continue medical therapy. LVEF was 55-60%  She has seen Dr. Kelsie annually, last via tele health June 2020, doing well, no changes were made, planned for annual APP visit.  I saw her 07/14/2019 She is accompanied by her son today.  She is in a wheelchair, but reports that she is able to ambulates some.  She has bad OA of her hips and knees that is why she uses a wheelchair.  She says she can stand long enough to cook, wash up, able to do her ADLs independently.  She denies any palpitations or awareness of any PVCs if she is having any.  She will rarely get a fleeting/momentary pang in the center of her chest.  maybe the last was 6 mo or so ago.  No rest SOB, no DOE at her level of exertinal.  She denies symptoms of orthopnea or PND.   No dizzy spells, near syncope or syncope.  She has noticed swelling in her feet for the last 3 weeks, it is nearly if not completely resolved in the AM, but then some days uncomfortably swollen.  She notes this is associated with an aching discomfort to the very lateral edges of her feet, like she has been walking on the edge of her feet, or has a rock  bruise  Edema sounded more of a dependent edema, Recommend she take her lasix  QD for 3 days then resume 3d/week, discussed minimizing sodium and elevating feet when seated.  If her lasix  requirements remain increased, will update her echo, she is alsked to let us  know. No PVCs on her EKG.  01/12/20 with me She is accompanied by her son again today She lives in her own home, essentially wheelchair bound for ambulating 2/2 her OA and terrible knee pain, but once in the kitchen stands to cook, do dishes, etc  Able to care for herself. She denies any CP, palpitations or SOB, no cardiac awareness. She denies any symptoms of PND or orthopnea. Tolerating her medicines well No recurrent edema that she noticed Exam was without extrasystoles, no symptoms or exam findings to suggest volume OL or cardiac changes.  Planned for 74mo visit > 1 year if she remained clinically stable cardiac-wise.  I saw her 11/24/20 Again, is accompanied by her son. She is doing well.  Had COVID early this year though did OK. 9mo ago she had a single episode of a fleeting/momentary flutter in her heart beat.  None otherwise with no cardiac awareness She will have a sense of chest heaviness/pulling when not wearing a bra particularly L breast/sided, that is resolved with wearing a bra and knows this  is not her heart. No enar syncope or syncope No edema No changes in her baseline capacity to take care of her self, does self care well, able to get into and out of the chair by her self, just can not ambulate any real distance 2/2 severe OA of her hips/knees Her endocrinologist released her to follow thyroid  with her PMD, she also manages her lipids.  Felt to be doing well, no symptoms or exam findings to suggest any clinical changes, not felt to need a new echo.  Planned for an annual visit  I saw her 01/17/22 (this was her last visit in the office) She is accompanied by her son. She is doing well, very limited physically  with er arthritis unfortunately. No cardiac concerns, no CP, palpitations or cardiac awareness No SOB No near syncope or syncope. When first laying down in bed feels a little dizzy for some reason. She mentions that when she is getting to the time to take her next metoprolol she feels little off, a bit like a slump in energy, she assumes that her medicine is wearing off and will take it early. Her home BP is 120's/60's No PVCs on her EKG No changes were made Planned to transition to Dr. Inocencio  She saw her PMD Dec 2024, orthopedic limitations, discomfort her primary issue it seemed. Full labs done  TODAY  She is accompanied by her son today Her osteoarthritis is very painful, wheelchair very uncomfortable No CP, palpitations or cardiac awareness Says she can tell when she is due for her coreg , tends to feel a bit sluggish when it is due, better after taking it No near syncope or syncope Denies SOB  Her only concern today is that she thinks she may be getting a UTI, no systemic symptoms I have advised she reach out to her PMD   Past Medical History:  Diagnosis Date   ALLERGIC RHINITIS    CARDIOMYOPATHY    Nonischemic. Admitted in 3/11 with CHF exac, due to hyperthyroidism. LHC (03/11) showed clean coronaries with EF 40%. Myoview  showed EF 38%. Echo was a technically difficult study with mild to moderately decreased EF, mild LVH, mild MR. Repeat echo (9/11) with EF 55-60%, mild diastolic dysfunction (grade I).   CARPAL TUNNEL SYNDROME, LEFT, MILD    CKD (chronic kidney disease), stage III (HCC)    Congestive heart failure, unspecified    DIABETES MELLITUS, CONTROLLED    DYSLIPIDEMIA    Graves disease    HYPERTENSION    HYPERTHYROIDISM    Obesity    Osteoarthritis    esp L hip, low back   Palpitations    Premature ventricular contraction     Past Surgical History:  Procedure Laterality Date   CARDIAC CATHETERIZATION  04/19/09   CARDIAC CATHETERIZATION N/A 09/30/2015    Procedure: Left Heart Cath and Coronary Angiography;  Surgeon: Ezra GORMAN Shuck, MD;  Location: [facility] INVASIVE CV LAB;  Service: Cardiovascular;  Laterality: N/A;   CHOLECYSTECTOMY     remote hernia repair      Current Outpatient Medications  Medication Sig Dispense Refill   acetaminophen  (TYLENOL ) 500 MG tablet Take 500 mg by mouth every 6 (six) hours as needed for moderate pain.     aspirin  81 MG tablet Take 81 mg by mouth daily.     blood glucose meter kit and supplies Dispense based on patient and insurance preference. Use up to four times daily as directed. (FOR ICD-10 E10.9, E11.9). 1 each 0   carvedilol  (  COREG ) 25 MG tablet Take 1 tablet (25 mg total) by mouth 2 (two) times daily. 180 tablet 0   CHLORPHENIRAMINE MALEATE PO Take by mouth as needed.     colchicine  0.6 MG tablet Take 0.6 mg by mouth daily as needed. As directed     diclofenac  (VOLTAREN ) 75 MG EC tablet Take 1 tablet (75 mg total) by mouth 2 (two) times daily. 180 tablet 3   enalapril  (VASOTEC ) 10 MG tablet Take 1 tablet (10 mg total) by mouth every evening. 30 tablet 0   furosemide  (LASIX ) 20 MG tablet Take 1 tablet (20 mg total) by mouth 3 (three) times a week. 8 tablet 0   glucose blood (FREESTYLE TEST STRIPS) test strip Use to check blood sugars twice a day 300 each 1   lovastatin  (MEVACOR ) 10 MG tablet Take 1 tablet (10 mg total) by mouth at bedtime. 90 tablet 0   methimazole  (TAPAZOLE ) 5 MG tablet Take 1 tablet by mouth 4 times a week 90 tablet 0   Multiple Vitamin (MULTIVITAMIN) tablet Take 1 tablet by mouth daily.     ondansetron  (ZOFRAN ) 4 MG tablet Take 1 tablet (4 mg total) by mouth every 8 (eight) hours as needed for nausea or vomiting. 20 tablet 0   traMADol  (ULTRAM ) 50 MG tablet Take 1-2 tablets (50-100 mg total) by mouth daily as needed (pain). 60 tablet 5   No current facility-administered medications for this visit.    Allergies:   Latex, Wool alcohol [lanolin], and Amitriptyline    Social History:  The  patient  reports that she has never smoked. She has never been exposed to tobacco smoke. She has never used smokeless tobacco. She reports that she does not drink alcohol and does not use drugs.   Family History:  The patient's family history includes Asthma in an other family member; Breast cancer in her mother; Cervical cancer in her mother; Diabetes in her mother; Kidney cancer in her father; Stroke in her father; Supraventricular tachycardia in her sister.  ROS:  Please see the history of present illness.  All other systems are reviewed and otherwise negative.   PHYSICAL EXAM:  VS:  There were no vitals taken for this visit. BMI: There is no height or weight on file to calculate BMI. Well nourished, well developed, in no acute distress  HEENT: normocephalic, atraumatic  Neck: no JVD, carotid bruits or masses Cardiac:  RRR; no significant murmurs, no rubs, or gallops,  no extrasystoles noted with prolonged auscultation Lungs:   CTA b/l, no wheezing, rhonchi or rales  Abd: soft, nontender,  she has a very large pannus MS: no deformity or atrophy Ext:  no edema b/l Skin: warm and dry, no rash Neuro:  No gross deficits appreciated Psych: euthymic mood, full affect   EKG: done today and reviewed by myself SR 84bpm, PVCs (3)  SR 68bpm , normal intervals, no PVCs  03/17/20 EKGs previously reviewed:  SR 68bpm, no PVCs SR 79bpm, 2 PVCs  05/11/2016: TTE Study Conclusions  - Left ventricle: The cavity size was normal. Systolic function was    normal. The estimated ejection fraction was in the range of 55%    to 60%. Wall motion was normal; there were no regional wall    motion abnormalities. Doppler parameters are consistent with    abnormal left ventricular relaxation (grade 1 diastolic    dysfunction). Doppler parameters are consistent with elevated    ventricular end-diastolic filling pressure. Mean left atrial    pressure  appears to be normal at rest.    09/30/2015: LHC No  obstructive coronary disease.  Small caliber distal LAD but similar to prior study.    Recent Labs: 01/25/2023: ALT 9; BUN 28; Creatinine, Ser 1.18; Hemoglobin 11.4; Platelets 272.0; Potassium 5.2 No hemolysis seen; Sodium 139; TSH 2.51  01/25/2023: Cholesterol 112; HDL 46.00; LDL Cholesterol 55; Total CHOL/HDL Ratio 2; Triglycerides 55.0; VLDL 11.0   CrCl cannot be calculated (Patient's most recent lab result is older than the maximum 21 days allowed.).   Wt Readings from Last 3 Encounters:  05/17/23 275 lb (124.7 kg)  01/25/23 275 lb (124.7 kg)  01/17/22 (!) 301 lb (136.5 kg)     Other studies reviewed: Additional studies/records reviewed today include: summarized above  ASSESSMENT AND PLAN:  1. PVCs     No symptoms to suggest any significant burden/or clinical change     She had a few on her EKG though not a single extrasystole during a prolonged auscultation  2. NICM     Recovered LVEF, suspect 2/2 hyperthyroidism     No symptoms or exam findings of volume OL  3. HTN     Looks good   4.  HLD      She needs refill, says we have been doing them      Advised I would provide one refill > further via her PMD  Disposition: she is aware of need for new MD, will have her see Dr. Almetta, though think we could refer to gen cardiology team at that juncture if clinically unchanged    Current medicines are reviewed at length with the patient today.  The patient did not have any concerns regarding medicines  Signed, Charlies Arthur, PA-C 09/07/2023 7:32 AM     [facility] [address] [place] [address] [phone] (office)  [phone] (fax)

## 2023-09-07 NOTE — Patient Instructions (Signed)
 Medication Instructions:   Your physician recommends that you continue on your current medications as directed. Please refer to the Current Medication list given to you today.   *If you need a refill on your cardiac medications before your next appointment, please call your pharmacy*    Lab Work:  NONE ORDERED  TODAY     If you have labs (blood work) drawn today and your tests are completely normal, you will receive your results only by: MyChart Message (if you have MyChart) OR A paper copy in the mail If you have any lab test that is abnormal or we need to change your treatment, we will call you to review the results.   Testing/Procedures: NONE ORDERED  TODAY     Follow-Up: At [facility], you and your health needs are our priority.  As part of our continuing mission to provide you with exceptional heart care, our providers are all part of one team.  This team includes your primary Cardiologist (physician) and Advanced Practice Providers or APPs (Physician Assistants and Nurse Practitioners) who all work together to provide you with the care you need, when you need it.  Your next appointment:    1 year(s)   Provider:   Dr Almetta    We recommend signing up for the patient portal called MyChart.  Sign up information is provided on this After Visit Summary.  MyChart is used to connect with patients for Virtual Visits (Telemedicine).  Patients are able to view lab/test results, encounter notes, upcoming appointments, etc.  Non-urgent messages can be sent to your provider as well.   To learn more about what you can do with MyChart, go to [email or web address].   Other Instructions

## 2023-09-25 ENCOUNTER — Encounter: Payer: Self-pay | Admitting: Internal Medicine

## 2023-09-26 NOTE — Telephone Encounter (Signed)
 Ok to refill

## 2023-09-27 ENCOUNTER — Other Ambulatory Visit: Payer: Self-pay

## 2023-09-27 DIAGNOSIS — I428 Other cardiomyopathies: Secondary | ICD-10-CM

## 2023-09-27 DIAGNOSIS — I10 Essential (primary) hypertension: Secondary | ICD-10-CM

## 2023-09-27 MED ORDER — LOVASTATIN 10 MG PO TABS: 10.0000 mg | ORAL_TABLET | Freq: Every day | ORAL | 0 refills | Status: DC

## 2023-09-27 NOTE — Telephone Encounter (Signed)
 I have sent in 90 no refills

## 2023-10-01 ENCOUNTER — Encounter: Payer: Self-pay | Admitting: Internal Medicine

## 2023-11-08 ENCOUNTER — Ambulatory Visit: Payer: Self-pay

## 2023-11-08 NOTE — Telephone Encounter (Signed)
 FYI Only or Action Required?: FYI only for provider.  Patient was last seen in primary care on 01/25/2023 by Rollene Almarie LABOR, MD.  Called Nurse Triage reporting Dysuria and a sore  that will not heal.  Symptoms began several months ago.  Interventions attempted: Other: AZO, ointments.  Symptoms are: unchanged.  Triage Disposition: See Physician Within 24 Hours  Patient/caregiver understands and will follow disposition?: Yes                         Copied from CRM #[id number]. Topic: Clinical - Red Word Triage >> Nov 08, 2023 10:33 AM Dedra NOVAK wrote: [facility] that prompted transfer to Nurse Triage: Pt thinks she a uti. Pt has burning with urination and sores around the vaginal and anal area. Warm transfer to NT. Reason for Disposition  Age > 50 years  Answer Assessment - Initial Assessment Questions 1. SEVERITY: How bad is the pain?  (e.g., Scale 1-10; mild, moderate, or severe)     Moderate - it will sting      3. PATTERN: Is pain present every time you urinate or just sometimes?      Just sometimes 4. ONSET: When did the painful urination start?      1 month 5. FEVER: Do you have a fever? If Yes, ask: What is your temperature, how was it measured, and when did it start?     no 6. PAST UTI: Have you had a urine infection before? If Yes, ask: When was the last time? and What happened that time?      no 7. CAUSE: What do you think is causing the painful urination?  (e.g., UTI, scratch, Herpes sore)     Unsure if it is UTI ir sore 8. OTHER SYMPTOMS: Do you have any other symptoms? (e.g., blood in urine, flank pain, genital sores, urgency, vaginal discharge)     Sores around vaginal area - Sore near anus. Wheel chair bound  Protocols used: Urination Pain - Female-A-AH

## 2023-11-08 NOTE — Telephone Encounter (Signed)
 Please advise as md is out of office

## 2023-11-09 ENCOUNTER — Encounter: Payer: Self-pay | Admitting: Family

## 2023-11-09 ENCOUNTER — Ambulatory Visit: Admitting: Family

## 2023-11-09 VITALS — BP 124/70 | HR 92 | Temp 98.2°F | Ht 67.0 in

## 2023-11-09 DIAGNOSIS — L89322 Pressure ulcer of left buttock, stage 2: Secondary | ICD-10-CM | POA: Diagnosis not present

## 2023-11-09 DIAGNOSIS — R300 Dysuria: Secondary | ICD-10-CM

## 2023-11-09 MED ORDER — CEPHALEXIN 500 MG PO CAPS: 500.0000 mg | ORAL_CAPSULE | Freq: Three times a day (TID) | ORAL | 0 refills | Status: DC

## 2023-11-09 NOTE — Progress Notes (Signed)
 Acute Office Visit  Subjective:     Patient ID: Katie Higgins, female    DOB: 1950/12/17, 73 y.o.   MRN: [id number]  Chief Complaint  Patient presents with  . Urinary Tract Infection    Having burning and irritated, back pain , all started a month ago    HPI Patient is in today with c/o burning to an open skin area on her buttocks when she urinates. She has been attempting to treat the discomfort with Azo, corn  starch, and balmex that has not helped.She denies any burning when urine leaves her vagina, only when it hits her skin. No pelvic pain or back pain.   Review of Systems  Gastrointestinal:  Negative for abdominal pain, nausea and vomiting.  Genitourinary:  Positive for dysuria. Negative for flank pain, frequency and hematuria.  Skin:        Skin wound on buttocks  All other systems reviewed and are negative.  Past Medical History:  Diagnosis Date  . ALLERGIC RHINITIS   . CARDIOMYOPATHY    Nonischemic. Admitted in 3/11 with CHF exac, due to hyperthyroidism. LHC (03/11) showed clean coronaries with EF 40%. Myoview  showed EF 38%. Echo was a technically difficult study with mild to moderately decreased EF, mild LVH, mild MR. Repeat echo (9/11) with EF 55-60%, mild diastolic dysfunction (grade I).  . CARPAL TUNNEL SYNDROME, LEFT, MILD   . CKD (chronic kidney disease), stage III (HCC)   . Congestive heart failure, unspecified   . DIABETES MELLITUS, CONTROLLED   . DYSLIPIDEMIA   . Graves disease   . HYPERTENSION   . HYPERTHYROIDISM   . Obesity   . Osteoarthritis    esp L hip, low back  . Palpitations   . Premature ventricular contraction     Social History   Socioeconomic History  . Marital status: Widowed    Spouse name: Not on file  . Number of children: Not on file  . Years of education: Not on file  . Highest education level: Not on file  Occupational History  . Not on file  Tobacco Use  . Smoking status: Never    Passive exposure: Never  . Smokeless  tobacco: Never  Vaping Use  . Vaping status: Never Used  Substance and Sexual Activity  . Alcohol use: No  . Drug use: No  . Sexual activity: Not on file  Other Topics Concern  . Not on file  Social History Narrative   Lives in [place] with her son.   Widowed since 1992.   She is very sedentary, uses a wheelchair when out of the house.   Ambulatory in home with cane.   Social Drivers of Corporate investment banker Strain: Low Risk  (05/17/2023)   Overall Financial Resource Strain (CARDIA)   . Difficulty of Paying Living Expenses: Not hard at all  Food Insecurity: No Food Insecurity (05/17/2023)   Hunger Vital Sign   . Worried About Programme researcher, broadcasting/film/video in the Last Year: Never true   . Ran Out of Food in the Last Year: Never true  Transportation Needs: No Transportation Needs (05/17/2023)   PRAPARE - Transportation   . Lack of Transportation (Medical): No   . Lack of Transportation (Non-Medical): No  Physical Activity: Insufficiently Active (05/17/2023)   Exercise Vital Sign   . Days of Exercise per Week: 5 days   . Minutes of Exercise per Session: 10 min  Stress: No Stress Concern Present (05/17/2023)   [employer]  of Occupational Health - Occupational Stress Questionnaire   . Feeling of Stress : Not at all  Social Connections: Socially Isolated (05/17/2023)   Social Connection and Isolation Panel   . Frequency of Communication with Friends and Family: More than three times a week   . Frequency of Social Gatherings with Friends and Family: Once a week   . Attends Religious Services: Never   . Active Member of Clubs or Organizations: No   . Attends Banker Meetings: Never   . Marital Status: Widowed  Intimate Partner Violence: Not At Risk (05/17/2023)   Humiliation, Afraid, Rape, and Kick questionnaire   . Fear of Current or Ex-Partner: No   . Emotionally Abused: No   . Physically Abused: No   . Sexually Abused: No    Past Surgical History:  Procedure  Laterality Date  . CARDIAC CATHETERIZATION  04/19/09  . CARDIAC CATHETERIZATION N/A 09/30/2015   Procedure: Left Heart Cath and Coronary Angiography;  Surgeon: Ezra GORMAN Shuck, MD;  Location: [facility] INVASIVE CV LAB;  Service: Cardiovascular;  Laterality: N/A;  . CHOLECYSTECTOMY    . remote hernia repair      Family History  Problem Relation Age of Onset  . Diabetes Mother   . Breast cancer Mother   . Cervical cancer Mother   . Kidney cancer Father        Kidney Cancer  . Stroke Father        CVA  . Supraventricular tachycardia Sister   . Asthma Other        family history    Allergies  Allergen Reactions  . Latex Rash  . Wool Alcohol [Lanolin] Rash  . Amitriptyline  Other (See Comments)    Leg cramps, trimmers     Current Outpatient Medications on File Prior to Visit  Medication Sig Dispense Refill  . acetaminophen  (TYLENOL ) 500 MG tablet Take 500 mg by mouth every 6 (six) hours as needed for moderate pain.    . aspirin  81 MG tablet Take 81 mg by mouth daily.    . blood glucose meter kit and supplies Dispense based on patient and insurance preference. Use up to four times daily as directed. (FOR ICD-10 E10.9, E11.9). 1 each 0  . carvedilol  (COREG ) 25 MG tablet Take 1 tablet (25 mg total) by mouth 2 (two) times daily. 180 tablet 3  . CHLORPHENIRAMINE MALEATE PO Take by mouth as needed.    . colchicine  0.6 MG tablet Take 0.6 mg by mouth daily as needed. As directed    . diclofenac  (VOLTAREN ) 75 MG EC tablet Take 1 tablet (75 mg total) by mouth 2 (two) times daily. 180 tablet 3  . enalapril  (VASOTEC ) 10 MG tablet Take 1 tablet (10 mg total) by mouth every evening. 90 tablet 3  . furosemide  (LASIX ) 20 MG tablet Take 1 tablet (20 mg total) by mouth 3 (three) times a week. 8 tablet 0  . glucose blood (FREESTYLE TEST STRIPS) test strip Use to check blood sugars twice a day 300 each 1  . lovastatin  (MEVACOR ) 10 MG tablet Take 1 tablet (10 mg total) by mouth at bedtime. 90 tablet 0  .  methimazole  (TAPAZOLE ) 5 MG tablet Take 1 tablet by mouth 4 times a week 90 tablet 0  . Multiple Vitamin (MULTIVITAMIN) tablet Take 1 tablet by mouth daily.    . ondansetron  (ZOFRAN ) 4 MG tablet Take 1 tablet (4 mg total) by mouth every 8 (eight) hours as needed for nausea or vomiting.  20 tablet 0  . traMADol  (ULTRAM ) 50 MG tablet Take 1-2 tablets (50-100 mg total) by mouth daily as needed (pain). 60 tablet 5  . [DISCONTINUED] cyclobenzaprine  (FLEXERIL ) 5 MG tablet Take 1 tablet (5 mg total) by mouth 3 (three) times daily as needed for muscle spasms. 30 tablet 0   No current facility-administered medications on file prior to visit.    BP 124/70 (BP Location: Left Arm, Patient Position: Sitting, Cuff Size: Normal)   Pulse 92   Temp 98.2 F (36.8 C) (Oral)   Ht 5' 7 (1.702 m)   SpO2 95%   BMI 45.26 kg/m chart      Objective:    BP 124/70 (BP Location: Left Arm, Patient Position: Sitting, Cuff Size: Normal)   Pulse 92   Temp 98.2 F (36.8 C) (Oral)   Ht 5' 7 (1.702 m)   SpO2 95%   BMI 45.26 kg/m    Physical Exam Vitals and nursing note reviewed.  Constitutional:      Appearance: Normal appearance. She is obese.  Cardiovascular:     Rate and Rhythm: Normal rate and regular rhythm.     Pulses: Normal pulses.     Heart sounds: Normal heart sounds.  Pulmonary:     Effort: Pulmonary effort is normal.     Breath sounds: Normal breath sounds.  Skin:        Comments: Stage 2 pressure ulcer noted to the buttocks. Approximately 1.5 cm  Neurological:     General: No focal deficit present.     Mental Status: She is alert and oriented to person, place, and time. Mental status is at baseline.  Psychiatric:        Mood and Affect: Mood normal.        Behavior: Behavior normal.        Thought Content: Thought content normal.    No results found for any visits on 11/09/23.      Assessment & Plan:   Problem List Items Addressed This Visit   None Visit Diagnoses        Pressure injury of left buttock, stage 2 (HCC)    -  Primary   Relevant Orders   Ambulatory referral to Wound Clinic     Dysuria           Meds ordered this encounter  Medications  . cephALEXin (KEFLEX) 500 MG capsule    Sig: Take 1 capsule (500 mg total) by mouth 3 (three) times daily.    Dispense:  21 capsule    Refill:  0   Patient unable to leave a urine specimen. Since the stage 2 pressure sore is open and redden, will cover for infection (urine/skin) with Cephalexin.Refer to wound care clinic for management of wound. Call the office if symptoms worsen or persist. Recheck as scheduled and as needed.  No follow-ups on file.  Calix Heinbaugh B Kirill Chatterjee, FNP

## 2024-02-06 ENCOUNTER — Ambulatory Visit: Admitting: Internal Medicine

## 2024-02-06 ENCOUNTER — Encounter: Payer: Self-pay | Admitting: Internal Medicine

## 2024-02-06 VITALS — BP 118/80 | HR 74 | Temp 97.9°F | Ht 67.0 in

## 2024-02-06 DIAGNOSIS — G894 Chronic pain syndrome: Secondary | ICD-10-CM | POA: Diagnosis not present

## 2024-02-06 DIAGNOSIS — R52 Pain, unspecified: Secondary | ICD-10-CM

## 2024-02-06 DIAGNOSIS — N1831 Chronic kidney disease, stage 3a: Secondary | ICD-10-CM

## 2024-02-06 DIAGNOSIS — E782 Mixed hyperlipidemia: Secondary | ICD-10-CM

## 2024-02-06 DIAGNOSIS — I10 Essential (primary) hypertension: Secondary | ICD-10-CM | POA: Diagnosis not present

## 2024-02-06 DIAGNOSIS — F1120 Opioid dependence, uncomplicated: Secondary | ICD-10-CM | POA: Diagnosis not present

## 2024-02-06 DIAGNOSIS — Z8639 Personal history of other endocrine, nutritional and metabolic disease: Secondary | ICD-10-CM

## 2024-02-06 DIAGNOSIS — R309 Painful micturition, unspecified: Secondary | ICD-10-CM

## 2024-02-06 DIAGNOSIS — E041 Nontoxic single thyroid nodule: Secondary | ICD-10-CM | POA: Diagnosis not present

## 2024-02-06 DIAGNOSIS — I5032 Chronic diastolic (congestive) heart failure: Secondary | ICD-10-CM | POA: Diagnosis not present

## 2024-02-06 DIAGNOSIS — Z23 Encounter for immunization: Secondary | ICD-10-CM | POA: Diagnosis not present

## 2024-02-06 DIAGNOSIS — M1712 Unilateral primary osteoarthritis, left knee: Secondary | ICD-10-CM

## 2024-02-06 DIAGNOSIS — Z6841 Body Mass Index (BMI) 40.0 and over, adult: Secondary | ICD-10-CM | POA: Diagnosis not present

## 2024-02-06 LAB — POC URINALSYSI DIPSTICK (AUTOMATED)
Bilirubin, UA: NEGATIVE
Glucose, UA: NEGATIVE
Ketones, UA: NEGATIVE
Nitrite, UA: NEGATIVE
Protein, UA: NEGATIVE
Spec Grav, UA: 1.015
Urobilinogen, UA: 0.2 U/dL
pH, UA: 5.5

## 2024-02-06 LAB — COMPREHENSIVE METABOLIC PANEL WITH GFR
ALT: 8 U/L (ref 3–35)
AST: 9 U/L (ref 5–37)
Albumin: 3.9 g/dL (ref 3.5–5.2)
Alkaline Phosphatase: 54 U/L (ref 39–117)
BUN: 42 mg/dL — ABNORMAL HIGH (ref 6–23)
CO2: 25 meq/L (ref 19–32)
Calcium: 8.4 mg/dL (ref 8.4–10.5)
Chloride: 108 meq/L (ref 96–112)
Creatinine, Ser: 1.28 mg/dL — ABNORMAL HIGH (ref 0.40–1.20)
GFR: 41.31 mL/min — ABNORMAL LOW
Glucose, Bld: 114 mg/dL — ABNORMAL HIGH (ref 70–99)
Potassium: 4.7 meq/L (ref 3.5–5.1)
Sodium: 140 meq/L (ref 135–145)
Total Bilirubin: 0.6 mg/dL (ref 0.2–1.2)
Total Protein: 6.8 g/dL (ref 6.0–8.3)

## 2024-02-06 LAB — CBC
HCT: 33.6 % — ABNORMAL LOW (ref 36.0–46.0)
Hemoglobin: 11.2 g/dL — ABNORMAL LOW (ref 12.0–15.0)
MCHC: 33.4 g/dL (ref 30.0–36.0)
MCV: 89.5 fl (ref 78.0–100.0)
Platelets: 253 K/uL (ref 150.0–400.0)
RBC: 3.75 Mil/uL — ABNORMAL LOW (ref 3.87–5.11)
RDW: 14.2 % (ref 11.5–15.5)
WBC: 7.1 K/uL (ref 4.0–10.5)

## 2024-02-06 LAB — MICROALBUMIN / CREATININE URINE RATIO
Creatinine,U: 74.5 mg/dL
Microalb Creat Ratio: UNDETERMINED mg/g (ref 0.0–30.0)
Microalb, Ur: <0.7 mg/dL

## 2024-02-06 LAB — TSH: TSH: 1.22 u[IU]/mL (ref 0.35–5.50)

## 2024-02-06 LAB — LIPID PANEL
Cholesterol: 107 mg/dL (ref 28–200)
HDL: 45.1 mg/dL
LDL Cholesterol: 50 mg/dL (ref 10–99)
NonHDL: 61.52
Total CHOL/HDL Ratio: 2
Triglycerides: 56 mg/dL (ref 10.0–149.0)
VLDL: 11.2 mg/dL (ref 0.0–40.0)

## 2024-02-06 LAB — HEMOGLOBIN A1C: Hgb A1c MFr Bld: 5.4 % (ref 4.6–6.5)

## 2024-02-06 MED ORDER — TRAMADOL HCL 50 MG PO TABS: 50.0000 mg | ORAL_TABLET | Freq: Every day | ORAL | 5 refills | Status: AC | PRN

## 2024-02-06 MED ORDER — NITROFURANTOIN MONOHYD MACRO 100 MG PO CAPS: 100.0000 mg | ORAL_CAPSULE | Freq: Two times a day (BID) | ORAL | 0 refills | Status: AC

## 2024-02-06 NOTE — Progress Notes (Signed)
 "  Subjective:   Patient ID: Katie Higgins, female    DOB: [birth year], 73 y.o.   MRN: [id number]  Discussed the use of AI scribe software for clinical note transcription with the patient, who gave verbal consent to proceed.  History of Present Illness Katie Higgins is a 73 year old female with osteoarthritis who presents with left shoulder pain and suspected urinary tract infection.  She has been experiencing left shoulder pain for the past two to three weeks, with radiation down the arm. The pain is intermittent, with varying intensity, and she suspects it might be due to a pinched nerve from frequent arm use. No new chest pain, tightness, or pressure, but there is occasional muscle pain in the right breast. Her osteoarthritis affects her hips and knees, and she wonders if it has now affected her shoulder. She uses her arms frequently to assist with mobility due to being in a wheelchair.  She does have chronic pain and uses her tramadol  for pain as prescribed.   She reports discomfort with urination and a weak bladder. A previous one-week course of antibiotics provided initial relief, but she believes the infection may have recurred. She experiences urgency and occasional diarrhea, which she attributes to her wheelchair use. Her bladder has become weaker over time, necessitating more urgent urination than before.  Review of Systems  Constitutional:  Positive for activity change and fatigue. Negative for appetite change.  HENT: Negative.    Eyes: Negative.   Respiratory: Negative.  Negative for cough, chest tightness and shortness of breath.   Cardiovascular: Negative.  Negative for chest pain, palpitations and leg swelling.  Gastrointestinal:  Negative for abdominal distention, abdominal pain, constipation, diarrhea, nausea and vomiting.  Genitourinary:  Positive for dysuria and frequency. Negative for urgency.  Musculoskeletal:  Positive for arthralgias, back pain and myalgias.  Skin:  Negative.   Neurological: Negative.   Psychiatric/Behavioral: Negative.      Objective:  Physical Exam Constitutional:      Appearance: She is well-developed.  HENT:     Head: Normocephalic and atraumatic.  Cardiovascular:     Rate and Rhythm: Normal rate and regular rhythm.  Pulmonary:     Effort: Pulmonary effort is normal. No respiratory distress.     Breath sounds: Normal breath sounds. No wheezing or rales.  Abdominal:     General: Bowel sounds are normal. There is no distension.     Palpations: Abdomen is soft.     Tenderness: There is no abdominal tenderness.  Musculoskeletal:     Cervical back: Normal range of motion.  Skin:    General: Skin is warm and dry.     Comments: Foot exam done  Neurological:     Mental Status: She is alert and oriented to person, place, and time.     Coordination: Coordination normal.     Vitals:   02/06/24 1038  BP: 118/80  Pulse: 74  Temp: 97.9 F (36.6 C)  TempSrc: Oral  SpO2: 97%  Height: 5' 7 (1.702 m)   Flu shot given at visit  Assessment and Plan Assessment & Plan Urinary tract infection   Recurrent UTI with dysuria and urgency due to insufficient previous antibiotic course. Prescribed a 10-day antibiotic course and sent the prescription to [facility] on [address] for immediate pickup.  Left shoulder pain with possible cervical radiculopathy   Left shoulder pain radiates down the arm, possibly due to cervical radiculopathy. Symptoms include hand numbness with variable severity over 2-3 weeks. Differential  diagnosis includes a pinched nerve and osteoarthritis migration.  Osteoarthritis of hips and knees   Chronic osteoarthritis in hips and knees may contribute to shoulder pain due to compensatory arm use.  Hyperlipidemia Lipid panel ordered today and adjust regimen as needed  Chronic pain PDMP reviewed and appropriate. Checking UDS. Refill tramadol  and visit every 6 months.   Thyroid  nodule   Thyroid  medication  discontinued by endocrinologist. Thyroid  function will be monitored. Ordered blood work to assess thyroid  function.  Hx diabetes Sugars consistently in pre-diabetes range without medication for some time. Foot exam and UACR and Hga1c done today.    "

## 2024-02-06 NOTE — Assessment & Plan Note (Signed)
 Checking CMP and lipid panel and adjust as needed regimen.

## 2024-02-06 NOTE — Assessment & Plan Note (Signed)
Refilled tramadol today.

## 2024-02-06 NOTE — Patient Instructions (Signed)
 We have sent in the macrobid which is the antibiotic for the urine infection. Take 1 pill twice a day for 10 days.  We will check the labs today.

## 2024-02-06 NOTE — Assessment & Plan Note (Signed)
 Taking lasix  and no change in SOB. No flare today and continue current regimen.

## 2024-02-06 NOTE — Assessment & Plan Note (Signed)
 BMI > 40 and counseled about diet unable to exercise.

## 2024-02-06 NOTE — Assessment & Plan Note (Signed)
 PDMP reviewed and appropriate. Checking UDS. Refill tramadol  and visit every 6 months.

## 2024-02-06 NOTE — Assessment & Plan Note (Signed)
 Lipid panel ordered today and adjust regimen as needed

## 2024-02-06 NOTE — Assessment & Plan Note (Signed)
 Checking TSH and adjust as needed. Not on meds for some time.

## 2024-02-06 NOTE — Assessment & Plan Note (Signed)
 Checking CMP and adjust as needed.

## 2024-02-06 NOTE — Assessment & Plan Note (Signed)
 PDMP reviewed and appropriate. Checking UDS. Refill tramadol  50 mg BID prn and visit every 6 months.

## 2024-02-06 NOTE — Assessment & Plan Note (Signed)
 POC U/A done and consistent with infection. Rx macrobid 10 day course.

## 2024-02-06 NOTE — Assessment & Plan Note (Signed)
 Sugars consistently in pre-diabetes range without medication for some time. Foot exam and UACR and Hga1c done today.

## 2024-02-08 ENCOUNTER — Ambulatory Visit: Payer: Self-pay | Admitting: Internal Medicine

## 2024-02-08 NOTE — Addendum Note (Signed)
 Addended by: EZZARD EDSEL HERO on: 02/08/2024 09:01 AM   Modules accepted: Orders

## 2024-02-09 LAB — URINE DRUGS OF ABUSE SCREEN W ALC, ROUTINE (REF LAB)
Amphetamines, Urine: NEGATIVE ng/mL
Barbiturate Quant, Ur: NEGATIVE ng/mL
Benzodiazepine Quant, Ur: NEGATIVE ng/mL
Cannabinoid Quant, Ur: NEGATIVE ng/mL
Cocaine (Metab.): NEGATIVE ng/mL
Creatinine, Urine: 77.3 mg/dL (ref 20.0–300.0)
Ethanol, Urine: NEGATIVE %
Methadone Screen, Urine: NEGATIVE ng/mL
Nitrite Urine, Quantitative: NEGATIVE ug/mL
OPIATE SCREEN URINE: NEGATIVE ng/mL
PCP Quant, Ur: NEGATIVE ng/mL
Propoxyphene: NEGATIVE ng/mL
pH, Urine: 5 (ref 4.5–8.9)

## 2024-02-11 MED ORDER — ONDANSETRON HCL 4 MG PO TABS: 4.0000 mg | ORAL_TABLET | Freq: Three times a day (TID) | ORAL | 0 refills | Status: AC | PRN

## 2024-02-20 ENCOUNTER — Encounter: Payer: Self-pay | Admitting: Internal Medicine

## 2024-02-20 DIAGNOSIS — I428 Other cardiomyopathies: Secondary | ICD-10-CM

## 2024-02-20 DIAGNOSIS — I10 Essential (primary) hypertension: Secondary | ICD-10-CM

## 2024-02-20 MED ORDER — LOVASTATIN 10 MG PO TABS: 10.0000 mg | ORAL_TABLET | Freq: Every day | ORAL | 3 refills | Status: AC

## 2024-05-20 ENCOUNTER — Ambulatory Visit

## 2024-06-20 ENCOUNTER — Ambulatory Visit
